# Patient Record
Sex: Female | Born: 1950
Health system: Southern US, Community
[De-identification: ages and names within clinical notes are randomized; demographics above are authoritative.]

## PROBLEM LIST (undated history)

## (undated) DIAGNOSIS — J189 Pneumonia, unspecified organism: Secondary | ICD-10-CM

## (undated) DIAGNOSIS — J42 Unspecified chronic bronchitis: Secondary | ICD-10-CM

## (undated) DIAGNOSIS — C801 Malignant (primary) neoplasm, unspecified: Secondary | ICD-10-CM

## (undated) DIAGNOSIS — I10 Essential (primary) hypertension: Secondary | ICD-10-CM

## (undated) DIAGNOSIS — M1990 Unspecified osteoarthritis, unspecified site: Secondary | ICD-10-CM

## (undated) DIAGNOSIS — C4491 Basal cell carcinoma of skin, unspecified: Secondary | ICD-10-CM

## (undated) DIAGNOSIS — Z9889 Other specified postprocedural states: Secondary | ICD-10-CM

## (undated) DIAGNOSIS — K589 Irritable bowel syndrome without diarrhea: Secondary | ICD-10-CM

## (undated) DIAGNOSIS — R112 Nausea with vomiting, unspecified: Secondary | ICD-10-CM

## (undated) DIAGNOSIS — D219 Benign neoplasm of connective and other soft tissue, unspecified: Secondary | ICD-10-CM

## (undated) DIAGNOSIS — H269 Unspecified cataract: Secondary | ICD-10-CM

## (undated) DIAGNOSIS — M069 Rheumatoid arthritis, unspecified: Secondary | ICD-10-CM

## (undated) HISTORY — DX: Irritable bowel syndrome, unspecified: K58.9

## (undated) HISTORY — DX: Benign neoplasm of connective and other soft tissue, unspecified: D21.9

## (undated) HISTORY — DX: Essential (primary) hypertension: I10

## (undated) HISTORY — DX: Unspecified cataract: H26.9

## (undated) HISTORY — PX: BREAST SURGERY: SHX581

## (undated) HISTORY — PX: KNEE ARTHROSCOPY: SHX127

## (undated) HISTORY — DX: Unspecified osteoarthritis, unspecified site: M19.90

## (undated) HISTORY — PX: FOOT SURGERY: SHX648

## (undated) HISTORY — PX: BASAL CELL CARCINOMA EXCISION: SHX1214

## (undated) HISTORY — PX: COLONOSCOPY: SHX174

## (undated) HISTORY — DX: Malignant (primary) neoplasm, unspecified: C80.1

## (undated) HISTORY — DX: Rheumatoid arthritis, unspecified: M06.9

## (undated) HISTORY — PX: EYE SURGERY: SHX253

## (undated) HISTORY — PX: PELVIC LAPAROSCOPY: SHX162

---

## 1979-05-27 HISTORY — PX: KNEE ARTHROSCOPY: SHX127

## 1990-09-25 HISTORY — PX: BREAST CYST EXCISION: SHX579

## 1995-09-26 HISTORY — PX: TOTAL ABDOMINAL HYSTERECTOMY: SHX209

## 1998-09-08 ENCOUNTER — Other Ambulatory Visit: Admission: RE | Admit: 1998-09-08 | Discharge: 1998-09-08 | Payer: Self-pay | Admitting: Radiology

## 1998-10-04 ENCOUNTER — Other Ambulatory Visit: Admission: RE | Admit: 1998-10-04 | Discharge: 1998-10-04 | Payer: Self-pay | Admitting: Otolaryngology

## 1998-11-22 ENCOUNTER — Other Ambulatory Visit: Admission: RE | Admit: 1998-11-22 | Discharge: 1998-11-22 | Payer: Self-pay | Admitting: Obstetrics and Gynecology

## 1999-12-28 ENCOUNTER — Other Ambulatory Visit: Admission: RE | Admit: 1999-12-28 | Discharge: 1999-12-28 | Payer: Self-pay | Admitting: Obstetrics and Gynecology

## 2000-10-23 ENCOUNTER — Encounter: Payer: Self-pay | Admitting: Internal Medicine

## 2001-06-27 ENCOUNTER — Other Ambulatory Visit: Admission: RE | Admit: 2001-06-27 | Discharge: 2001-06-27 | Payer: Self-pay | Admitting: Obstetrics and Gynecology

## 2002-09-23 ENCOUNTER — Other Ambulatory Visit: Admission: RE | Admit: 2002-09-23 | Discharge: 2002-09-23 | Payer: Self-pay | Admitting: Obstetrics and Gynecology

## 2004-07-25 ENCOUNTER — Other Ambulatory Visit: Admission: RE | Admit: 2004-07-25 | Discharge: 2004-07-25 | Payer: Self-pay | Admitting: Obstetrics and Gynecology

## 2005-07-31 ENCOUNTER — Other Ambulatory Visit: Admission: RE | Admit: 2005-07-31 | Discharge: 2005-07-31 | Payer: Self-pay | Admitting: Obstetrics and Gynecology

## 2005-08-02 ENCOUNTER — Ambulatory Visit: Payer: Self-pay | Admitting: Internal Medicine

## 2006-09-24 ENCOUNTER — Other Ambulatory Visit: Admission: RE | Admit: 2006-09-24 | Discharge: 2006-09-24 | Payer: Self-pay | Admitting: Obstetrics and Gynecology

## 2007-09-26 HISTORY — PX: JOINT REPLACEMENT: SHX530

## 2007-10-21 ENCOUNTER — Other Ambulatory Visit: Admission: RE | Admit: 2007-10-21 | Discharge: 2007-10-21 | Payer: Self-pay | Admitting: Obstetrics and Gynecology

## 2007-12-03 ENCOUNTER — Ambulatory Visit (HOSPITAL_BASED_OUTPATIENT_CLINIC_OR_DEPARTMENT_OTHER): Admission: RE | Admit: 2007-12-03 | Discharge: 2007-12-04 | Payer: Self-pay | Admitting: Orthopedic Surgery

## 2007-12-03 ENCOUNTER — Encounter (INDEPENDENT_AMBULATORY_CARE_PROVIDER_SITE_OTHER): Payer: Self-pay | Admitting: Orthopedic Surgery

## 2008-11-23 ENCOUNTER — Other Ambulatory Visit: Admission: RE | Admit: 2008-11-23 | Discharge: 2008-11-23 | Payer: Self-pay | Admitting: Obstetrics and Gynecology

## 2008-11-23 ENCOUNTER — Ambulatory Visit: Payer: Self-pay | Admitting: Obstetrics and Gynecology

## 2008-11-23 ENCOUNTER — Encounter: Payer: Self-pay | Admitting: Obstetrics and Gynecology

## 2009-07-26 ENCOUNTER — Encounter (INDEPENDENT_AMBULATORY_CARE_PROVIDER_SITE_OTHER): Payer: Self-pay | Admitting: *Deleted

## 2009-07-26 ENCOUNTER — Telehealth: Payer: Self-pay | Admitting: Internal Medicine

## 2009-07-30 ENCOUNTER — Telehealth: Payer: Self-pay | Admitting: Internal Medicine

## 2009-07-30 ENCOUNTER — Ambulatory Visit: Payer: Self-pay | Admitting: Internal Medicine

## 2009-07-30 ENCOUNTER — Telehealth: Payer: Self-pay | Admitting: Physician Assistant

## 2009-07-30 DIAGNOSIS — K589 Irritable bowel syndrome without diarrhea: Secondary | ICD-10-CM | POA: Insufficient documentation

## 2009-07-30 DIAGNOSIS — R110 Nausea: Secondary | ICD-10-CM | POA: Insufficient documentation

## 2009-07-30 DIAGNOSIS — K5900 Constipation, unspecified: Secondary | ICD-10-CM | POA: Insufficient documentation

## 2009-07-30 DIAGNOSIS — K219 Gastro-esophageal reflux disease without esophagitis: Secondary | ICD-10-CM | POA: Insufficient documentation

## 2009-07-30 DIAGNOSIS — R112 Nausea with vomiting, unspecified: Secondary | ICD-10-CM | POA: Insufficient documentation

## 2009-07-30 DIAGNOSIS — R1084 Generalized abdominal pain: Secondary | ICD-10-CM | POA: Insufficient documentation

## 2009-08-02 ENCOUNTER — Encounter: Payer: Self-pay | Admitting: Internal Medicine

## 2009-08-02 DIAGNOSIS — R1013 Epigastric pain: Secondary | ICD-10-CM | POA: Insufficient documentation

## 2009-08-02 LAB — CONVERTED CEMR LAB
AST: 21 units/L (ref 0–37)
Albumin: 3.9 g/dL (ref 3.5–5.2)
Alkaline Phosphatase: 58 units/L (ref 39–117)
Basophils Relative: 0.6 % (ref 0.0–3.0)
Eosinophils Absolute: 0.2 10*3/uL (ref 0.0–0.7)
HCT: 43.3 % (ref 36.0–46.0)
Lymphs Abs: 1.3 10*3/uL (ref 0.7–4.0)
MCHC: 33.8 g/dL (ref 30.0–36.0)
MCV: 106.3 fL — ABNORMAL HIGH (ref 78.0–100.0)
Monocytes Absolute: 0.7 10*3/uL (ref 0.1–1.0)
Neutrophils Relative %: 53.9 % (ref 43.0–77.0)
Platelets: 225 10*3/uL (ref 150.0–400.0)
Potassium: 4.6 meq/L (ref 3.5–5.1)
RBC: 4.07 M/uL (ref 3.87–5.11)
Sodium: 144 meq/L (ref 135–145)
Total Protein: 6.1 g/dL (ref 6.0–8.3)

## 2009-08-05 ENCOUNTER — Ambulatory Visit: Payer: Self-pay | Admitting: Internal Medicine

## 2009-08-24 ENCOUNTER — Ambulatory Visit: Payer: Self-pay | Admitting: Internal Medicine

## 2009-10-23 ENCOUNTER — Emergency Department (HOSPITAL_BASED_OUTPATIENT_CLINIC_OR_DEPARTMENT_OTHER): Admission: EM | Admit: 2009-10-23 | Discharge: 2009-10-23 | Payer: Self-pay | Admitting: Emergency Medicine

## 2010-06-27 ENCOUNTER — Other Ambulatory Visit: Admission: RE | Admit: 2010-06-27 | Discharge: 2010-06-27 | Payer: Self-pay | Admitting: Obstetrics and Gynecology

## 2010-06-27 ENCOUNTER — Ambulatory Visit: Payer: Self-pay | Admitting: Obstetrics and Gynecology

## 2011-02-07 NOTE — Op Note (Signed)
Patricia Long, Patricia Long           ACCOUNT NO.:  [id number]   MEDICAL RECORD NO.:  [id number]          PATIENT TYPE:  AMB   LOCATION:  DSC                          FACILITY:  [facility]   PHYSICIAN:  Katy Fitch. Sypher, M.D. DATE OF BIRTH:  [birth year]   DATE OF PROCEDURE:  12/03/2007  DATE OF DISCHARGE:                               OPERATIVE REPORT   PREOPERATIVE DIAGNOSES:  1. Complex rheumatoid arthritis induced wrist instability, left wrist,      with profound radioscapholunate degenerative arthritis and chronic      ulnocarpal translation.  2. Fracture of left index proximal interphalangeal joint implant      arthroplasty 14 years status post primary procedure with marked      ulnar deviation of proximal interphalangeal joint.   POSTOPERATIVE DIAGNOSES:  1. Complex rheumatoid arthritis induced wrist instability, left wrist,      with profound radioscapholunate degenerative arthritis and chronic      ulnocarpal translation.  2. Fracture of left index proximal interphalangeal joint implant      arthroplasty 14 years status post primary procedure with marked      ulnar deviation of proximal interphalangeal joint.   OPERATION:  1. Removal of 62 year old implant arthroplasty components left index      finger PIP joint with synovectomy and biopsy to rule out silicone      synovitis.  2. Revision implant arthroplasty of left index finger PIP joint with      placement of a size 1 DePuy PIP implant arthroplasty component.  3. Radioscapholunate arthrodesis of unstable left wrist utilizing an      ASIF 1.5-mm blade plate to secure the lunate to the radius and a 30-      mm Mini Acutrak cannulated screw to secure the scaphoid to the      radius with autogenous bone graft harvested from the distal radius      including Lister's tubercle and dorsal cortical cancellus bone.   OPERATIONS:  Patricia Long, M.D.   ASSISTANT:  Patricia Long, P.A.-C.   ANESTHESIA:  Left infraclavicular block  supplemented by IV sedation;  supervising anesthesiologist Dr. Noreene Larsson.   INDICATIONS:  Patricia Long is a 60 year old woman with a 20-year  history of rheumatoid arthritis.   She is status post reconstruction of her left index finger PIP joint  with an implant arthroplasty performed 14 years prior.  She is on long-  term rheumatoid management by Dr. Coral Spikes including methotrexate and  Humira.   Recently, she presented for follow-up evaluation with an obvious  fracture of her index finger PIP implant arthroplasty and a palmar  subluxation of her left wrist.   She was advised to strongly consider proceeding with a revision fusion  of her left index finger PIP joint.   She was so pleased with the motion she had enjoyed for the last 14 years  in her index finger that she requested that we try a second implant  arthroplasty.   I pointed out to her that a second arthroplasty would be very  challenging given her bone changes and the difficulty achieving  collateral ligament stability.  However,  she stated that she would  prefer to attempt this prior to proceeding with a fusion.   Given our longstanding relationship and therapeutic alliance, I advised  her that we would attempt a second arthroplasty at this time.  In  addition to her predicament with the left index finger PIP joint, she  was noted to have profound radioscaphoid and radiolunate arthrosis with  ulnocarpal translation and distal radioulnar joint degenerative changes.   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   After informed consent, she is brought to the operating room at this  time.   PROCEDURE:  Patricia Long was brought to the operating room and  placed in supine position on the operating table.   Following an anesthesia  consult by Dr. Noreene Larsson, an infraclavicular block  was placed in left without complication.   Patricia Long was brought to Room 6 and placed in supine position on  the operating room table, and under Dr. Morley Kos direct supervision,  sedation provided.  The left arm was prepped with Betadine soap solution  and sterilely draped.  A pneumatic tourniquet was applied to the  proximal left brachium.   Following exsanguination of the left arm with Esmarch bandage, arterial  tourniquet inflated to 240 mmHg due to mild systolic hypertension.  The  procedure commenced with resection of prior surgical scar from the index  implant arthroplasty.  The extensor mechanism was exposed and the dorsal  veins protected.  The extensor was split the midline and elevated off  the dorsal capsule of PIP joint, exposing the collateral ligaments.  The  capsule was released, and by flexion of the joint, the implant  components were removed.  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   Meticulous curettage of the proximal and middle phalanges was  accomplished with a micro curette followed by use of a power bur to  reshape the canals of the proximal and middle phalanges to accept a size  1 DePuy PIP implant arthroplasty component.   The components were ultimately placed with no-touch technique followed  by securing the finger in a neutral position with a 0.035-inch Kirschner  wire placed through the flexor sheath.  AP lateral C-arm images were  used to control placement of the Kirschner wire and position the PIP  joint.   After this was secured, the capsule and extensor was repaired with a  series of mattress sutures of 3-0 Ethibond.   The skin was then repaired with intradermal 3-0 Prolene and Steri-  Strips.   The wound was then dressed with sterile gauze and Coban with a sterile  tongue depressor to protect the Kirschner wire and PIP construct.    Attention was directed to the wrist.  A 5-cm dorsal incision was  fashioned just ulnar to Lister's tubercle.  The extensor retinaculum  exposed and split along the radial margin of the fourth dorsal  compartment.  The third dorsal compartment unroofed, extensor pollicis  longus retracted, and Lister's tubercle and the bone beneath it excised  for a bone graft.  The capsule of the radiocarpal articulation was  opened, followed by exposure of the radial scapholunate articulation.  After complete synovectomy, the adjacent surfaces of the scaphoid lunate  and radius were cleared of all hyaline cartilage with a curette followed  by feathering with a small osteotome and use of a power bur on the  concave surfaces of the carpal bones down to bleeding cancellus bone.   The ulnocarpal translation was corrected, and a 1.5-mm ASIF blade plate  was used to reduce the lunate onto the lunate facet of the distal radius  followed by placement of multiple screws in the radius, correcting both  the ulnar translation of the lunate and creating compression of the  lunate against the distal radius.  The scaphoid was then secured to the  scaphoid facet of the radius with a 30-mm Mini Acutrak cannulated screw.  The distal radioulnar joint provided stability despite its arthritic  predicament.  We will preserve the distal ulna in the interim.   After completion of the arthrodesis of the radial scapholunate  articulation, the tourniquet was released at 2 hours.  Bone graft was  packed between the scaphoid and the radius prior to compression of the  Acutrak screw.   The capsule was then repaired with mattress suture of 3-0 Ethibond  followed by repair of the extensor retinaculum with mattress suture and  figure-of-eight suture of 3-0 Ethibond.  The wounds were repaired with  subdermal sutures of 4-0 Vicryl and intradermal 3-0 Prolene with Steri-  Strips.   Ms. Whittinghill was placed in a voluminous gauze  dressing with a sugar-  tong splint maintaining the forearm in supination.  The wrist was  maintained in neutral.   For aftercare, she will be admitted to recovery care center for  observation of vital signs and appropriate antibiotics in the form of  Ancef 1 g IV q.8h. for 24 hours.   She will be discharged the morning of December 04, 2007 to home.      Katy Fitch Sypher, M.D.  Electronically Signed     RVS/MEDQ  D:  12/03/2007  T:  12/04/2007  Job:  [id number]   cc:   Katy Fitch. Sypher, M.D.

## 2011-07-12 ENCOUNTER — Other Ambulatory Visit: Payer: Self-pay | Admitting: Obstetrics and Gynecology

## 2011-09-04 ENCOUNTER — Encounter: Payer: Self-pay | Admitting: Obstetrics and Gynecology

## 2011-10-10 ENCOUNTER — Other Ambulatory Visit: Payer: Self-pay | Admitting: Obstetrics and Gynecology

## 2011-10-10 NOTE — Telephone Encounter (Signed)
I called patient and we scheduled her CE for 11/13/11.

## 2011-11-14 ENCOUNTER — Other Ambulatory Visit (HOSPITAL_COMMUNITY)
Admission: RE | Admit: 2011-11-14 | Discharge: 2011-11-14 | Disposition: A | Discharge status: Home / Self Care | Payer: 59 | Source: Ambulatory Visit | Attending: Obstetrics and Gynecology | Admitting: Obstetrics and Gynecology

## 2011-11-14 ENCOUNTER — Encounter: Payer: Self-pay | Admitting: Obstetrics and Gynecology

## 2011-11-14 ENCOUNTER — Ambulatory Visit (INDEPENDENT_AMBULATORY_CARE_PROVIDER_SITE_OTHER): Payer: 59 | Admitting: Obstetrics and Gynecology

## 2011-11-14 VITALS — BP 124/80 | Ht 67.0 in | Wt 138.0 lb

## 2011-11-14 DIAGNOSIS — Z01419 Encounter for gynecological examination (general) (routine) without abnormal findings: Secondary | ICD-10-CM | POA: Insufficient documentation

## 2011-11-14 LAB — URINALYSIS W MICROSCOPIC + REFLEX CULTURE
Bilirubin Urine: NEGATIVE
Glucose, UA: NEGATIVE mg/dL
Hgb urine dipstick: NEGATIVE
Protein, ur: NEGATIVE mg/dL
pH: 7 (ref 5.0–8.0)

## 2011-11-14 MED ORDER — ESTRADIOL 1 MG PO TABS: 1.0000 mg | ORAL_TABLET | Freq: Every day | ORAL | Status: DC

## 2011-11-14 NOTE — Progress Notes (Signed)
Patient came to see me today for her annual GYN exam. She remains on oral estrogen for significant menopausal symptoms. She also has vaginal dryness but did not like vaginal estrogen products and is doing all right without them. She is up-to-date on mammograms. She does her bone densities through her PCPs office. She does her lab work through her PCPs office. She is having no vaginal bleeding. She is having no pelvic pain.  HEENT: Within normal limits.Kennon Portela present.Neck: No masses. Supraclavicular lymph nodes: Not enlarged. Breasts: Examined in both sitting and lying position. Symmetrical without skin changes or masses. Abdomen: Soft no masses guarding or rebound. No hernias. Pelvic: External within normal limits. BUS within normal limits. Vaginal examination shows good estrogen effect, no cystocele enterocele or rectocele. Cervix and uterus absent. Adnexa within normal limits. Rectovaginal confirmatory. Extremities within normal limits.  Assessment: Menopausal symptoms  Plan: Continue oral estradiol. Discussed patch for safety. Patient declined. Continue yearly mammograms.

## 2012-02-01 DIAGNOSIS — M8580 Other specified disorders of bone density and structure, unspecified site: Secondary | ICD-10-CM | POA: Insufficient documentation

## 2012-09-10 ENCOUNTER — Telehealth: Payer: Self-pay | Admitting: *Deleted

## 2012-09-10 MED ORDER — ESTRADIOL 1 MG PO TABS: 1.0000 mg | ORAL_TABLET | Freq: Every day | ORAL | Status: DC

## 2012-09-10 NOTE — Telephone Encounter (Signed)
Pt has new pharmacy and will need 90 day supply sent to [facility]. Estradiol 1 mg sent.

## 2012-09-12 ENCOUNTER — Encounter: Payer: Self-pay | Admitting: Obstetrics and Gynecology

## 2012-12-03 ENCOUNTER — Other Ambulatory Visit: Payer: Self-pay | Admitting: Obstetrics and Gynecology

## 2013-08-14 DIAGNOSIS — Z79899 Other long term (current) drug therapy: Secondary | ICD-10-CM | POA: Insufficient documentation

## 2013-08-29 ENCOUNTER — Ambulatory Visit (INDEPENDENT_AMBULATORY_CARE_PROVIDER_SITE_OTHER): Payer: 59 | Admitting: Women's Health

## 2013-08-29 ENCOUNTER — Encounter: Payer: Self-pay | Admitting: Women's Health

## 2013-08-29 VITALS — BP 132/82 | Ht 67.0 in | Wt 132.0 lb

## 2013-08-29 DIAGNOSIS — M069 Rheumatoid arthritis, unspecified: Secondary | ICD-10-CM | POA: Insufficient documentation

## 2013-08-29 DIAGNOSIS — Z79890 Hormone replacement therapy (postmenopausal): Secondary | ICD-10-CM

## 2013-08-29 DIAGNOSIS — Z01419 Encounter for gynecological examination (general) (routine) without abnormal findings: Secondary | ICD-10-CM

## 2013-08-29 MED ORDER — ESTRADIOL 1 MG PO TABS: 1.0000 mg | ORAL_TABLET | Freq: Every day | ORAL | Status: DC

## 2013-08-29 NOTE — Progress Notes (Signed)
Patricia Long 12/14/1950 [id number]    History:    The patient presents for annual exam.  TAH with BSO for fibroids 1997 on Estrace 1 mg daily. Estrace helps with rest, dryness, and mood. 1992 negative breast biopsy. DEXA done through primary care. Negative colonoscopy. Has rheumatoid arthritis has had pneumonia vaccine unable to take zostavac.  Past medical history, past surgical history, family history and social history were all reviewed and documented in the EPIC chart. Sells medical equipment to hospitals. Parents heart disease.  ROS:  A  ROS was performed and pertinent positives and negatives are included in the history.  Exam:  Filed Vitals:   08/29/13 1033  BP: 132/82    General appearance:  Normal Head/Neck:  Normal, without cervical or supraclavicular adenopathy. Thyroid:  Symmetrical, normal in size, without palpable masses or nodularity. Respiratory  Effort:  Normal  Auscultation:  Clear without wheezing or rhonchi Cardiovascular  Auscultation:  Regular rate, without rubs, murmurs or gallops  Edema/varicosities:  Not grossly evident Abdominal  Soft,nontender, without masses, guarding or rebound.  Liver/spleen:  No organomegaly noted  Hernia:  None appreciated  Skin  Inspection:  Grossly normal  Palpation:  Grossly normal Neurologic/psychiatric  Orientation:  Normal with appropriate conversation.  Mood/affect:  Normal  Genitourinary    Breasts: Examined lying and sitting.     Right: Without masses, retractions, discharge or axillary adenopathy.     Left: Without masses, retractions, discharge or axillary adenopathy.   Inguinal/mons:  Normal without inguinal adenopathy  External genitalia:  Normal  BUS/Urethra/Skene's glands:  Normal  Bladder:  Normal  Vagina:  Normal  Cervix:  Absent  Uterus:  Absent  Adnexa/parametria:     Rt: Without masses or tenderness.   Lt: Without masses or tenderness.  Anus and perineum: Normal  Digital rectal exam: Normal  sphincter tone without palpated masses or tenderness  Assessment/Plan:  62 y.o.MWF G0  for annual exam with no complaints.  TAH with BSO/fibroids/HRT Rheumatoid arthritis-primary care manages labs and meds  Plan: Estrace 1 mg daily prescription, proper use, risk for blood clots, strokes, breast cancer reviewed. Reviewed decreasing dosage will try. SBE's, continue annual mammogram, calcium rich diet, vitamin D 2000 daily encouraged. Home safety and fall prevention discussed.UAHarrington Challenger [facility], 12:58 PM 08/29/2013

## 2013-08-29 NOTE — Patient Instructions (Signed)
Health Recommendations for Postmenopausal Women Respected and ongoing research has looked at the most common causes of death, disability, and poor quality of life in postmenopausal women. The causes include heart disease, diseases of blood vessels, diabetes, depression, cancer, and bone loss (osteoporosis). Many things can be done to help lower the chances of developing these and other common problems: CARDIOVASCULAR DISEASE Heart Disease: A heart attack is a medical emergency. Know the signs and symptoms of a heart attack. Below are things women can do to reduce their risk for heart disease.   Do not smoke. If you smoke, quit.  Aim for a healthy weight. Being overweight causes many preventable deaths. Eat a healthy and balanced diet and drink an adequate amount of liquids.  Get moving. Make a commitment to be more physically active. Aim for 30 minutes of activity on most, if not all days of the week.  Eat for heart health. Choose a diet that is low in saturated fat and cholesterol and eliminate trans fat. Include whole grains, vegetables, and fruits. Read and understand the labels on food containers before buying.  Know your numbers. Ask your caregiver to check your blood pressure, cholesterol (total, HDL, LDL, triglycerides) and blood glucose. Work with your caregiver on improving your entire clinical picture.  High blood pressure. Limit or stop your table salt intake (try salt substitute and food seasonings). Avoid salty foods and drinks. Read labels on food containers before buying. Eating well and exercising can help control high blood pressure. STROKE  Stroke is a medical emergency. Stroke may be the result of a blood clot in a blood vessel in the brain or by a brain hemorrhage (bleeding). Know the signs and symptoms of a stroke. To lower the risk of developing a stroke:  Avoid fatty foods.  Quit smoking.  Control your diabetes, blood pressure, and irregular heart rate. THROMBOPHLEBITIS  (BLOOD CLOT) OF THE LEG  Becoming overweight and leading a stationary lifestyle may also contribute to developing blood clots. Controlling your diet and exercising will help lower the risk of developing blood clots. CANCER SCREENING  Breast Cancer: Take steps to reduce your risk of breast cancer.  You should practice "breast self-awareness." This means understanding the normal appearance and feel of your breasts and should include breast self-examination. Any changes detected, no matter how small, should be reported to your caregiver.  After age 40, you should have a clinical breast exam (CBE) every year.  Starting at age 40, you should consider having a mammogram (breast X-ray) every year.  If you have a family history of breast cancer, talk to your caregiver about genetic screening.  If you are at high risk for breast cancer, talk to your caregiver about having an MRI and a mammogram every year.  Intestinal or Stomach Cancer: Tests to consider are a rectal exam, fecal occult blood, sigmoidoscopy, and colonoscopy. Women who are high risk may need to be screened at an earlier age and more often.  Cervical Cancer:  Beginning at age 30, you should have a Pap test every 3 years as long as the past 3 Pap tests have been normal.  If you have had past treatment for cervical cancer or a condition that could lead to cancer, you need Pap tests and screening for cancer for at least 20 years after your treatment.  If you had a hysterectomy for a problem that was not cancer or a condition that could lead to cancer, then you no longer need Pap tests.    If you are between ages 65 and 70, and you have had normal Pap tests going back 10 years, you no longer need Pap tests.  If Pap tests have been discontinued, risk factors (such as a new sexual partner) need to be reassessed to determine if screening should be resumed.  Some medical problems can increase the chance of getting cervical cancer. In these  cases, your caregiver may recommend more frequent screening and Pap tests.  Uterine Cancer: If you have vaginal bleeding after reaching menopause, you should notify your caregiver.  Ovarian cancer: Other than yearly pelvic exams, there are no reliable tests available to screen for ovarian cancer at this time except for yearly pelvic exams.  Lung Cancer: Yearly chest X-rays can detect lung cancer and should be done on high risk women, such as cigarette smokers and women with chronic lung disease (emphysema).  Skin Cancer: A complete body skin exam should be done at your yearly examination. Avoid overexposure to the sun and ultraviolet light lamps. Use a strong sun block cream when in the sun. All of these things are important in lowering the risk of skin cancer. MENOPAUSE Menopause Symptoms: Hormone therapy products are effective for treating symptoms associated with menopause:  Moderate to severe hot flashes.  Night sweats.  Mood swings.  Headaches.  Tiredness.  Loss of sex drive.  Insomnia.  Other symptoms. Hormone replacement carries certain risks, especially in older women. Women who use or are thinking about using estrogen or estrogen with progestin treatments should discuss that with their caregiver. Your caregiver will help you understand the benefits and risks. The ideal dose of hormone replacement therapy is not known. The Food and Drug Administration (FDA) has concluded that hormone therapy should be used only at the lowest doses and for the shortest amount of time to reach treatment goals.  OSTEOPOROSIS Protecting Against Bone Loss and Preventing Fracture: If you use hormone therapy for prevention of bone loss (osteoporosis), the risks for bone loss must outweigh the risk of the therapy. Ask your caregiver about other medications known to be safe and effective for preventing bone loss and fractures. To guard against bone loss or fractures, the following is recommended:  If  you are less than age 50, take 1000 mg of calcium and at least 600 mg of Vitamin D per day.  If you are greater than age 50 but less than age 70, take 1200 mg of calcium and at least 600 mg of Vitamin D per day.  If you are greater than age 70, take 1200 mg of calcium and at least 800 mg of Vitamin D per day. Smoking and excessive alcohol intake increases the risk of osteoporosis. Eat foods rich in calcium and vitamin D and do weight bearing exercises several times a week as your caregiver suggests. DIABETES Diabetes Melitus: If you have Type I or Type 2 diabetes, you should keep your blood sugar under control with diet, exercise and recommended medication. Avoid too many sweets, starchy and fatty foods. Being overweight can make control more difficult. COGNITION AND MEMORY Cognition and Memory: Menopausal hormone therapy is not recommended for the prevention of cognitive disorders such as Alzheimer's disease or memory loss.  DEPRESSION  Depression may occur at any age, but is common in elderly women. The reasons may be because of physical, medical, social (loneliness), or financial problems and needs. If you are experiencing depression because of medical problems and control of symptoms, talk to your caregiver about this. Physical activity and   exercise may help with mood and sleep. Community and volunteer involvement may help your sense of value and worth. If you have depression and you feel that the problem is getting worse or becoming severe, talk to your caregiver about treatment options that are best for you. ACCIDENTS  Accidents are common and can be serious in the elderly woman. Prepare your house to prevent accidents. Eliminate throw rugs, place hand bars in the bath, shower and toilet areas. Avoid wearing high heeled shoes or walking on wet, snowy, and icy areas. Limit or stop driving if you have vision or hearing problems, or you feel you are unsteady with you movements and  reflexes. HEPATITIS C Hepatitis C is a type of viral infection affecting the liver. It is spread mainly through contact with blood from an infected person. It can be treated, but if left untreated, it can lead to severe liver damage over years. Many people who are infected do not know that the virus is in their blood. If you are a "baby-boomer", it is recommended that you have one screening test for Hepatitis C. IMMUNIZATIONS  Several immunizations are important to consider having during your senior years, including:   Tetanus, diptheria, and pertussis booster shot.  Influenza every year before the flu season begins.  Pneumonia vaccine.  Shingles vaccine.  Others as indicated based on your specific needs. Talk to your caregiver about these. Document Released: 11/03/2005 Document Revised: 08/28/2012 Document Reviewed: 06/29/2008 [employer] Patient Information 2014 [employer].  

## 2013-09-03 ENCOUNTER — Encounter: Payer: Self-pay | Admitting: Women's Health

## 2014-07-23 ENCOUNTER — Telehealth: Payer: Self-pay | Admitting: Internal Medicine

## 2014-07-23 NOTE — Telephone Encounter (Signed)
Spoke with patient and she states she has had abdominal cramping for several weeks. Yesterday, she started having abdominal pain below belly button. She has a history of constipation but it is not worse that usual. Denies diarrhea, vomiting, nausea. Offered OV with extender. She cannot come today. Scheduled tomorrow with Patricia Savoy, NP at 3:00 PM.

## 2014-07-24 ENCOUNTER — Encounter: Payer: Self-pay | Admitting: Nurse Practitioner

## 2014-07-24 ENCOUNTER — Other Ambulatory Visit (INDEPENDENT_AMBULATORY_CARE_PROVIDER_SITE_OTHER): Payer: Medicare Other

## 2014-07-24 ENCOUNTER — Ambulatory Visit (INDEPENDENT_AMBULATORY_CARE_PROVIDER_SITE_OTHER): Payer: Medicare Other | Admitting: Nurse Practitioner

## 2014-07-24 VITALS — BP 138/68 | HR 64 | Ht 66.5 in | Wt 134.4 lb

## 2014-07-24 DIAGNOSIS — G8929 Other chronic pain: Secondary | ICD-10-CM

## 2014-07-24 DIAGNOSIS — R1084 Generalized abdominal pain: Secondary | ICD-10-CM

## 2014-07-24 DIAGNOSIS — R1031 Right lower quadrant pain: Secondary | ICD-10-CM

## 2014-07-24 DIAGNOSIS — K5900 Constipation, unspecified: Secondary | ICD-10-CM

## 2014-07-24 DIAGNOSIS — R1032 Left lower quadrant pain: Secondary | ICD-10-CM

## 2014-07-24 LAB — URINALYSIS
BILIRUBIN URINE: NEGATIVE
Hgb urine dipstick: NEGATIVE
Ketones, ur: NEGATIVE
Leukocytes, UA: NEGATIVE
NITRITE: NEGATIVE
PH: 5.5 (ref 5.0–8.0)
Specific Gravity, Urine: 1.015 (ref 1.000–1.030)
TOTAL PROTEIN, URINE-UPE24: NEGATIVE
Urine Glucose: NEGATIVE
Urobilinogen, UA: 0.2 (ref 0.0–1.0)

## 2014-07-24 LAB — CBC WITH DIFFERENTIAL/PLATELET
BASOS PCT: 1.1 % (ref 0.0–3.0)
Basophils Absolute: 0.1 10*3/uL (ref 0.0–0.1)
EOS ABS: 0.2 10*3/uL (ref 0.0–0.7)
Eosinophils Relative: 2.9 % (ref 0.0–5.0)
HCT: 45.5 % (ref 36.0–46.0)
Hemoglobin: 15.1 g/dL — ABNORMAL HIGH (ref 12.0–15.0)
LYMPHS PCT: 25.3 % (ref 12.0–46.0)
Lymphs Abs: 1.7 10*3/uL (ref 0.7–4.0)
MCHC: 33.1 g/dL (ref 30.0–36.0)
MCV: 103.1 fl — ABNORMAL HIGH (ref 78.0–100.0)
MONOS PCT: 14.3 % — AB (ref 3.0–12.0)
Monocytes Absolute: 0.9 10*3/uL (ref 0.1–1.0)
NEUTROS PCT: 56.4 % (ref 43.0–77.0)
Neutro Abs: 3.7 10*3/uL (ref 1.4–7.7)
Platelets: 241 10*3/uL (ref 150.0–400.0)
RBC: 4.42 Mil/uL (ref 3.87–5.11)
RDW: 13.4 % (ref 11.5–15.5)
WBC: 6.6 10*3/uL (ref 4.0–10.5)

## 2014-07-24 LAB — BASIC METABOLIC PANEL
BUN: 20 mg/dL (ref 6–23)
CHLORIDE: 103 meq/L (ref 96–112)
CO2: 30 mEq/L (ref 19–32)
Calcium: 9.1 mg/dL (ref 8.4–10.5)
Creatinine, Ser: 0.7 mg/dL (ref 0.4–1.2)
GFR: 87.72 mL/min (ref >60.00)
Glucose, Bld: 84 mg/dL (ref 70–99)
POTASSIUM: 4.4 meq/L (ref 3.5–5.1)
Sodium: 139 mEq/L (ref 135–145)

## 2014-07-24 MED ORDER — DICYCLOMINE HCL 10 MG PO CAPS: ORAL_CAPSULE | ORAL | Status: DC

## 2014-07-24 NOTE — Patient Instructions (Addendum)
Please go to the basement level to have your labs drawn.  Get a bottle of magnesium citrate, Drink 1/2 of the bottle and then 24 hours later you can drink the remainder of the bottle.   We sent a prescription for Dicyclomine ( Bentyl) , take 1 tab twice daily as needed for cramping, spasms.  We will call you with test results and at that time we will get a condition update from you.

## 2014-07-26 ENCOUNTER — Encounter: Payer: Self-pay | Admitting: Nurse Practitioner

## 2014-07-26 NOTE — Progress Notes (Signed)
HPI :  Patient is a 63 year old female known to Dr. Henrene Pastor. In 2010 she had an EGD and colonoscopy for evaluation of nausea, constipation and abdominal pain. EGD revealed only esophagitis, colonoscopy only internal hemorrhoids. . No polyps   Patient comes in for a three week history of almost constant lower abdominal cramps. Periodically cramps escalate into pain for unclear reasons. No chills. No dysuria. She has mild intermittent nausea. She is s/p remote TAH for fibroids. Patient has chronic constipation but it doesn't usually have associated abdominal pain. If Patient tried to have BM everyday but will take MOM if she skips a day, otherwise stool will be very hard. Patient doesn't feel she is currently constipated.   Past Medical History  Diagnosis Date  . Endometriosis   . Fibroid   . Rheumatoid arthritis(714.0)   . Irritable bowel syndrome     Family History  Problem Relation Age of Onset  . Heart disease Mother   . Heart disease Father   . Colon cancer Maternal Aunt   . Colon cancer Paternal Uncle   . Colon polyps Neg Hx   . Diabetes Neg Hx   . Kidney disease Neg Hx   . Esophageal cancer Neg Hx    History  Substance Use Topics  . Smoking status: Never Smoker   . Smokeless tobacco: Never Used  . Alcohol Use: 0.5 oz/week    1 drink(s) per week     Comment: OCCASIONAL   Current Outpatient Prescriptions  Medication Sig Dispense Refill  . adalimumab (HUMIRA) 40 MG/0.8ML injection Inject 40 mg into the skin every 7 (seven) days.    . calcium carbonate (OS-CAL) 600 MG TABS Take 600 mg by mouth 2 (two) times daily with a meal.    . cholecalciferol (VITAMIN D) 1000 UNITS tablet Take 1,000 Units by mouth daily.    Marland Kitchen estradiol (ESTRACE) 1 MG tablet Take 1 tablet (1 mg total) by mouth daily. 90 tablet 4  . hydroxychloroquine (PLAQUENIL) 200 MG tablet Take 200 mg by mouth daily.    . methotrexate (RHEUMATREX) 5 MG tablet Caution: Chemotherapy. Protect from light. Pt gets 0.40  mL SubQ injections weekly for Rheumatoid Arthritis    . pantoprazole (PROTONIX) 40 MG tablet Take 40 mg by mouth daily.    Marland Kitchen dicyclomine (BENTYL) 10 MG capsule Take 1 tab twice daily as needed for cramping, spasms. 60 capsule 0   No current facility-administered medications for this visit.   Allergies  Allergen Reactions  . Codeine   . Prochlorperazine Edisylate     compazine  . Tape Rash   Review of Systems: All systems reviewed and negative except where noted in HPI.   Physical Exam: BP 138/68 mmHg  Pulse 64  Ht 5' 6.5" (1.689 m)  Wt 134 lb 6 oz (60.952 kg)  BMI 21.37 kg/m2 Constitutional: Pleasant,well-developed, white female in no acute distress. HEENT: Normocephalic and atraumatic. Conjunctivae are normal. No scleral icterus. Neck supple.  Cardiovascular: Normal rate, regular rhythm.  Pulmonary/chest: Effort normal and breath sounds normal. No wheezing, rales or rhonchi. Abdominal: Soft, nondistended, nontender. Bowel sounds active throughout. There are no masses palpable. No hepatomegaly. Extremities: no edema Lymphadenopathy: No cervical adenopathy noted. Neurological: Alert and oriented to person place and time. Skin: Skin is warm and dry. No rashes noted. Psychiatric: Normal mood and affect. Behavior is normal.   ASSESSMENT AND PLAN:  39. 63 year old female with 3 week history of lower abdominal cramps unrelated to meals  or defecation. No known history of diverticular disease but still not excluded with certainty. She is s/p remote TAH. Abdominal exam is not overly concerning. She has a history of chronic constipation and this should be excluded as source of pain. Will purge bowels with Mg+ citrate, trial of bentyl 10mg  bid. Will check u/a and basic labs.. Will call patient with lab results in a few days. If symptoms persist she may need CTscan.   2. RA, on Humira, Plaquenil, Methotrexate

## 2014-07-27 ENCOUNTER — Encounter: Payer: Self-pay | Admitting: Nurse Practitioner

## 2014-07-27 NOTE — Progress Notes (Signed)
Agree with initial assessment and plans. Nevin Bloodgood to follow-up on results as noted

## 2014-07-28 ENCOUNTER — Encounter: Payer: Self-pay | Admitting: *Deleted

## 2014-07-28 ENCOUNTER — Other Ambulatory Visit: Payer: Self-pay | Admitting: *Deleted

## 2014-07-28 DIAGNOSIS — R1032 Left lower quadrant pain: Secondary | ICD-10-CM

## 2014-07-30 ENCOUNTER — Ambulatory Visit (INDEPENDENT_AMBULATORY_CARE_PROVIDER_SITE_OTHER)
Admission: RE | Admit: 2014-07-30 | Discharge: 2014-07-30 | Disposition: A | Discharge status: Home / Self Care | Payer: Medicare Other | Source: Ambulatory Visit | Attending: Nurse Practitioner | Admitting: Nurse Practitioner

## 2014-07-30 DIAGNOSIS — R1032 Left lower quadrant pain: Secondary | ICD-10-CM

## 2014-07-30 MED ORDER — IOHEXOL 300 MG/ML  SOLN
100.0000 mL | Freq: Once | INTRAMUSCULAR | Status: AC | PRN
  Administered 2014-07-30: 100 mL via INTRAVENOUS

## 2014-09-09 ENCOUNTER — Telehealth: Payer: Self-pay | Admitting: *Deleted

## 2014-09-09 ENCOUNTER — Encounter: Payer: Self-pay | Admitting: Women's Health

## 2014-09-09 ENCOUNTER — Ambulatory Visit (INDEPENDENT_AMBULATORY_CARE_PROVIDER_SITE_OTHER): Payer: Medicare Other | Admitting: Women's Health

## 2014-09-09 VITALS — BP 132/80 | Ht 68.0 in | Wt 134.0 lb

## 2014-09-09 DIAGNOSIS — Z79890 Hormone replacement therapy (postmenopausal): Secondary | ICD-10-CM

## 2014-09-09 MED ORDER — ESTRADIOL 1 MG PO TABS: 1.0000 mg | ORAL_TABLET | Freq: Every day | ORAL | Status: DC

## 2014-09-09 NOTE — Telephone Encounter (Signed)
Prior authozation for estradiol 1 mg faxed to [employer] [place], will wait for response.

## 2014-09-09 NOTE — Progress Notes (Signed)
Patricia Long Patricia Long Jun 07, 1951 [id number]    History:    Presents for breast and pelvic exam. 1997 TAH with BSO for fibroids on Estrace 1 mg daily. Normal Pap and mammogram history. 1992 negative breast biopsy. Has had Pneumovax, unable to take Zostavax due RA. DEXA done at primary care/rheumatologist. Negative colonoscopy 2010.  Past medical history, past surgical history, family history and social history were all reviewed and documented in the EPIC chart. Recently retired. Weekend home at [facility].  ROS:  A  12 point ROS was performed and pertinent positives and negatives are included.  Exam:  Filed Vitals:   09/09/14 0935  BP: 132/80    General appearance:  Normal Thyroid:  Symmetrical, normal in size, without palpable masses or nodularity. Respiratory  Auscultation:  Clear without wheezing or rhonchi Cardiovascular  Auscultation:  Regular rate, without rubs, murmurs or gallops  Edema/varicosities:  Not grossly evident Abdominal  Soft,nontender, without masses, guarding or rebound.  Liver/spleen:  No organomegaly noted  Hernia:  None appreciated  Skin  Inspection:  Grossly normal   Breasts: Examined lying and sitting.     Right: Without masses, retractions, discharge or axillary adenopathy.     Left: Without masses, retractions, discharge or axillary adenopathy. Gentitourinary   Inguinal/mons:  Normal without inguinal adenopathy  External genitalia:  Normal  BUS/Urethra/Skene's glands:  Normal  Vagina:  Normal  Cervix and uterus absent  Adnexa/parametria:     Rt: Without masses or tenderness.   Lt: Without masses or tenderness.  Anus and perineum: Normal  Digital rectal exam: Normal sphincter tone without palpated masses or tenderness  Assessment/Plan:  63 y.o. MWF G0 for breast and pelvic exam with no complaints RA - rheumatologist manages labs, DEXA and meds  TAH with BSO/ fibroids on Estrace  Plan: HRT/women's health initiative reviewed less is best  for shortest amount of time, reviewed taking less Estrace, will continue Estrace 1mg  will cut in half, states has had numerous hot flashes and joint pain when has stopped in the past. Risks of blood clots, strokes, breast cancer reviewed. SBE's, continue annual mammogram, 3-D tomography history of dense breasts. Exercise as able, calcium rich diet, had vitamin D level checked at next office visit with primary care. UAHuel Cote [facility], 12:28 PM 09/09/2014

## 2014-09-09 NOTE — Patient Instructions (Signed)
Health Recommendations for Postmenopausal Women Respected and ongoing research has looked at the most common causes of death, disability, and poor quality of life in postmenopausal women. The causes include heart disease, diseases of blood vessels, diabetes, depression, cancer, and bone loss (osteoporosis). Many things can be done to help lower the chances of developing these and other common problems. CARDIOVASCULAR DISEASE Heart Disease: A heart attack is a medical emergency. Know the signs and symptoms of a heart attack. Below are things women can do to reduce their risk for heart disease.   Do not smoke. If you smoke, quit.  Aim for a healthy weight. Being overweight causes many preventable deaths. Eat a healthy and balanced diet and drink an adequate amount of liquids.  Get moving. Make a commitment to be more physically active. Aim for 30 minutes of activity on most, if not all days of the week.  Eat for heart health. Choose a diet that is low in saturated fat and cholesterol and eliminate trans fat. Include whole grains, vegetables, and fruits. Read and understand the labels on food containers before buying.  Know your numbers. Ask your caregiver to check your blood pressure, cholesterol (total, HDL, LDL, triglycerides) and blood glucose. Work with your caregiver on improving your entire clinical picture.  High blood pressure. Limit or stop your table salt intake (try salt substitute and food seasonings). Avoid salty foods and drinks. Read labels on food containers before buying. Eating well and exercising can help control high blood pressure. STROKE  Stroke is a medical emergency. Stroke may be the result of a blood clot in a blood vessel in the brain or by a brain hemorrhage (bleeding). Know the signs and symptoms of a stroke. To lower the risk of developing a stroke:  Avoid fatty foods.  Quit smoking.  Control your diabetes, blood pressure, and irregular heart rate. THROMBOPHLEBITIS  (BLOOD CLOT) OF THE LEG  Becoming overweight and leading a stationary lifestyle may also contribute to developing blood clots. Controlling your diet and exercising will help lower the risk of developing blood clots. CANCER SCREENING  Breast Cancer: Take steps to reduce your risk of breast cancer.  You should practice "breast self-awareness." This means understanding the normal appearance and feel of your breasts and should include breast self-examination. Any changes detected, no matter how small, should be reported to your caregiver.  After age 40, you should have a clinical breast exam (CBE) every year.  Starting at age 40, you should consider having a mammogram (breast X-ray) every year.  If you have a family history of breast cancer, talk to your caregiver about genetic screening.  If you are at high risk for breast cancer, talk to your caregiver about having an MRI and a mammogram every year.  Intestinal or Stomach Cancer: Tests to consider are a rectal exam, fecal occult blood, sigmoidoscopy, and colonoscopy. Women who are high risk may need to be screened at an earlier age and more often.  Cervical Cancer:  Beginning at age 30, you should have a Pap test every 3 years as long as the past 3 Pap tests have been normal.  If you have had past treatment for cervical cancer or a condition that could lead to cancer, you need Pap tests and screening for cancer for at least 20 years after your treatment.  If you had a hysterectomy for a problem that was not cancer or a condition that could lead to cancer, then you no longer need Pap tests.    If you are between ages 65 and 70, and you have had normal Pap tests going back 10 years, you no longer need Pap tests.  If Pap tests have been discontinued, risk factors (such as a new sexual partner) need to be reassessed to determine if screening should be resumed.  Some medical problems can increase the chance of getting cervical cancer. In these  cases, your caregiver may recommend more frequent screening and Pap tests.  Uterine Cancer: If you have vaginal bleeding after reaching menopause, you should notify your caregiver.  Ovarian Cancer: Other than yearly pelvic exams, there are no reliable tests available to screen for ovarian cancer at this time except for yearly pelvic exams.  Lung Cancer: Yearly chest X-rays can detect lung cancer and should be done on high risk women, such as cigarette smokers and women with chronic lung disease (emphysema).  Skin Cancer: A complete body skin exam should be done at your yearly examination. Avoid overexposure to the sun and ultraviolet light lamps. Use a strong sun block cream when in the sun. All of these things are important for lowering the risk of skin cancer. MENOPAUSE Menopause Symptoms: Hormone therapy products are effective for treating symptoms associated with menopause:  Moderate to severe hot flashes.  Night sweats.  Mood swings.  Headaches.  Tiredness.  Loss of sex drive.  Insomnia.  Other symptoms. Hormone replacement carries certain risks, especially in older women. Women who use or are thinking about using estrogen or estrogen with progestin treatments should discuss that with their caregiver. Your caregiver will help you understand the benefits and risks. The ideal dose of hormone replacement therapy is not known. The Food and Drug Administration (FDA) has concluded that hormone therapy should be used only at the lowest doses and for the shortest amount of time to reach treatment goals.  OSTEOPOROSIS Protecting Against Bone Loss and Preventing Fracture If you use hormone therapy for prevention of bone loss (osteoporosis), the risks for bone loss must outweigh the risk of the therapy. Ask your caregiver about other medications known to be safe and effective for preventing bone loss and fractures. To guard against bone loss or fractures, the following is recommended:  If  you are younger than age 50, take 1000 mg of calcium and at least 600 mg of Vitamin D per day.  If you are older than age 50 but younger than age 70, take 1200 mg of calcium and at least 600 mg of Vitamin D per day.  If you are older than age 70, take 1200 mg of calcium and at least 800 mg of Vitamin D per day. Smoking and excessive alcohol intake increases the risk of osteoporosis. Eat foods rich in calcium and vitamin D and do weight bearing exercises several times a week as your caregiver suggests. DIABETES Diabetes Mellitus: If you have type I or type 2 diabetes, you should keep your blood sugar under control with diet, exercise, and recommended medication. Avoid starchy and fatty foods, and too many sweets. Being overweight can make diabetes control more difficult. COGNITION AND MEMORY Cognition and Memory: Menopausal hormone therapy is not recommended for the prevention of cognitive disorders such as Alzheimer's disease or memory loss.  DEPRESSION  Depression may occur at any age, but it is common in elderly women. This may be because of physical, medical, social (loneliness), or financial problems and needs. If you are experiencing depression because of medical problems and control of symptoms, talk to your caregiver about this. Physical   activity and exercise may help with mood and sleep. Community and volunteer involvement may improve your sense of value and worth. If you have depression and you feel that the problem is getting worse or becoming severe, talk to your caregiver about which treatment options are best for you. ACCIDENTS  Accidents are common and can be serious in elderly woman. Prepare your house to prevent accidents. Eliminate throw rugs, place hand bars in bath, shower, and toilet areas. Avoid wearing high heeled shoes or walking on wet, snowy, and icy areas. Limit or stop driving if you have vision or hearing problems, or if you feel you are unsteady with your movements and  reflexes. HEPATITIS C Hepatitis C is a type of viral infection affecting the liver. It is spread mainly through contact with blood from an infected person. It can be treated, but if left untreated, it can lead to severe liver damage over the years. Many people who are infected do not know that the virus is in their blood. If you are a "baby-boomer", it is recommended that you have one screening test for Hepatitis C. IMMUNIZATIONS  Several immunizations are important to consider having during your senior years, including:   Tetanus, diphtheria, and pertussis booster shot.  Influenza every year before the flu season begins.  Pneumonia vaccine.  Shingles vaccine.  Others, as indicated based on your specific needs. Talk to your caregiver about these. Document Released: 11/03/2005 Document Revised: 01/26/2014 Document Reviewed: 06/29/2008 Exit[employer] Patient Information 2015 [employer]. This information is not intended to replace advice given to you by your health care provider. Make sure you discuss any questions you have with your health care provider.  

## 2014-09-11 NOTE — Telephone Encounter (Signed)
Estradiol 1 mg has been approved by [employer] [place].

## 2014-10-26 DIAGNOSIS — M2550 Pain in unspecified joint: Secondary | ICD-10-CM | POA: Diagnosis not present

## 2014-10-26 DIAGNOSIS — M0579 Rheumatoid arthritis with rheumatoid factor of multiple sites without organ or systems involvement: Secondary | ICD-10-CM | POA: Diagnosis not present

## 2014-10-26 DIAGNOSIS — Z79899 Other long term (current) drug therapy: Secondary | ICD-10-CM | POA: Diagnosis not present

## 2014-10-27 DIAGNOSIS — M0579 Rheumatoid arthritis with rheumatoid factor of multiple sites without organ or systems involvement: Secondary | ICD-10-CM | POA: Diagnosis not present

## 2014-11-23 DIAGNOSIS — M8580 Other specified disorders of bone density and structure, unspecified site: Secondary | ICD-10-CM | POA: Diagnosis not present

## 2014-11-23 DIAGNOSIS — Z13820 Encounter for screening for osteoporosis: Secondary | ICD-10-CM | POA: Diagnosis not present

## 2014-11-23 DIAGNOSIS — K219 Gastro-esophageal reflux disease without esophagitis: Secondary | ICD-10-CM | POA: Diagnosis not present

## 2014-11-23 DIAGNOSIS — M0540 Rheumatoid myopathy with rheumatoid arthritis of unspecified site: Secondary | ICD-10-CM | POA: Diagnosis not present

## 2014-11-23 DIAGNOSIS — Z136 Encounter for screening for cardiovascular disorders: Secondary | ICD-10-CM | POA: Diagnosis not present

## 2014-11-23 DIAGNOSIS — Z0000 Encounter for general adult medical examination without abnormal findings: Secondary | ICD-10-CM | POA: Diagnosis not present

## 2014-11-23 DIAGNOSIS — Z1211 Encounter for screening for malignant neoplasm of colon: Secondary | ICD-10-CM | POA: Diagnosis not present

## 2014-11-24 ENCOUNTER — Other Ambulatory Visit: Payer: Self-pay

## 2014-11-24 DIAGNOSIS — Z79890 Hormone replacement therapy (postmenopausal): Secondary | ICD-10-CM

## 2014-11-24 MED ORDER — ESTRADIOL 1 MG PO TABS: 1.0000 mg | ORAL_TABLET | Freq: Every day | ORAL | Status: DC

## 2014-12-01 DIAGNOSIS — M0579 Rheumatoid arthritis with rheumatoid factor of multiple sites without organ or systems involvement: Secondary | ICD-10-CM | POA: Diagnosis not present

## 2014-12-15 DIAGNOSIS — M0579 Rheumatoid arthritis with rheumatoid factor of multiple sites without organ or systems involvement: Secondary | ICD-10-CM | POA: Diagnosis not present

## 2014-12-16 DIAGNOSIS — M859 Disorder of bone density and structure, unspecified: Secondary | ICD-10-CM | POA: Diagnosis not present

## 2015-01-11 DIAGNOSIS — M0579 Rheumatoid arthritis with rheumatoid factor of multiple sites without organ or systems involvement: Secondary | ICD-10-CM | POA: Diagnosis not present

## 2015-01-13 ENCOUNTER — Observation Stay (HOSPITAL_COMMUNITY)
Admission: EM | Admit: 2015-01-13 | Discharge: 2015-01-14 | Disposition: A | Discharge status: Home / Self Care | Payer: Medicare Other | Attending: Internal Medicine | Admitting: Internal Medicine

## 2015-01-13 ENCOUNTER — Emergency Department (HOSPITAL_COMMUNITY): Payer: Medicare Other

## 2015-01-13 ENCOUNTER — Encounter (HOSPITAL_COMMUNITY): Payer: Self-pay | Admitting: Emergency Medicine

## 2015-01-13 DIAGNOSIS — R110 Nausea: Secondary | ICD-10-CM | POA: Diagnosis present

## 2015-01-13 DIAGNOSIS — R079 Chest pain, unspecified: Secondary | ICD-10-CM | POA: Diagnosis not present

## 2015-01-13 DIAGNOSIS — Z8249 Family history of ischemic heart disease and other diseases of the circulatory system: Secondary | ICD-10-CM | POA: Diagnosis not present

## 2015-01-13 DIAGNOSIS — R51 Headache: Secondary | ICD-10-CM | POA: Insufficient documentation

## 2015-01-13 DIAGNOSIS — R002 Palpitations: Secondary | ICD-10-CM | POA: Diagnosis not present

## 2015-01-13 DIAGNOSIS — M069 Rheumatoid arthritis, unspecified: Secondary | ICD-10-CM | POA: Diagnosis present

## 2015-01-13 DIAGNOSIS — I10 Essential (primary) hypertension: Secondary | ICD-10-CM | POA: Diagnosis not present

## 2015-01-13 DIAGNOSIS — G319 Degenerative disease of nervous system, unspecified: Secondary | ICD-10-CM | POA: Diagnosis not present

## 2015-01-13 DIAGNOSIS — R0989 Other specified symptoms and signs involving the circulatory and respiratory systems: Secondary | ICD-10-CM | POA: Diagnosis present

## 2015-01-13 DIAGNOSIS — I700 Atherosclerosis of aorta: Secondary | ICD-10-CM | POA: Diagnosis not present

## 2015-01-13 DIAGNOSIS — J42 Unspecified chronic bronchitis: Secondary | ICD-10-CM | POA: Insufficient documentation

## 2015-01-13 DIAGNOSIS — I517 Cardiomegaly: Secondary | ICD-10-CM | POA: Diagnosis not present

## 2015-01-13 DIAGNOSIS — K589 Irritable bowel syndrome without diarrhea: Secondary | ICD-10-CM | POA: Insufficient documentation

## 2015-01-13 DIAGNOSIS — Z90710 Acquired absence of both cervix and uterus: Secondary | ICD-10-CM | POA: Diagnosis not present

## 2015-01-13 DIAGNOSIS — Z885 Allergy status to narcotic agent status: Secondary | ICD-10-CM | POA: Diagnosis not present

## 2015-01-13 DIAGNOSIS — R519 Headache, unspecified: Secondary | ICD-10-CM

## 2015-01-13 DIAGNOSIS — R0789 Other chest pain: Principal | ICD-10-CM | POA: Insufficient documentation

## 2015-01-13 DIAGNOSIS — I6529 Occlusion and stenosis of unspecified carotid artery: Secondary | ICD-10-CM | POA: Diagnosis not present

## 2015-01-13 HISTORY — DX: Other specified postprocedural states: Z98.890

## 2015-01-13 HISTORY — DX: Pneumonia, unspecified organism: J18.9

## 2015-01-13 HISTORY — DX: Unspecified chronic bronchitis: J42

## 2015-01-13 HISTORY — DX: Nausea with vomiting, unspecified: R11.2

## 2015-01-13 HISTORY — DX: Basal cell carcinoma of skin, unspecified: C44.91

## 2015-01-13 LAB — BASIC METABOLIC PANEL
Anion gap: 7 (ref 5–15)
BUN: 14 mg/dL (ref 6–23)
CO2: 27 mmol/L (ref 19–32)
Calcium: 8.9 mg/dL (ref 8.4–10.5)
Chloride: 104 mmol/L (ref 96–112)
Creatinine, Ser: 0.76 mg/dL (ref 0.50–1.10)
GFR, EST NON AFRICAN AMERICAN: 87 mL/min — AB (ref >90)
GLUCOSE: 86 mg/dL (ref 70–99)
POTASSIUM: 3.7 mmol/L (ref 3.5–5.1)
SODIUM: 138 mmol/L (ref 135–145)

## 2015-01-13 LAB — TSH: TSH: 1.211 u[IU]/mL (ref 0.350–4.500)

## 2015-01-13 LAB — CBC WITH DIFFERENTIAL/PLATELET
BASOS ABS: 0.1 10*3/uL (ref 0.0–0.1)
Basophils Relative: 1 % (ref 0–1)
Eosinophils Absolute: 0.1 10*3/uL (ref 0.0–0.7)
Eosinophils Relative: 3 % (ref 0–5)
HEMATOCRIT: 43.5 % (ref 36.0–46.0)
HEMOGLOBIN: 15.1 g/dL — AB (ref 12.0–15.0)
LYMPHS PCT: 31 % (ref 12–46)
Lymphs Abs: 1.3 10*3/uL (ref 0.7–4.0)
MCH: 34.9 pg — ABNORMAL HIGH (ref 26.0–34.0)
MCHC: 34.7 g/dL (ref 30.0–36.0)
MCV: 100.5 fL — ABNORMAL HIGH (ref 78.0–100.0)
MONO ABS: 0.7 10*3/uL (ref 0.1–1.0)
MONOS PCT: 17 % — AB (ref 3–12)
NEUTROS ABS: 2 10*3/uL (ref 1.7–7.7)
Neutrophils Relative %: 48 % (ref 43–77)
Platelets: 233 10*3/uL (ref 150–400)
RBC: 4.33 MIL/uL (ref 3.87–5.11)
RDW: 13.4 % (ref 11.5–15.5)
WBC: 4.2 10*3/uL (ref 4.0–10.5)

## 2015-01-13 LAB — I-STAT TROPONIN, ED
TROPONIN I, POC: 0 ng/mL (ref 0.00–0.08)
TROPONIN I, POC: 0 ng/mL (ref 0.00–0.08)

## 2015-01-13 LAB — RAPID URINE DRUG SCREEN, HOSP PERFORMED
AMPHETAMINES: NOT DETECTED
Barbiturates: NOT DETECTED
Benzodiazepines: NOT DETECTED
COCAINE: NOT DETECTED
OPIATES: NOT DETECTED
Tetrahydrocannabinol: NOT DETECTED

## 2015-01-13 LAB — TROPONIN I: Troponin I: <0.03 ng/mL (ref <0.031)

## 2015-01-13 MED ORDER — FOLIC ACID 1 MG PO TABS
1.0000 mg | ORAL_TABLET | Freq: Every day | ORAL | Status: DC
  Administered 2015-01-14: 1 mg via ORAL
  Filled 2015-01-13 (×2): qty 1

## 2015-01-13 MED ORDER — SODIUM CHLORIDE 0.9 % IV SOLN
INTRAVENOUS | Status: DC
  Administered 2015-01-13: 18:00:00 via INTRAVENOUS

## 2015-01-13 MED ORDER — ONDANSETRON HCL 4 MG/2ML IJ SOLN
4.0000 mg | Freq: Once | INTRAMUSCULAR | Status: AC
  Administered 2015-01-13: 4 mg via INTRAVENOUS
  Filled 2015-01-13: qty 2

## 2015-01-13 MED ORDER — GI COCKTAIL ~~LOC~~
30.0000 mL | Freq: Four times a day (QID) | ORAL | Status: DC | PRN
  Filled 2015-01-13: qty 30

## 2015-01-13 MED ORDER — ONDANSETRON HCL 4 MG/2ML IJ SOLN: 4.0000 mg | Freq: Four times a day (QID) | INTRAMUSCULAR | Status: DC | PRN

## 2015-01-13 MED ORDER — ESTRADIOL 1 MG PO TABS
1.0000 mg | ORAL_TABLET | Freq: Every day | ORAL | Status: DC
  Administered 2015-01-13 – 2015-01-14 (×2): 1 mg via ORAL
  Filled 2015-01-13 (×2): qty 1

## 2015-01-13 MED ORDER — FAMOTIDINE 20 MG PO TABS
20.0000 mg | ORAL_TABLET | Freq: Every day | ORAL | Status: DC
  Administered 2015-01-13 – 2015-01-14 (×2): 20 mg via ORAL
  Filled 2015-01-13 (×2): qty 1

## 2015-01-13 MED ORDER — VITAMIN D3 25 MCG (1000 UNIT) PO TABS
1000.0000 [IU] | ORAL_TABLET | Freq: Every day | ORAL | Status: DC
  Administered 2015-01-14: 1000 [IU] via ORAL
  Filled 2015-01-13 (×2): qty 1

## 2015-01-13 MED ORDER — ZOLPIDEM TARTRATE 5 MG PO TABS: 5.0000 mg | ORAL_TABLET | Freq: Every evening | ORAL | Status: DC | PRN

## 2015-01-13 MED ORDER — FENTANYL CITRATE (PF) 100 MCG/2ML IJ SOLN
50.0000 ug | Freq: Once | INTRAMUSCULAR | Status: AC
  Administered 2015-01-13: 50 ug via INTRAVENOUS
  Filled 2015-01-13: qty 2

## 2015-01-13 MED ORDER — ENOXAPARIN SODIUM 40 MG/0.4ML ~~LOC~~ SOLN
40.0000 mg | SUBCUTANEOUS | Status: DC
  Administered 2015-01-13: 40 mg via SUBCUTANEOUS
  Filled 2015-01-13 (×2): qty 0.4

## 2015-01-13 MED ORDER — HYDROXYCHLOROQUINE SULFATE 200 MG PO TABS
200.0000 mg | ORAL_TABLET | Freq: Every day | ORAL | Status: DC
  Administered 2015-01-13 – 2015-01-14 (×2): 200 mg via ORAL
  Filled 2015-01-13 (×2): qty 1

## 2015-01-13 MED ORDER — ACETAMINOPHEN 325 MG PO TABS
650.0000 mg | ORAL_TABLET | ORAL | Status: DC | PRN
  Administered 2015-01-13: 650 mg via ORAL
  Filled 2015-01-13: qty 2

## 2015-01-13 MED ORDER — HYDRALAZINE HCL 20 MG/ML IJ SOLN: 10.0000 mg | Freq: Three times a day (TID) | INTRAMUSCULAR | Status: DC | PRN

## 2015-01-13 MED ORDER — SODIUM CHLORIDE 0.9 % IV BOLUS (SEPSIS)
500.0000 mL | Freq: Once | INTRAVENOUS | Status: AC
  Administered 2015-01-13: 500 mL via INTRAVENOUS

## 2015-01-13 MED ORDER — LORAZEPAM 2 MG/ML IJ SOLN
0.2500 mg | Freq: Once | INTRAMUSCULAR | Status: AC
  Administered 2015-01-13: 0.25 mg via INTRAVENOUS
  Filled 2015-01-13: qty 1

## 2015-01-13 MED ORDER — ALPRAZOLAM 0.25 MG PO TABS: 0.2500 mg | ORAL_TABLET | Freq: Two times a day (BID) | ORAL | Status: DC | PRN

## 2015-01-13 NOTE — H&P (Signed)
[facility] History and Physical  Patricia Long CZY:[id number] DOB: May 29, 1951 DOA: 01/13/2015  Referring physician: Delos Haring PA-C PCP: Dorian Heckle, MD   Chief Complaint: Chest pain   HPI: Patricia Long is a 64 y.o. female with PMH of Rheumatoid Arthritis, endometriosis, IBS, and palpitations ( she has worn a holter monitor in past).  She presents to the ER with chest pain and palpitations.  She began having chest pain and headache on Saturday(01/09/15). Her palpitations were much worse upon waking this morning which prompted her to come to the ED.  The pain is a constant,  non radiating,  pressure in the center of her chest.  She states that nothing makes the pain worse or better, and it is not exertional.  The pain is also not associated with any emotional distress or pain.  She denies a history of GERD.  Her troponin was negative, and her ekg did not show st elevation.  She does have a family history of heart disease.  Her Her dad died of a MI at 68 and her mom at 87. She reports starting Remicade infusions two months ago to replace Humira.  She is also on methotrexate and plaquenil.  She denies other changes in her medications.   She also mentions she was bitten behind her left ear on Saturday by an insect while working in the yard.  Fortunately she reports the bite mark  has decreased in size and redness.    In the ER, her blood pressures have fluctuated from the 190s to the 120s with no intervention. Labs are essentially normal.  Troponin is zero, EKG is unrevealing.  CXR is clear.  We will admit her for CP rule out and monitoring of her labile BP.  Cardiology has been consulted.    Review of Systems:  Constitutional:  No weight loss, night sweats, Fevers, chills, fatigue.  HEENT:  + headaches, No Difficulty swallowing,Tooth/dental problems,Sore throat,  No sneezing, itching, ear ache, nasal congestion, post nasal drip,  Cardio-vascular:  + chest pain, No  Orthopnea, PND, swelling in lower extremities, anasarca, dizziness, palpitations  GI:  +nausea, vomiting No heartburn, indigestion, abdominal pain, diarrhea, change in bowel habits, loss of appetite  Resp:  No shortness of breath with exertion or at rest. No excess mucus, no productive cough, No non-productive cough, No coughing up of blood.No change in color of mucus.No wheezing.No chest wall deformity  Skin:  Raised erythematous papule(60mm) behind left ear(likely spider bite).     GU:  no dysuria, change in color of urine, no urgency or frequency. No flank pain.  Musculoskeletal:  No joint pain or swelling. No decreased range of motion. No back pain.  Psych:  No change in mood or affect. No depression or anxiety. No memory loss.   Past Medical History  Diagnosis Date  . Endometriosis   . Fibroid   . Rheumatoid arthritis(714.0)   . Irritable bowel syndrome    Past Surgical History  Procedure Laterality Date  . Abdominal hysterectomy  1997    TAH/BSO  . Pelvic laparoscopy  '82 AND '87    X2...  . Breast surgery  1992    NODULE EXCISION OF RIGHT AND LEFT BREAST IN   . Foot surgery      X2  . Hand surgery  2009    LEFT WRIST AND FINGER   . Oophorectomy      BSO   Social History:  reports that she has never smoked. She has never  used smokeless tobacco. She reports that she drinks about 0.5 oz of alcohol per week. She reports that she does not use illicit drugs.  She lives with her husband.  Ambulatory and independent with ADLs.  Allergies  Allergen Reactions  . Prochlorperazine Edisylate Other (See Comments)    compazine  . Codeine Nausea Only and Rash  . Tape Rash    Family History  Problem Relation Age of Onset  . Heart disease Mother   . Heart disease Father   . Colon cancer Maternal Aunt   . Colon cancer Paternal Uncle   . Colon polyps Neg Hx   . Diabetes Neg Hx   . Kidney disease Neg Hx   . Esophageal cancer Neg Hx      Prior to Admission medications     Medication Sig Start Date End Date Taking? Authorizing Provider  calcium carbonate (OS-CAL) 600 MG TABS Take 600 mg by mouth 2 (two) times daily with a meal.   Yes Historical Provider, MD  cetirizine (ZYRTEC) 10 MG tablet Take 10 mg by mouth daily as needed (remicaide infusion).   Yes Historical Provider, MD  cholecalciferol (VITAMIN D) 1000 UNITS tablet Take 1,000 Units by mouth daily.   Yes Historical Provider, MD  estradiol (ESTRACE) 1 MG tablet Take 1 tablet (1 mg total) by mouth daily. 11/24/14  Yes Huel Cote, NP  folic acid (FOLVITE) 1 MG tablet Take 1 mg by mouth daily.   Yes Historical Provider, MD  hydroxychloroquine (PLAQUENIL) 200 MG tablet Take 200 mg by mouth daily.   Yes Historical Provider, MD  Methotrexate, Anti-Rheumatic, (METHOTREXATE, PF, [place]) Inject 20 mg into the skin once a week.   Yes Historical Provider, MD  ranitidine (ZANTAC) 150 MG tablet Take 150 mg by mouth daily as needed (remicaide infusion).   Yes Historical Provider, MD  dicyclomine (BENTYL) 10 MG capsule Take 1 tab twice daily as needed for cramping, spasms. Patient not taking: Reported on 01/13/2015 07/24/14   Willia Craze, NP   Physical Exam: Filed Vitals:   01/13/15 1400 01/13/15 1500 01/13/15 1615 01/13/15 1652  BP: 182/84 159/79 172/74 160/91  Pulse: 64 65 60 60  Temp:    97.5 F (36.4 C)  TempSrc:    Oral  Resp: 13 17 14 16   Height:    5\' 8"  (1.727 m)  Weight:    59.376 kg (130 lb 14.4 oz)  SpO2: 94% 99% 97% 100%    Wt Readings from Last 3 Encounters:  01/13/15 59.376 kg (130 lb 14.4 oz)  09/09/14 60.782 kg (134 lb)  07/24/14 60.952 kg (134 lb 6 oz)    General:  Well appearing female alert and oriented in good spirits.   Eyes: PERRL, normal lids, irises & conjunctiva ENT: grossly normal hearing, lips & tongue, dry mucous membranes  Neck: no LAD, masses or thyromegaly Cardiovascular: RRR, no m/r/g. No LE edema. Telemetry: SR, no arrhythmias  Respiratory: CTA bilaterally, no w/r/r.  Normal respiratory effort. Abdomen: soft, ntnd Skin: erythematous papule behind left ear(36mm), 3cm x 2cm raised erythematous rash between shoulder blades Musculoskeletal: grossly normal tone BUE/BLE Psychiatric: grossly normal mood and affect, speech fluent and appropriate Neurologic: grossly non-focal.          Labs on Admission:  Basic Metabolic Panel:  Recent Labs Lab 01/13/15 0932  NA 138  K 3.7  CL 104  CO2 27  GLUCOSE 86  BUN 14  CREATININE 0.76  CALCIUM 8.9   CBC:  Recent Labs  Lab 01/13/15 0932  WBC 4.2  NEUTROABS 2.0  HGB 15.1*  HCT 43.5  MCV 100.5*  PLT 233    Radiological Exams on Admission: Dg Chest 2 View  01/13/2015   CLINICAL DATA:  64 year old female with hypertension and chest pressure for 3 days. Initial encounter.  EXAM: CHEST  2 VIEW  COMPARISON:  12/06/2009  FINDINGS: Stable lung volumes. Mild cardiomegaly has not significantly changed. Other mediastinal contours are within normal limits. Visualized tracheal air column is within normal limits. No pneumothorax, pulmonary edema, pleural effusion or confluent pulmonary opacity. No acute osseous abnormality identified. Calcified atherosclerosis of the aorta.  IMPRESSION: No acute cardiopulmonary abnormality.   Electronically Signed   By: Genevie Ann M.D.   On: 01/13/2015 10:51   Ct Head Wo Contrast  01/13/2015   CLINICAL DATA:  Elevated blood pressure, palpitations, chest tightness, bad headache since Saturday, initial encounter  EXAM: CT HEAD WITHOUT CONTRAST  TECHNIQUE: Contiguous axial images were obtained from the base of the skull through the vertex without intravenous contrast.  COMPARISON:  None  FINDINGS: Minimal atrophy.  Normal ventricular morphology.  No midline shift or mass effect.  Otherwise normal appearance of brain parenchyma.  No intracranial hemorrhage, mass lesion or evidence acute infarction.  No extra-axial fluid collections.  Atherosclerotic calcifications at the carotid siphons.  Bones and  sinuses unremarkable.  IMPRESSION: No acute intracranial abnormalities.   Electronically Signed   By: Lavonia Dana M.D.   On: 01/13/2015 12:22    EKG: Independently reviewed. Sinus rhythm 62.  Assessment/Plan Principal Problem:   Chest pain- HEART score of 4.     Uncertain etiology-possibly ACS vs Remicade infustion.      Troponin neg. EKG normal. CXR negative.     Cardiology consulted. Admitted for observation on telemetry.    Cycle troponin, check TSH.   Active Problems:   Nausea without vomiting-   Patient has vomited today in the ED.  Likely due to administration of Fentanyl.    Supportive care of IV fluids and Zofran.  Will check LFTs given her RA medications.    Rheumatoid arthritis Follow up outpatient with rheumatologist.  Check LFTs.   Her current symptoms are listed as possible adverse side effects of Remicade.  May need to discuss cessation of Remicade.     Labile hypertension Hydralazine for systolic BP over 407.  Blood pressure has been fluctuating in ER with no intervention. Check plasma metanephrines and urine catecholamines   Cardiology consulted - I requested that they round on Ms. Bernales in the morning.  Code Status: Full DVT Prophylaxis:  Lovenox Family Communication: Patient's husband was in room, and plan was discussed with both.   Disposition Plan: Observation inpatient.  Likely home 01/14/15    Melton Alar Emory PA-S Imogene Burn, [place] [facility] Pager [phone]

## 2015-01-13 NOTE — ED Notes (Signed)
Report attempted 

## 2015-01-13 NOTE — ED Notes (Signed)
Pt states that her BP has been high  Since Monday she started having palpatations and chest tightness. These have been intermittent since Monday.  Pt states she had a bad HA on Saturday as well.

## 2015-01-13 NOTE — ED Notes (Signed)
Gave pt turkey sandwich and ginger ale. 

## 2015-01-13 NOTE — Progress Notes (Signed)
Received report from [facility] in ED @ [phone].

## 2015-01-13 NOTE — Progress Notes (Signed)
Pharmacy Consult to assess side effects of Methotrexate/Remicade  Pt is on Methotrexate and Remicade for Rheumatoid Arthritis. Pt takes methotrexate 20 mg [place] once/week, the dose of Remicade is unclear.  It is common for patients to experience more side effects when they are on both of these agents at the same time versus monotherapy, as they drugs work synergistically.  Hypertension is most often associated with infusion-reactions related to Remicade, it is less common for pts to develop hypertension throughout the remainder of Remicade therapy. Abdominal pain is a common side effect of Remicade, which could present as chest pain.  Re: LFTs on order for tomorrow - elevation of LFTs could be related to either Remicade or Methotrexate.  If cardiac causes are ruled out, the above symptoms could be due to Remicade.    Hughes Better, PharmD, BCPS Clinical Pharmacist Pager: [phone] 01/13/2015 6:49 PM

## 2015-01-13 NOTE — ED Provider Notes (Addendum)
CSN: [id number]     Arrival date & time 01/13/15  8101 History   First MD Initiated Contact with Patient 01/13/15 [phone]     Chief Complaint  Patient presents with  . Hypertension     (Consider location/radiation/quality/duration/timing/severity/associated sxs/prior Treatment) HPI    PCP: Dorian Heckle, MD Blood pressure 136/75, pulse 56, temperature 97.5 F (36.4 C), temperature source Oral, resp. rate 11, height 5\' 8"  (1.727 m), weight 135 lb (61.236 kg), SpO2 100 %.  Patricia Long is a 64 y.o.female with a significant PMH of Rheumatoid Arthritis, Endometriosis, IBS, fibriods  presents to the ER with complaints of chest pain, headache and hypertension. For the past week her blood pressure has been elevating up to the 258'N systolic and decreasing down to 120's throughout the day. She has had headache and chest pain with this as well. She also has had symptoms of palpitations. The chest pain started yesterday and has been constant pressure. She has never had chest pains before. She denies that anything makes the pain better or worse. She denies symptoms of exertional angina. Rest does not improve her pain. She has not had any SOB or breathing difficulty. Her headache is all over but mainly frontal headache with some pain in the back as well. She denies ever having headaches or chest pain before. She does not have a cardiologist.  She has had a total of 2 infusions within the 4 weeks.  Negative Review of Symptoms: fevers, neck pain, syncope, focal weakness, confusion, ataxia, aphagia, dysphagia, back pain.   Past Medical History  Diagnosis Date  . Endometriosis   . Fibroid   . Rheumatoid arthritis(714.0)   . Irritable bowel syndrome    Past Surgical History  Procedure Laterality Date  . Abdominal hysterectomy  1997    TAH/BSO  . Pelvic laparoscopy  '82 AND '87    X2...  . Breast surgery  1992    NODULE EXCISION OF RIGHT AND LEFT BREAST IN   . Foot surgery      X2  .  Hand surgery  2009    LEFT WRIST AND FINGER   . Oophorectomy      BSO   Family History  Problem Relation Age of Onset  . Heart disease Mother   . Heart disease Father   . Colon cancer Maternal Aunt   . Colon cancer Paternal Uncle   . Colon polyps Neg Hx   . Diabetes Neg Hx   . Kidney disease Neg Hx   . Esophageal cancer Neg Hx    History  Substance Use Topics  . Smoking status: Never Smoker   . Smokeless tobacco: Never Used  . Alcohol Use: 0.5 oz/week    1 drink(s) per week     Comment: OCCASIONAL   OB History    Gravida Para Term Preterm AB TAB SAB Ectopic Multiple Living   1 0   1  1   0     Review of Systems  10 Systems reviewed and are negative for acute change except as noted in the HPI.   Allergies  Prochlorperazine edisylate; Codeine; and Tape  Home Medications   Prior to Admission medications   Medication Sig Start Date End Date Taking? Authorizing Provider  calcium carbonate (OS-CAL) 600 MG TABS Take 600 mg by mouth 2 (two) times daily with a meal.   Yes Historical Provider, MD  cetirizine (ZYRTEC) 10 MG tablet Take 10 mg by mouth daily as needed (remicaide infusion).  Yes Historical Provider, MD  cholecalciferol (VITAMIN D) 1000 UNITS tablet Take 1,000 Units by mouth daily.   Yes Historical Provider, MD  estradiol (ESTRACE) 1 MG tablet Take 1 tablet (1 mg total) by mouth daily. 11/24/14  Yes Huel Cote, NP  folic acid (FOLVITE) 1 MG tablet Take 1 mg by mouth daily.   Yes Historical Provider, MD  hydroxychloroquine (PLAQUENIL) 200 MG tablet Take 200 mg by mouth daily.   Yes Historical Provider, MD  Methotrexate, Anti-Rheumatic, (METHOTREXATE, PF, [place]) Inject 20 mg into the skin once a week.   Yes Historical Provider, MD  ranitidine (ZANTAC) 150 MG tablet Take 150 mg by mouth daily as needed (remicaide infusion).   Yes Historical Provider, MD   BP 172/74 mmHg  Pulse 60  Temp(Src) 97.5 F (36.4 C) (Oral)  Resp 14  Ht 5\' 8"  (1.727 m)  Wt 135 lb (61.236  kg)  BMI 20.53 kg/m2  SpO2 97% Physical Exam  Constitutional: She appears well-developed and well-nourished. No distress.  HENT:  Head: Normocephalic and atraumatic.  Eyes: Pupils are equal, round, and reactive to light.  Neck: Normal range of motion. Neck supple.  Cardiovascular: Normal rate and regular rhythm.   Pulmonary/Chest: Effort normal.  Abdominal: Soft.  Neurological: She is alert.  Cranial nerves II-VIII and X-XII evaluated and show no deficits. Pt alert and oriented x 3 Upper and lower extremity strength is symmetrical and physiologic Normal muscular tone No facial droop Coordination intact, no limb ataxia  Skin: Skin is warm and dry.  Nursing note and vitals reviewed.   ED Course  Procedures (including critical care time) Labs Review Labs Reviewed  CBC WITH DIFFERENTIAL/PLATELET - Abnormal; Notable for the following:    Hemoglobin 15.1 (*)    MCV 100.5 (*)    MCH 34.9 (*)    Monocytes Relative 17 (*)    All other components within normal limits  BASIC METABOLIC PANEL - Abnormal; Notable for the following:    GFR calc non Af Amer 87 (*)    All other components within normal limits  METANEPHRINES, PLASMA  CATECHOLAMINES, FRACTIONATED, URINE, 24 HOUR  URINE RAPID DRUG SCREEN (HOSP PERFORMED)  I-STAT TROPOININ, ED  Randolm Idol, ED    Imaging Review Dg Chest 2 View  01/13/2015   CLINICAL DATA:  64 year old female with hypertension and chest pressure for 3 days. Initial encounter.  EXAM: CHEST  2 VIEW  COMPARISON:  12/06/2009  FINDINGS: Stable lung volumes. Mild cardiomegaly has not significantly changed. Other mediastinal contours are within normal limits. Visualized tracheal air column is within normal limits. No pneumothorax, pulmonary edema, pleural effusion or confluent pulmonary opacity. No acute osseous abnormality identified. Calcified atherosclerosis of the aorta.  IMPRESSION: No acute cardiopulmonary abnormality.   Electronically Signed   By: Genevie Ann  M.D.   On: 01/13/2015 10:51   Ct Head Wo Contrast  01/13/2015   CLINICAL DATA:  Elevated blood pressure, palpitations, chest tightness, bad headache since Saturday, initial encounter  EXAM: CT HEAD WITHOUT CONTRAST  TECHNIQUE: Contiguous axial images were obtained from the base of the skull through the vertex without intravenous contrast.  COMPARISON:  None  FINDINGS: Minimal atrophy.  Normal ventricular morphology.  No midline shift or mass effect.  Otherwise normal appearance of brain parenchyma.  No intracranial hemorrhage, mass lesion or evidence acute infarction.  No extra-axial fluid collections.  Atherosclerotic calcifications at the carotid siphons.  Bones and sinuses unremarkable.  IMPRESSION: No acute intracranial abnormalities.   Electronically Signed  By: Lavonia Dana M.D.   On: 01/13/2015 12:22     EKG Interpretation   Date/Time:  Wednesday January 13 2015 09:20:08 EDT Ventricular Rate:  62 PR Interval:  135 QRS Duration: 123 QT Interval:  446 QTC Calculation: 453 R Axis:   -5 Text Interpretation:  Sinus rhythm Nonspecific intraventricular conduction  delay Baseline wander in lead(s) V3 V4 V5 Confirmed by Orel Cooler  MD,  Quinley Nesler [phone]) on 01/13/2015 4:32:38 PM      MDM   Final diagnoses:  Hypertension  Headache  Chest pain, unspecified chest pain type    Medications  LORazepam (ATIVAN) injection 0.25 mg (0.25 mg Intravenous Given 01/13/15 1056)  fentaNYL (SUBLIMAZE) injection 50 mcg (50 mcg Intravenous Given 01/13/15 1329)  ondansetron (ZOFRAN) injection 4 mg (4 mg Intravenous Given 01/13/15 1327)  sodium chloride 0.9 % bolus 500 mL (0 mLs Intravenous Stopped 01/13/15 1559)  ondansetron (ZOFRAN) injection 4 mg (4 mg Intravenous Given 01/13/15 1439)   The patient continues to have chest pains, nausea, headache and her BP is liable here in the ED with large fluctuations.   Her CT scan of the head, chest xray, Troponin x 2, CBC and BMP show no acute findings.  The patient  has recently started Remicade and many of her symptoms are on the common side effect lists. This may be due to the medications. I feel that she would benefit from observation overnight to monitor her BP and to attempt to control blood pressure.  [place], PA-C has agreed to admit patient. Obs, Cardiac obs floor, [facility] admits   Delos Haring, PA-C 01/13/15 1538  Fredia Sorrow, MD 01/13/15 0102  Fredia Sorrow, MD 01/13/15 [phone]

## 2015-01-13 NOTE — ED Notes (Signed)
Pt will move to pod C to wait for room. Family informed

## 2015-01-13 NOTE — ED Provider Notes (Addendum)
ED ECG REPORT   Date: 01/13/2015  Rate: 62  Rhythm: normal sinus rhythm  QRS Axis: normal  Intervals: normal  ST/T Wave abnormalities: normal  Conduction Disutrbances:nonspecific intraventricular conduction delay  Narrative Interpretation:   Old EKG Reviewed: none available  I have personally reviewed the EKG tracing and agree with the computerized printout as noted.   Fredia Sorrow, MD 01/13/15 [phone]    Medical screening examination/treatment/procedure(s) were conducted as a shared visit with non-physician practitioner(s) and myself.  I personally evaluated the patient during the encounter.   EKG Interpretation   Date/Time:  Wednesday January 13 2015 09:20:08 EDT Ventricular Rate:  62 PR Interval:  135 QRS Duration: 123 QT Interval:  446 QTC Calculation: 453 R Axis:   -5 Text Interpretation:  Sinus rhythm Nonspecific intraventricular conduction  delay Baseline wander in lead(s) V3 V4 V5 Confirmed by Hena Ewalt  MD,  Kirklin Mcduffee [phone]) on 01/13/2015 4:32:38 PM     Results for orders placed or performed during the hospital encounter of 01/13/15  CBC with Differential/Platelet  Result Value Ref Range   WBC 4.2 4.0 - 10.5 K/uL   RBC 4.33 3.87 - 5.11 MIL/uL   Hemoglobin 15.1 (H) 12.0 - 15.0 g/dL   HCT 43.5 36.0 - 46.0 %   MCV 100.5 (H) 78.0 - 100.0 fL   MCH 34.9 (H) 26.0 - 34.0 pg   MCHC 34.7 30.0 - 36.0 g/dL   RDW 13.4 11.5 - 15.5 %   Platelets 233 150 - 400 K/uL   Neutrophils Relative % 48 43 - 77 %   Neutro Abs 2.0 1.7 - 7.7 K/uL   Lymphocytes Relative 31 12 - 46 %   Lymphs Abs 1.3 0.7 - 4.0 K/uL   Monocytes Relative 17 (H) 3 - 12 %   Monocytes Absolute 0.7 0.1 - 1.0 K/uL   Eosinophils Relative 3 0 - 5 %   Eosinophils Absolute 0.1 0.0 - 0.7 K/uL   Basophils Relative 1 0 - 1 %   Basophils Absolute 0.1 0.0 - 0.1 K/uL  Basic metabolic panel  Result Value Ref Range   Sodium 138 135 - 145 mmol/L   Potassium 3.7 3.5 - 5.1 mmol/L   Chloride 104 96 - 112 mmol/L   CO2 27  19 - 32 mmol/L   Glucose, Bld 86 70 - 99 mg/dL   BUN 14 6 - 23 mg/dL   Creatinine, Ser 0.76 0.50 - 1.10 mg/dL   Calcium 8.9 8.4 - 10.5 mg/dL   GFR calc non Af Amer 87 (L) >90 mL/min   GFR calc Af Amer >90 >90 mL/min   Anion gap 7 5 - 15  I-stat troponin, ED  Result Value Ref Range   Troponin i, poc 0.00 0.00 - 0.08 ng/mL   Comment 3          I-stat troponin, ED  Result Value Ref Range   Troponin i, poc 0.00 0.00 - 0.08 ng/mL   Comment 3           Dg Chest 2 View  01/13/2015   CLINICAL DATA:  64 year old female with hypertension and chest pressure for 3 days. Initial encounter.  EXAM: CHEST  2 VIEW  COMPARISON:  12/06/2009  FINDINGS: Stable lung volumes. Mild cardiomegaly has not significantly changed. Other mediastinal contours are within normal limits. Visualized tracheal air column is within normal limits. No pneumothorax, pulmonary edema, pleural effusion or confluent pulmonary opacity. No acute osseous abnormality identified. Calcified atherosclerosis of the aorta.  IMPRESSION: No  acute cardiopulmonary abnormality.   Electronically Signed   By: Genevie Ann M.D.   On: 01/13/2015 10:51   Ct Head Wo Contrast  01/13/2015   CLINICAL DATA:  Elevated blood pressure, palpitations, chest tightness, bad headache since Saturday, initial encounter  EXAM: CT HEAD WITHOUT CONTRAST  TECHNIQUE: Contiguous axial images were obtained from the base of the skull through the vertex without intravenous contrast.  COMPARISON:  None  FINDINGS: Minimal atrophy.  Normal ventricular morphology.  No midline shift or mass effect.  Otherwise normal appearance of brain parenchyma.  No intracranial hemorrhage, mass lesion or evidence acute infarction.  No extra-axial fluid collections.  Atherosclerotic calcifications at the carotid siphons.  Bones and sinuses unremarkable.  IMPRESSION: No acute intracranial abnormalities.   Electronically Signed   By: Lavonia Dana M.D.   On: 01/13/2015 12:22    Patient seen by me. Patient  originally presented for concerns for high blood pressure. That also it had some chest pain. Chest pain is ongoing. Patient noted that since Monday blood pressure was elevated and had the chest pain which is continuing patient will require admission and cardiac rule out. EKG without acute changes. Chest x-ray without acute changes. Elevated blood pressure corrected itself without any specific treatment here. Patient's lungs are clear bilaterally.  Fredia Sorrow, MD 01/13/15 [phone]

## 2015-01-14 DIAGNOSIS — I10 Essential (primary) hypertension: Secondary | ICD-10-CM | POA: Diagnosis not present

## 2015-01-14 DIAGNOSIS — R0789 Other chest pain: Secondary | ICD-10-CM | POA: Diagnosis not present

## 2015-01-14 DIAGNOSIS — M069 Rheumatoid arthritis, unspecified: Secondary | ICD-10-CM

## 2015-01-14 DIAGNOSIS — R110 Nausea: Secondary | ICD-10-CM | POA: Diagnosis not present

## 2015-01-14 DIAGNOSIS — R079 Chest pain, unspecified: Secondary | ICD-10-CM | POA: Diagnosis not present

## 2015-01-14 LAB — HEPATIC FUNCTION PANEL
ALBUMIN: 3.1 g/dL — AB (ref 3.5–5.2)
ALK PHOS: 42 U/L (ref 39–117)
ALT: 25 U/L (ref 0–35)
AST: 24 U/L (ref 0–37)
BILIRUBIN TOTAL: 0.4 mg/dL (ref 0.3–1.2)
Total Protein: 5.1 g/dL — ABNORMAL LOW (ref 6.0–8.3)

## 2015-01-14 LAB — GLUCOSE, CAPILLARY: GLUCOSE-CAPILLARY: 89 mg/dL (ref 70–99)

## 2015-01-14 LAB — TROPONIN I: Troponin I: <0.03 ng/mL (ref <0.031)

## 2015-01-14 NOTE — Care Management Note (Signed)
    Page 1 of 1   01/14/2015     2:08:46 PM CARE MANAGEMENT NOTE 01/14/2015  Patient:  Patricia Long, Patricia Long   Account Number:  [id number]  Date Initiated:  01/14/2015  Documentation initiated by:  Carles Collet  Subjective/Objective Assessment:   admitted with chest pain, independant from home lives with spouse     Action/Plan:   will follow for discharge needs   Anticipated DC Date:  01/14/2015   Anticipated DC Plan:  [place]  CM consult      Choice offered to / List presented to:             Status of service:  Completed, signed off Medicare Important Message given?   (If response is "NO", the following Medicare IM given date fields will be blank) Date Medicare IM given:   Medicare IM given by:   Date Additional Medicare IM given:   Additional Medicare IM given by:    Discharge Disposition:  HOME/SELF CARE  Per UR Regulation:  Reviewed for med. necessity/level of care/duration of stay  If discussed at [place] of Stay Meetings, dates discussed:    Comments:  01-14-15 no dc needs identified. Carles Collet RN BSN CM

## 2015-01-14 NOTE — Progress Notes (Signed)
UR completed 

## 2015-01-14 NOTE — Consult Note (Signed)
Reason for Consult: Chest pain  Requesting Physician: Rai  Cardiologist: None  HPI: This is a 64 y.o. female with a past medical history significant for rheumatoid arthritis on chronic methotrexate and Remicade admitted with 2-day complaints of palpitations and chest discomfort that are not associated with physical activity. Her electrocardiogram does not show any acute abnormalities and 3 sets of cardiac enzymes are normal. She has had variable elevated blood pressure since admission, but does not bear a diagnosis of hypertension. The last couple of blood pressure recordings have been virtually normal. She does not have diabetes mellitus or known hyperlipidemia and she does not smoke. Heart disease is prevalent in her family but not at young ages. She is very active, lean and fit. She exercises regularly and never has chest discomfort or other complaints during exercise.  Review of the cardiac slices on a previous CT of the abdomen shows mild atheromatous plaque in the thoracic aorta but no visible calcium in the coronary distribution. The aortic atherosclerosis calcification is also visible on her chest x-ray. The chest x-ray describes "mild cardiomegaly" (not sure I agree with that assessment), but no other signs of congestive heart failure.   PMHx:  Past Medical History  Diagnosis Date  . Endometriosis   . Fibroid   . Irritable bowel syndrome   . Basal cell carcinoma ~ 2013    "legs"  . PONV (postoperative nausea and vomiting)   . Pneumonia 1970's X 1; ~ 2013  . Chronic bronchitis     "I've had it several times but not q yr" (01/13/2015)  . Rheumatoid arthritis(714.0)    Past Surgical History  Procedure Laterality Date  . Total abdominal hysterectomy  1997    TAH/BSO  . Pelvic laparoscopy  '82 AND '87  . Foot surgery Bilateral ~ 2006-2008    "reconstruction"  . Joint replacement Left 2009    WRIST AND FINGER   . Knee arthroscopy Right 1980's  . Breast cyst excision  Bilateral 1992    NODULE EXCISION OF RIGHT AND LEFT BREAST  . Basal cell carcinoma excision  ~ 2013    "legs"    FAMHx: Family History  Problem Relation Age of Onset  . Heart disease Mother   . Heart disease Father   . Colon cancer Maternal Aunt   . Colon cancer Paternal Uncle   . Colon polyps Neg Hx   . Diabetes Neg Hx   . Kidney disease Neg Hx   . Esophageal cancer Neg Hx     SOCHx:  reports that she has never smoked. She has never used smokeless tobacco. She reports that she drinks about 1.2 oz of alcohol per week. She reports that she does not use illicit drugs.  ALLERGIES: Allergies  Allergen Reactions  . Prochlorperazine Edisylate Other (See Comments)    compazine  . Codeine Nausea Only and Rash  . Tape Rash    ROS: Constitutional:  No weight loss, night sweats, Fevers, chills, fatigue.  HEENT:  + headaches, No Difficulty swallowing,Tooth/dental problems,Sore throat,  No sneezing, itching, ear ache, nasal congestion, post nasal drip,  Cardio-vascular:  + chest pain, No Orthopnea, PND, swelling in lower extremities, anasarca, dizziness, palpitations  GI:  +nausea, vomiting No heartburn, indigestion, abdominal pain, diarrhea, change in bowel habits, loss of appetite  Resp:  No shortness of breath with exertion or at rest. No excess mucus, no productive cough, No non-productive cough, No coughing up of blood.No change in color of mucus.No wheezing.No  chest wall deformity  Skin:  Raised erythematous papule(72mm) behind left ear  GU:  no dysuria, change in color of urine, no urgency or frequency. No flank pain.  Musculoskeletal:  No joint pain or swelling. No decreased range of motion. No back pain.  Psych:  No change in mood or affect. No depression or anxiety. No memory loss.    HOME MEDICATIONS: Prescriptions prior to admission  Medication Sig Dispense Refill Last Dose  . calcium carbonate (OS-CAL) 600 MG TABS Take 600 mg by mouth 2 (two)  times daily with a meal.   01/12/2015 at Unknown time  . cetirizine (ZYRTEC) 10 MG tablet Take 10 mg by mouth daily as needed (remicaide infusion).   Past Week at Unknown time  . cholecalciferol (VITAMIN D) 1000 UNITS tablet Take 1,000 Units by mouth daily.   01/12/2015 at Unknown time  . estradiol (ESTRACE) 1 MG tablet Take 1 tablet (1 mg total) by mouth daily. 90 tablet 3 01/12/2015 at Unknown time  . folic acid (FOLVITE) 1 MG tablet Take 1 mg by mouth daily.   01/12/2015 at Unknown time  . hydroxychloroquine (PLAQUENIL) 200 MG tablet Take 200 mg by mouth daily.   01/12/2015 at Unknown time  . Methotrexate, Anti-Rheumatic, (METHOTREXATE, PF, [place]) Inject 20 mg into the skin once a week.   01/03/2015 at [place]  . ranitidine (ZANTAC) 150 MG tablet Take 150 mg by mouth daily as needed (remicaide infusion).   Past Week at Unknown time    HOSPITAL MEDICATIONS: Scheduled: . cholecalciferol  1,000 Units Oral Daily  . enoxaparin (LOVENOX) injection  40 mg Subcutaneous Q24H  . estradiol  1 mg Oral Daily  . famotidine  20 mg Oral Daily  . folic acid  1 mg Oral Daily  . hydroxychloroquine  200 mg Oral Daily    VITALS: Blood pressure 144/66, pulse 64, temperature 98.2 F (36.8 C), temperature source Oral, resp. rate 18, height 5\' 8"  (1.727 m), weight 130 lb 14.4 oz (59.376 kg), SpO2 96 %.  PHYSICAL EXAM:  General: Alert, oriented x3, no distress Head: no evidence of trauma, PERRL, EOMI, no exophtalmos or lid lag, no myxedema, no xanthelasma; normal ears, nose and oropharynx Neck: Normal jugular venous pulsations and no hepatojugular reflux; brisk carotid pulses without delay and no carotid bruits Chest: clear to auscultation, no signs of consolidation by percussion or palpation, normal fremitus, symmetrical and full respiratory excursions Cardiovascular: normal position and quality of the apical impulse, regular rhythm, normal first heart sound and normal second heart sound, no rubs or gallops, no  murmur Abdomen: no tenderness or distention, no masses by palpation, no abnormal pulsatility or arterial bruits, normal bowel sounds, no hepatosplenomegaly Extremities: no clubbing, cyanosis;  no edema; 2+ radial, ulnar and brachial pulses bilaterally; 2+ right femoral, posterior tibial and dorsalis pedis pulses; 2+ left femoral, posterior tibial and dorsalis pedis pulses; no subclavian or femoral bruits. She has moderate joint deformity and ulnar deviation consistent with rheumatoid arthritis in both hands. Neurological: grossly nonfocal   LABS  CBC  Recent Labs  01/13/15 0932  WBC 4.2  NEUTROABS 2.0  HGB 15.1*  HCT 43.5  MCV 100.5*  PLT 756   Basic Metabolic Panel  Recent Labs  01/13/15 0932  NA 138  K 3.7  CL 104  CO2 27  GLUCOSE 86  BUN 14  CREATININE 0.76  CALCIUM 8.9   Liver Function Tests  Recent Labs  01/14/15 0438  AST 24  ALT 25  ALKPHOS 42  BILITOT 0.4  PROT 5.1*  ALBUMIN 3.1*   No results for input(s): LIPASE, AMYLASE in the last 72 hours. Cardiac Enzymes  Recent Labs  01/13/15 1854 01/13/15 2238 01/14/15 0438  TROPONINI <0.03 <0.03 <0.03   BNP Invalid input(s): POCBNP D-Dimer No results for input(s): DDIMER in the last 72 hours. Hemoglobin A1C No results for input(s): HGBA1C in the last 72 hours. Fasting Lipid Panel No results for input(s): CHOL, HDL, LDLCALC, TRIG, CHOLHDL, LDLDIRECT in the last 72 hours. Thyroid Function Tests  Recent Labs  01/13/15 1854  TSH 1.211      IMAGING: Dg Chest 2 View  01/13/2015   CLINICAL DATA:  64 year old female with hypertension and chest pressure for 3 days. Initial encounter.  EXAM: CHEST  2 VIEW  COMPARISON:  12/06/2009  FINDINGS: Stable lung volumes. Mild cardiomegaly has not significantly changed. Other mediastinal contours are within normal limits. Visualized tracheal air column is within normal limits. No pneumothorax, pulmonary edema, pleural effusion or confluent pulmonary opacity. No  acute osseous abnormality identified. Calcified atherosclerosis of the aorta.  IMPRESSION: No acute cardiopulmonary abnormality.   Electronically Signed   By: Genevie Ann M.D.   On: 01/13/2015 10:51   Ct Head Wo Contrast  01/13/2015   CLINICAL DATA:  Elevated blood pressure, palpitations, chest tightness, bad headache since Saturday, initial encounter  EXAM: CT HEAD WITHOUT CONTRAST  TECHNIQUE: Contiguous axial images were obtained from the base of the skull through the vertex without intravenous contrast.  COMPARISON:  None  FINDINGS: Minimal atrophy.  Normal ventricular morphology.  No midline shift or mass effect.  Otherwise normal appearance of brain parenchyma.  No intracranial hemorrhage, mass lesion or evidence acute infarction.  No extra-axial fluid collections.  Atherosclerotic calcifications at the carotid siphons.  Bones and sinuses unremarkable.  IMPRESSION: No acute intracranial abnormalities.   Electronically Signed   By: Lavonia Dana M.D.   On: 01/13/2015 12:22    ECG: Sinus rhythm, incomplete right bundle branch block, no repolarization abnormalities  TELEMETRY/RECOMMENDATION:  mild sinus bradycardia/normal sinus rhythm  IMPRESSION: 1. Atypical chest discomfort without any high risk features. EKG, cardiac enzymes and resolution of symptoms are all reassuring. I would proceed with plans for echocardiography as well as a treadmill stress test, but I think these can be performed safely as an outpatient with cardiology follow-up. She had a lipid profile with her primary care physician not long ago and was told that her cholesterol is "a little high". There is evidence of atherosclerosis in her aorta I would probably recommend a target LDL less than 100, even if her stress test is normal.  2. Transient systemic hypertension - the etiology of this is uncertain. It could be related to Remicade infusion although she has had 2 previous infusions without that complaint. She did great with Humira for  10 years but unfortunately is unable to afford the co-pay for this medication. I think it would be beneficial to the patient if we did find a way for her insurance to cover Humira.    Time Spent Directly with Patient: 60 minutes  Sanda Klein, MD, [facility] [phone] office [phone] pager   01/14/2015, 11:13 AM

## 2015-01-14 NOTE — Discharge Summary (Signed)
Physician Discharge Summary   Patient ID: Patricia Long MRN: [id number] DOB/AGE: 1951-02-13 63 y.o.  Admit date: 01/13/2015 Discharge date: 01/14/2015  Primary Care Physician:  Dorian Heckle, MD  Discharge Diagnoses:    . Hypertension . atypical Chest pain . Rheumatoid arthritis . Nausea without vomiting . Labile hypertension  Consults: Cardiology, Dr. Sallyanne Kuster   Recommendations for Outpatient Follow-up:   Patient was seen by Dr. Dani Gobble Croitoru inpatient, did not recommend any antihypertensives for now. [facility] heart care office will arrange for outpatient 2-D echo, stress test and follow-up appointment with Dr Sallyanne Kuster.   TESTS THAT NEED FOLLOW-UP Outpatient stress test, 2-D echo  Patient was recommended to keep a log of BP readings daily and bring it to the follow-up appointment   DIET: Low-sodium diet    Allergies:   Allergies  Allergen Reactions  . Prochlorperazine Edisylate Other (See Comments)    compazine  . Codeine Nausea Only and Rash  . Tape Rash     Discharge Medications:   Medication List    TAKE these medications        calcium carbonate 600 MG Tabs tablet  Commonly known as:  OS-CAL  Take 600 mg by mouth 2 (two) times daily with a meal.     cetirizine 10 MG tablet  Commonly known as:  ZYRTEC  Take 10 mg by mouth daily as needed (remicaide infusion).     cholecalciferol 1000 UNITS tablet  Commonly known as:  VITAMIN D  Take 1,000 Units by mouth daily.     estradiol 1 MG tablet  Commonly known as:  ESTRACE  Take 1 tablet (1 mg total) by mouth daily.     folic acid 1 MG tablet  Commonly known as:  FOLVITE  Take 1 mg by mouth daily.     METHOTREXATE (PF) [place]  Inject 20 mg into the skin once a week.     PLAQUENIL 200 MG tablet  Generic drug:  hydroxychloroquine  Take 200 mg by mouth daily.     ranitidine 150 MG tablet  Commonly known as:  ZANTAC  Take 150 mg by mouth daily as needed (remicaide infusion).         Brief H  and P: For complete details please refer to admission H and P, but in brief patient is a 64 year old female with rheumatoid arthritis, IBS, palpitations, she has previously worn no Holter monitor in the past. Patient presented to the ED with chest pain and palpitations. Patient reported that she began having chest pain and headache on Saturday 4/16. Her palpitations were much worse upon waking up on the morning of admission which prompted her to come to the ED. Patient described the chest pain as constant, non-radiating, pressure-like, nonexertional. Pain was not associated with any food, nothing made the pain worse or better. Troponins were negative, EKG did not show acute ST-T wave changes associated with ischemia. Patient also reported starting Remicade infusion 2 months ago to replace humira.  In the ER, her blood pressures have fluctuated from the 190s to the 120s with no intervention. Labs are essentially normal. Troponin is zero, EKG is unrevealing. CXR is clear. We will admit her for CP rule out and monitoring of her labile BP.   Hospital Course:  Atypical chest pain with palpitations, labile hypertension. - EKG negative for any ischemia, cardiac enzymes remained negative, patient had no further chest pain episodes. Cardiology was consulted and recommended outpatient 2-D echo, treadmill stress test and cardiology follow-up. Patient had a  lipid profile with her primary care physician and was told that her cholesterol was a little high. Chest x-ray showed mild cardiomegaly. CT head was negative for acute intracranial abnormalities. Per cardiology, there is evidence of atherosclerotic process in aorta, recommend target LDL less than 100 even if the stress test is normal.  Transient elevated BP readings: Unclear etiology, could be related to Remicade infusion. Patient had done very well with Humira for 10 years but unfortunately was unable to afford the co-pay. Cardiology recommended not starting  any antihypertensives at this time, patient was recommended to keep a log of her BP readings and bring into follow-up appointment at which time will be decided if patient needed to be on antihypertensive.     Nausea without vomiting- resolved, patient tolerating diet without any difficulty    Rheumatoid arthritis -Continue methotrexate, currently on Remicade, patient had done very well with her marrow for 10 years and unfortunately unable to afford the co-pay. It is possible that her symptoms of chest pressure, palpitations and labile BP could be related to Remicade infusion. Patient was recommended to follow up with Dr. Gavin Pound, heart rheumatologist. Her Remicade infusion is due in  2 months.    Day of Discharge BP 144/66 mmHg  Pulse 64  Temp(Src) 98.2 F (36.8 C) (Oral)  Resp 18  Ht 5\' 8"  (1.727 m)  Wt 59.376 kg (130 lb 14.4 oz)  BMI 19.91 kg/m2  SpO2 96%  Physical Exam: General: Alert and awake oriented x3 not in any acute distress. HEENT: anicteric sclera, pupils reactive to light and accommodation CVS: S1-S2 clear no murmur rubs or gallops Chest: clear to auscultation bilaterally, no wheezing rales or rhonchi Abdomen: soft nontender, nondistended, normal bowel sounds Extremities: no cyanosis, clubbing or edema noted bilaterally Neuro: Cranial nerves II-XII intact, no focal neurological deficits   The results of significant diagnostics from this hospitalization (including imaging, microbiology, ancillary and laboratory) are listed below for reference.    LAB RESULTS: Basic Metabolic Panel:  Recent Labs Lab 01/13/15 0932  NA 138  K 3.7  CL 104  CO2 27  GLUCOSE 86  BUN 14  CREATININE 0.76  CALCIUM 8.9   Liver Function Tests:  Recent Labs Lab 01/14/15 0438  AST 24  ALT 25  ALKPHOS 42  BILITOT 0.4  PROT 5.1*  ALBUMIN 3.1*   No results for input(s): LIPASE, AMYLASE in the last 168 hours. No results for input(s): AMMONIA in the last 168  hours. CBC:  Recent Labs Lab 01/13/15 0932  WBC 4.2  NEUTROABS 2.0  HGB 15.1*  HCT 43.5  MCV 100.5*  PLT 233   Cardiac Enzymes:  Recent Labs Lab 01/13/15 2238 01/14/15 0438  TROPONINI <0.03 <0.03   BNP: Invalid input(s): POCBNP CBG:  Recent Labs Lab 01/14/15 0747  GLUCAP 89    Significant Diagnostic Studies:  Dg Chest 2 View  01/13/2015   CLINICAL DATA:  64 year old female with hypertension and chest pressure for 3 days. Initial encounter.  EXAM: CHEST  2 VIEW  COMPARISON:  12/06/2009  FINDINGS: Stable lung volumes. Mild cardiomegaly has not significantly changed. Other mediastinal contours are within normal limits. Visualized tracheal air column is within normal limits. No pneumothorax, pulmonary edema, pleural effusion or confluent pulmonary opacity. No acute osseous abnormality identified. Calcified atherosclerosis of the aorta.  IMPRESSION: No acute cardiopulmonary abnormality.   Electronically Signed   By: Genevie Ann M.D.   On: 01/13/2015 10:51   Ct Head Wo Contrast  01/13/2015  CLINICAL DATA:  Elevated blood pressure, palpitations, chest tightness, bad headache since Saturday, initial encounter  EXAM: CT HEAD WITHOUT CONTRAST  TECHNIQUE: Contiguous axial images were obtained from the base of the skull through the vertex without intravenous contrast.  COMPARISON:  None  FINDINGS: Minimal atrophy.  Normal ventricular morphology.  No midline shift or mass effect.  Otherwise normal appearance of brain parenchyma.  No intracranial hemorrhage, mass lesion or evidence acute infarction.  No extra-axial fluid collections.  Atherosclerotic calcifications at the carotid siphons.  Bones and sinuses unremarkable.  IMPRESSION: No acute intracranial abnormalities.   Electronically Signed   By: Lavonia Dana M.D.   On: 01/13/2015 12:22     Disposition and Follow-up:     Discharge Instructions    Diet - low sodium heart healthy    Complete by:  As directed      Discharge instructions     Complete by:  As directed   Dr Sallyanne Kuster will arrange outpatient echocardiogram and stress test. Please keep a log of BP readings daily and bring the log to the follow-up appointment.     Increase activity slowly    Complete by:  As directed             DISPOSITION: HOME    DISCHARGE FOLLOW-UP Follow-up Information    Follow up with Dorian Heckle, MD. Schedule an appointment as soon as possible for a visit in 2 weeks.   Specialty:  Internal Medicine   Why:  for hospital follow-up   Contact information:   [address] [place] [address] [phone]       Follow up with Sanda Klein, MD In 2 weeks.   Specialty:  Cardiology   Why:  Office will call you to set up appointment for stress test, 2-D echocardiogram and hospital follow-up appointment   Contact information:   [address] [place] [address] [phone]        Time spent on Discharge: 35 mins   Signed:   Margan Elias M.D. [facility] 01/14/2015, 12:44 PM Pager: [phone]

## 2015-01-15 ENCOUNTER — Other Ambulatory Visit: Payer: Self-pay | Admitting: Cardiovascular Disease

## 2015-01-15 DIAGNOSIS — R072 Precordial pain: Secondary | ICD-10-CM

## 2015-01-16 LAB — METANEPHRINES, PLASMA
METANEPHRINE FREE: 46 pg/mL (ref 0–62)
Normetanephrine, Free: 137 pg/mL (ref 0–145)

## 2015-01-18 ENCOUNTER — Encounter (HOSPITAL_COMMUNITY): Payer: Self-pay | Admitting: *Deleted

## 2015-01-19 ENCOUNTER — Ambulatory Visit (HOSPITAL_COMMUNITY)
Admission: RE | Admit: 2015-01-19 | Discharge: 2015-01-19 | Disposition: A | Discharge status: Home / Self Care | Payer: Medicare Other | Source: Ambulatory Visit | Attending: Cardiology | Admitting: Cardiology

## 2015-01-19 DIAGNOSIS — R072 Precordial pain: Secondary | ICD-10-CM | POA: Diagnosis not present

## 2015-01-19 NOTE — Procedures (Signed)
Exercise Treadmill Test   Test  Exercise Tolerance Test Ordering MD: Sanda Klein, MD    Unique Test No: 1  Treadmill:  1  Indication for ETT: chest pain - rule out ischemia  Contraindication to ETT: No   Stress Modality: exercise - treadmill  Cardiac Imaging Performed: non   Protocol: standard Bruce - maximal  Max BP:  203/78  Max MPHR (bpm):  155 85% MPR (bpm):  132  MPHR obtained (bpm):  144 % MPHR obtained:  92  Reached 85% MPHR (min:sec):  7:20 Total Exercise Time (min-sec):  9  Workload in METS:  10.1 Borg Scale: 14  Reason ETT Terminated:  fatigue    ST Segment Analysis At Rest: normal ST segments - no evidence of significant ST depression With Exercise: no evidence of significant ST depression  Other Information Arrhythmia:  No Angina during ETT:  absent (0) Quality of ETT:  diagnostic  ETT Interpretation:  normal - no evidence of ischemia by ST analysis  Comments: Good exercise tolerance No exercise induced ischemic changes Hypertensive response to exercise  Pixie Casino, MD, [facility] Attending Cardiologist [place]

## 2015-01-22 ENCOUNTER — Ambulatory Visit (HOSPITAL_COMMUNITY)
Admission: RE | Admit: 2015-01-22 | Discharge: 2015-01-22 | Disposition: A | Discharge status: Home / Self Care | Payer: Medicare Other | Source: Ambulatory Visit | Attending: Internal Medicine | Admitting: Internal Medicine

## 2015-01-22 DIAGNOSIS — Z8249 Family history of ischemic heart disease and other diseases of the circulatory system: Secondary | ICD-10-CM | POA: Insufficient documentation

## 2015-01-22 DIAGNOSIS — R072 Precordial pain: Secondary | ICD-10-CM

## 2015-01-22 DIAGNOSIS — R079 Chest pain, unspecified: Secondary | ICD-10-CM

## 2015-01-22 NOTE — Progress Notes (Signed)
2D Echocardiogram Complete.  01/22/2015   Kanoelani Dobies, RDCS  

## 2015-01-27 DIAGNOSIS — I10 Essential (primary) hypertension: Secondary | ICD-10-CM | POA: Diagnosis not present

## 2015-01-27 DIAGNOSIS — Z79899 Other long term (current) drug therapy: Secondary | ICD-10-CM | POA: Diagnosis not present

## 2015-01-27 DIAGNOSIS — M2550 Pain in unspecified joint: Secondary | ICD-10-CM | POA: Diagnosis not present

## 2015-01-27 DIAGNOSIS — M0579 Rheumatoid arthritis with rheumatoid factor of multiple sites without organ or systems involvement: Secondary | ICD-10-CM | POA: Diagnosis not present

## 2015-01-28 LAB — CATECHOLAMINES, FRACTIONATED, URINE, 24 HOUR

## 2015-02-04 DIAGNOSIS — Z79899 Other long term (current) drug therapy: Secondary | ICD-10-CM | POA: Diagnosis not present

## 2015-02-04 DIAGNOSIS — M069 Rheumatoid arthritis, unspecified: Secondary | ICD-10-CM | POA: Diagnosis not present

## 2015-03-08 ENCOUNTER — Encounter: Payer: Self-pay | Admitting: Cardiovascular Disease

## 2015-03-08 ENCOUNTER — Ambulatory Visit (INDEPENDENT_AMBULATORY_CARE_PROVIDER_SITE_OTHER): Payer: Medicare Other | Admitting: Cardiovascular Disease

## 2015-03-08 VITALS — BP 142/78 | HR 58 | Ht 68.0 in | Wt 130.6 lb

## 2015-03-08 DIAGNOSIS — I10 Essential (primary) hypertension: Secondary | ICD-10-CM

## 2015-03-08 DIAGNOSIS — I700 Atherosclerosis of aorta: Secondary | ICD-10-CM | POA: Diagnosis not present

## 2015-03-08 DIAGNOSIS — R079 Chest pain, unspecified: Secondary | ICD-10-CM | POA: Diagnosis not present

## 2015-03-08 NOTE — Patient Instructions (Signed)
Dr. Croitoru recommends that you schedule a follow-up appointment in: AS NEEDED   

## 2015-03-08 NOTE — Progress Notes (Signed)
Patient ID: Patricia Long, female   DOB: 04/25/1951, 64 y.o.   MRN: [id number]      Cardiology Office Note   Date:  03/08/2015   ID:  Samanthan, Dugo [birth year], MRN [id number]  PCP:  Dorian Heckle, MD  Cardiologist:   Sanda Klein, MD   Chief Complaint  Patient presents with  . Follow-up    follow up on echo and stress test,no chest discomfort , no leg pain or swelling. no concerns      History of Present Illness: Patricia Long is a 64 y.o. female who presents for follow-up after emergency room evaluation for chest pain. Her workup has included a normal stress test and a normal echocardiogram. Cardiac enzymes and ECG were nondiagnostic. The events happened not long after an infusion of Remicade and were associated with elevated blood pressure. She has kept a detailed record of her home blood pressure and it is more often than not high with the typical systolic blood pressure in the mid 140s to mid 150s but with normal diastolic blood pressure. She is physically very active, lean and eats an very healthy diet, although she does not pay particular attention to the level of sodium in her food. She does use a saltshaker. She is very reluctant to admit that her blood pressure is abnormally high. She feels great today and has no complaints.  Past Medical History  Diagnosis Date  . Endometriosis   . Fibroid   . Irritable bowel syndrome   . Basal cell carcinoma ~ 2013    "legs"  . PONV (postoperative nausea and vomiting)   . Pneumonia 1970's X 1; ~ 2013  . Chronic bronchitis     "I've had it several times but not q yr" (01/13/2015)  . Rheumatoid arthritis(714.0)     Past Surgical History  Procedure Laterality Date  . Total abdominal hysterectomy  1997    TAH/BSO  . Pelvic laparoscopy  '82 AND '87  . Foot surgery Bilateral ~ 2006-2008    "reconstruction"  . Joint replacement Left 2009    WRIST AND FINGER   . Knee arthroscopy Right 1980's  . Breast cyst  excision Bilateral 1992    NODULE EXCISION OF RIGHT AND LEFT BREAST  . Basal cell carcinoma excision  ~ 2013    "legs"     Current Outpatient Prescriptions  Medication Sig Dispense Refill  . calcium carbonate (OS-CAL) 600 MG TABS Take 600 mg by mouth 2 (two) times daily with a meal.    . cetirizine (ZYRTEC) 10 MG tablet Take 10 mg by mouth daily as needed (remicaide infusion).    . cholecalciferol (VITAMIN D) 1000 UNITS tablet Take 1,000 Units by mouth daily.    Marland Kitchen estradiol (ESTRACE) 1 MG tablet Take 1 tablet (1 mg total) by mouth daily. 90 tablet 3  . folic acid (FOLVITE) 1 MG tablet Take 1 mg by mouth daily.    . hydroxychloroquine (PLAQUENIL) 200 MG tablet Take 200 mg by mouth daily.    . methotrexate 50 MG/2ML injection Inject 1 mg into the muscle once a week.    . Methotrexate, Anti-Rheumatic, (METHOTREXATE, PF, [place]) Inject 20 mg into the skin once a week.    . pantoprazole (PROTONIX) 40 MG tablet Take 1 tablet by mouth daily.    . ranitidine (ZANTAC) 150 MG tablet Take 150 mg by mouth daily as needed (remicaide infusion).    . valACYclovir (VALTREX) 1000 MG tablet Take 1 tablet by mouth as needed.    Marland Kitchen  calcium carbonate (CALCIUM 600) 600 MG TABS tablet Take 1 tablet by mouth daily.    Marland Kitchen inFLIXimab (REMICADE) 100 MG injection Inject 100 mg into the vein every 8 (eight) weeks.     No current facility-administered medications for this visit.    Allergies:   Prochlorperazine edisylate; Codeine; and Tape    Social History:  The patient  reports that she has never smoked. She has never used smokeless tobacco. She reports that she drinks about 1.2 oz of alcohol per week. She reports that she does not use illicit drugs.   Family History:  The patient's family history includes Colon cancer in her maternal aunt and paternal uncle; Heart disease in her father and mother. There is no history of Colon polyps, Diabetes, Kidney disease, or Esophageal cancer.    ROS:  Please see the history of  present illness.    Otherwise, review of systems positive for none.   All other systems are reviewed and negative.    PHYSICAL EXAM: VS:  BP 142/78 mmHg  Pulse 58  Ht 5\' 8"  (1.727 m)  Wt 59.24 kg (130 lb 9.6 oz)  BMI 19.86 kg/m2 , BMI Body mass index is 19.86 kg/(m^2).  General: Alert, oriented x3, no distress Head: no evidence of trauma, PERRL, EOMI, no exophtalmos or lid lag, no myxedema, no xanthelasma; normal ears, nose and oropharynx Neck: normal jugular venous pulsations and no hepatojugular reflux; brisk carotid pulses without delay and no carotid bruits Chest: clear to auscultation, no signs of consolidation by percussion or palpation, normal fremitus, symmetrical and full respiratory excursions Cardiovascular: normal position and quality of the apical impulse, regular rhythm, normal first and second heart sounds, no murmurs, rubs or gallops Abdomen: no tenderness or distention, no masses by palpation, no abnormal pulsatility or arterial bruits, normal bowel sounds, no hepatosplenomegaly Extremities: no clubbing, cyanosis or edema; 2+ radial, ulnar and brachial pulses bilaterally; 2+ right femoral, posterior tibial and dorsalis pedis pulses; 2+ left femoral, posterior tibial and dorsalis pedis pulses; no subclavian or femoral bruits Neurological: grossly nonfocal Psych: euthymic mood, full affect   Recent Labs: 01/13/2015: BUN 14; Creatinine, Ser 0.76; Hemoglobin 15.1*; Platelets 233; Potassium 3.7; Sodium 138; TSH 1.211 01/14/2015: ALT 25    Lipid Panel No results found for: CHOL, TRIG, HDL, CHOLHDL, VLDL, LDLCALC, LDLDIRECT    Wt Readings from Last 3 Encounters:  03/08/15 59.24 kg (130 lb 9.6 oz)  01/13/15 59.376 kg (130 lb 14.4 oz)  09/09/14 60.782 kg (134 lb)     ASSESSMENT AND PLAN:  At this point in time Patricia Long does not appear to have any active cardiac issues. A fairly extensive noninvasive workup has not identified any evidence of cardiac or vascular  disease other than the presence of atherosclerosis in her thoracic aorta by chest x-ray.  The focus needs to be on prevention of cardiac and vascular events, the primary areas of focus being her blood pressure and cholesterol levels. She has not had a lipid profile. Due to the presence of calcium in her aorta would recommend a target LDL C level of less than 100 mg/deciliter.  I believe that Patricia Long does have systemic hypertension, albeit mild. She is already eating a fairly healthy diet, is physically active and lean. I believe she may be able to make some inroads in her systolic blood pressure by eliminating added salt to her diet and paying attention to the salt in the food that she buys. She might need antihypertensives medication if this  doesn't work and a couple of months. We reviewed the DASH diet in detail.  As long as these 2 issues are addressed, she does not require routine cardiology follow-up. Have asked her to call me if she wants me to follow-up on these parameters. Clearly, she wants to avoid starting any new medications.   Current medicines are reviewed at length with the patient today.  The patient does not have concerns regarding medicines.  The following changes have been made:  no change  Labs/ tests ordered today include:  No orders of the defined types were placed in this encounter.   Patient Instructions  Dr. Sallyanne Kuster recommends that you schedule a follow-up appointment in: AS NEEDED.      Mikael Spray, MD  03/08/2015 5:32 PM    Sanda Klein, MD, [facility] [phone] office [phone] pager

## 2015-03-09 DIAGNOSIS — M0579 Rheumatoid arthritis with rheumatoid factor of multiple sites without organ or systems involvement: Secondary | ICD-10-CM | POA: Diagnosis not present

## 2015-03-22 ENCOUNTER — Other Ambulatory Visit: Payer: Self-pay

## 2015-03-23 DIAGNOSIS — R51 Headache: Secondary | ICD-10-CM | POA: Diagnosis not present

## 2015-03-23 DIAGNOSIS — W57XXXA Bitten or stung by nonvenomous insect and other nonvenomous arthropods, initial encounter: Secondary | ICD-10-CM | POA: Diagnosis not present

## 2015-04-27 DIAGNOSIS — M0589 Other rheumatoid arthritis with rheumatoid factor of multiple sites: Secondary | ICD-10-CM | POA: Diagnosis not present

## 2015-04-27 DIAGNOSIS — Z79899 Other long term (current) drug therapy: Secondary | ICD-10-CM | POA: Diagnosis not present

## 2015-04-27 DIAGNOSIS — M2550 Pain in unspecified joint: Secondary | ICD-10-CM | POA: Diagnosis not present

## 2015-05-04 DIAGNOSIS — M0589 Other rheumatoid arthritis with rheumatoid factor of multiple sites: Secondary | ICD-10-CM | POA: Diagnosis not present

## 2015-05-25 DIAGNOSIS — H539 Unspecified visual disturbance: Secondary | ICD-10-CM | POA: Diagnosis not present

## 2015-05-26 DIAGNOSIS — G43B0 Ophthalmoplegic migraine, not intractable: Secondary | ICD-10-CM | POA: Diagnosis not present

## 2015-06-17 DIAGNOSIS — M069 Rheumatoid arthritis, unspecified: Secondary | ICD-10-CM | POA: Diagnosis not present

## 2015-06-17 DIAGNOSIS — M3500 Sicca syndrome, unspecified: Secondary | ICD-10-CM | POA: Diagnosis not present

## 2015-06-17 DIAGNOSIS — Z79899 Other long term (current) drug therapy: Secondary | ICD-10-CM | POA: Diagnosis not present

## 2015-06-30 DIAGNOSIS — H2513 Age-related nuclear cataract, bilateral: Secondary | ICD-10-CM | POA: Diagnosis not present

## 2015-06-30 DIAGNOSIS — H04123 Dry eye syndrome of bilateral lacrimal glands: Secondary | ICD-10-CM | POA: Diagnosis not present

## 2015-06-30 DIAGNOSIS — H43391 Other vitreous opacities, right eye: Secondary | ICD-10-CM | POA: Diagnosis not present

## 2015-06-30 DIAGNOSIS — D3132 Benign neoplasm of left choroid: Secondary | ICD-10-CM | POA: Diagnosis not present

## 2015-07-01 DIAGNOSIS — M0589 Other rheumatoid arthritis with rheumatoid factor of multiple sites: Secondary | ICD-10-CM | POA: Diagnosis not present

## 2015-07-27 DIAGNOSIS — M0589 Other rheumatoid arthritis with rheumatoid factor of multiple sites: Secondary | ICD-10-CM | POA: Diagnosis not present

## 2015-07-27 DIAGNOSIS — J00 Acute nasopharyngitis [common cold]: Secondary | ICD-10-CM | POA: Diagnosis not present

## 2015-07-27 DIAGNOSIS — Z79899 Other long term (current) drug therapy: Secondary | ICD-10-CM | POA: Diagnosis not present

## 2015-07-27 DIAGNOSIS — M2550 Pain in unspecified joint: Secondary | ICD-10-CM | POA: Diagnosis not present

## 2015-08-26 DIAGNOSIS — M0589 Other rheumatoid arthritis with rheumatoid factor of multiple sites: Secondary | ICD-10-CM | POA: Diagnosis not present

## 2015-09-06 ENCOUNTER — Emergency Department (HOSPITAL_COMMUNITY): Payer: No Typology Code available for payment source

## 2015-09-06 ENCOUNTER — Emergency Department (HOSPITAL_COMMUNITY)
Admission: EM | Admit: 2015-09-06 | Discharge: 2015-09-06 | Disposition: A | Discharge status: Home / Self Care | Payer: No Typology Code available for payment source | Attending: Emergency Medicine | Admitting: Emergency Medicine

## 2015-09-06 ENCOUNTER — Encounter (HOSPITAL_COMMUNITY): Payer: Self-pay | Admitting: Emergency Medicine

## 2015-09-06 DIAGNOSIS — S199XXA Unspecified injury of neck, initial encounter: Secondary | ICD-10-CM | POA: Diagnosis not present

## 2015-09-06 DIAGNOSIS — Z8709 Personal history of other diseases of the respiratory system: Secondary | ICD-10-CM | POA: Insufficient documentation

## 2015-09-06 DIAGNOSIS — Z8742 Personal history of other diseases of the female genital tract: Secondary | ICD-10-CM | POA: Diagnosis not present

## 2015-09-06 DIAGNOSIS — Z8739 Personal history of other diseases of the musculoskeletal system and connective tissue: Secondary | ICD-10-CM | POA: Insufficient documentation

## 2015-09-06 DIAGNOSIS — Y998 Other external cause status: Secondary | ICD-10-CM | POA: Diagnosis not present

## 2015-09-06 DIAGNOSIS — Z8719 Personal history of other diseases of the digestive system: Secondary | ICD-10-CM | POA: Insufficient documentation

## 2015-09-06 DIAGNOSIS — G44319 Acute post-traumatic headache, not intractable: Secondary | ICD-10-CM | POA: Diagnosis not present

## 2015-09-06 DIAGNOSIS — Y92410 Unspecified street and highway as the place of occurrence of the external cause: Secondary | ICD-10-CM | POA: Insufficient documentation

## 2015-09-06 DIAGNOSIS — Z85828 Personal history of other malignant neoplasm of skin: Secondary | ICD-10-CM | POA: Insufficient documentation

## 2015-09-06 DIAGNOSIS — Z79899 Other long term (current) drug therapy: Secondary | ICD-10-CM | POA: Insufficient documentation

## 2015-09-06 DIAGNOSIS — S060X0A Concussion without loss of consciousness, initial encounter: Secondary | ICD-10-CM

## 2015-09-06 DIAGNOSIS — S0990XA Unspecified injury of head, initial encounter: Secondary | ICD-10-CM | POA: Diagnosis present

## 2015-09-06 DIAGNOSIS — Z86018 Personal history of other benign neoplasm: Secondary | ICD-10-CM | POA: Insufficient documentation

## 2015-09-06 DIAGNOSIS — Y9389 Activity, other specified: Secondary | ICD-10-CM | POA: Diagnosis not present

## 2015-09-06 DIAGNOSIS — R51 Headache: Secondary | ICD-10-CM | POA: Diagnosis not present

## 2015-09-06 DIAGNOSIS — Z8701 Personal history of pneumonia (recurrent): Secondary | ICD-10-CM | POA: Diagnosis not present

## 2015-09-06 DIAGNOSIS — M542 Cervicalgia: Secondary | ICD-10-CM | POA: Diagnosis not present

## 2015-09-06 MED ORDER — ONDANSETRON HCL 4 MG PO TABS: 4.0000 mg | ORAL_TABLET | Freq: Three times a day (TID) | ORAL | Status: AC | PRN

## 2015-09-06 MED ORDER — DIPHENHYDRAMINE HCL 50 MG/ML IJ SOLN
12.5000 mg | Freq: Once | INTRAMUSCULAR | Status: AC
  Administered 2015-09-06: 12.5 mg via INTRAVENOUS
  Filled 2015-09-06: qty 1

## 2015-09-06 MED ORDER — METOCLOPRAMIDE HCL 5 MG/ML IJ SOLN
10.0000 mg | Freq: Once | INTRAMUSCULAR | Status: AC
  Administered 2015-09-06: 10 mg via INTRAVENOUS
  Filled 2015-09-06: qty 2

## 2015-09-06 MED ORDER — HYDROCODONE-ACETAMINOPHEN 5-325 MG PO TABS: 1.0000 | ORAL_TABLET | ORAL | Status: DC | PRN

## 2015-09-06 MED ORDER — KETOROLAC TROMETHAMINE 30 MG/ML IJ SOLN
30.0000 mg | Freq: Once | INTRAMUSCULAR | Status: AC
  Administered 2015-09-06: 30 mg via INTRAVENOUS
  Filled 2015-09-06: qty 1
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
## 2015-09-06 NOTE — ED Notes (Signed)
Vital signs stable. 

## 2015-09-06 NOTE — Discharge Instructions (Signed)
Concussion, Adult  A concussion, or closed-head injury, is a brain injury caused by a direct blow to the head or by a quick and sudden movement (jolt) of the head or neck. Concussions are usually not life-threatening. Even so, the effects of a concussion can be serious. If you have had a concussion before, you are more likely to experience concussion-like symptoms after a direct blow to the head.   CAUSES  · Direct blow to the head, such as from running into another player during a soccer game, being hit in a fight, or hitting your head on a hard surface.  · A jolt of the head or neck that causes the brain to move back and forth inside the skull, such as in a car crash.  SIGNS AND SYMPTOMS  The signs of a concussion can be hard to notice. Early on, they may be missed by you, family members, and health care providers. You may look fine but act or feel differently.  Symptoms are usually temporary, but they may last for days, weeks, or even longer. Some symptoms may appear right away while others may not show up for hours or days. Every head injury is different. Symptoms include:  · Mild to moderate headaches that will not go away.  · A feeling of pressure inside your head.  · Having more trouble than usual:    Learning or remembering things you have heard.    Answering questions.    Paying attention or concentrating.    Organizing daily tasks.    Making decisions and solving problems.  · Slowness in thinking, acting or reacting, speaking, or reading.  · Getting lost or being easily confused.  · Feeling tired all the time or lacking energy (fatigued).  · Feeling drowsy.  · Sleep disturbances.    Sleeping more than usual.    Sleeping less than usual.    Trouble falling asleep.    Trouble sleeping (insomnia).  · Loss of balance or feeling lightheaded or dizzy.  · Nausea or vomiting.  · Numbness or tingling.  · Increased sensitivity to:    Sounds.    Lights.    Distractions.  · Vision problems or eyes that tire  easily.  · Diminished sense of taste or smell.  · Ringing in the ears.  · Mood changes such as feeling sad or anxious.  · Becoming easily irritated or angry for little or no reason.  · Lack of motivation.  · Seeing or hearing things other people do not see or hear (hallucinations).  DIAGNOSIS  Your health care provider can usually diagnose a concussion based on a description of your injury and symptoms. He or she will ask whether you passed out (lost consciousness) and whether you are having trouble remembering events that happened right before and during your injury.  Your evaluation might include:  · A brain scan to look for signs of injury to the brain. Even if the test shows no injury, you may still have a concussion.  · Blood tests to be sure other problems are not present.  TREATMENT  · Concussions are usually treated in an emergency department, in urgent care, or at a clinic. You may need to stay in the hospital overnight for further treatment.  · Tell your health care provider if you are taking any medicines, including prescription medicines, over-the-counter medicines, and natural remedies. Some medicines, such as blood thinners (anticoagulants) and aspirin, may increase the chance of complications. Also tell your health care   provider whether you have had alcohol or are taking illegal drugs. This information may affect treatment.  · Your health care provider will send you home with important instructions to follow.  · How fast you will recover from a concussion depends on many factors. These factors include how severe your concussion is, what part of your brain was injured, your age, and how healthy you were before the concussion.  · Most people with mild injuries recover fully. Recovery can take time. In general, recovery is slower in older persons. Also, persons who have had a concussion in the past or have other medical problems may find that it takes longer to recover from their current injury.  HOME  CARE INSTRUCTIONS  General Instructions  · Carefully follow the directions your health care provider gave you.  · Only take over-the-counter or prescription medicines for pain, discomfort, or fever as directed by your health care provider.  · Take only those medicines that your health care provider has approved.  · Do not drink alcohol until your health care provider says you are well enough to do so. Alcohol and certain other drugs may slow your recovery and can put you at risk of further injury.  · If it is harder than usual to remember things, write them down.  · If you are easily distracted, try to do one thing at a time. For example, do not try to watch TV while fixing dinner.  · Talk with family members or close friends when making important decisions.  · Keep all follow-up appointments. Repeated evaluation of your symptoms is recommended for your recovery.  · Watch your symptoms and tell others to do the same. Complications sometimes occur after a concussion. Older adults with a brain injury may have a higher risk of serious complications, such as a blood clot on the brain.  · Tell your teachers, school nurse, school counselor, coach, athletic trainer, or work manager about your injury, symptoms, and restrictions. Tell them about what you can or cannot do. They should watch for:    Increased problems with attention or concentration.    Increased difficulty remembering or learning new information.    Increased time needed to complete tasks or assignments.    Increased irritability or decreased ability to cope with stress.    Increased symptoms.  · Rest. Rest helps the brain to heal. Make sure you:    Get plenty of sleep at night. Avoid staying up late at night.    Keep the same bedtime hours on weekends and weekdays.    Rest during the day. Take daytime naps or rest breaks when you feel tired.  · Limit activities that require a lot of thought or concentration. These include:    Doing homework or job-related  work.    Watching TV.    Working on the computer.  · Avoid any situation where there is potential for another head injury (football, hockey, soccer, basketball, martial arts, downhill snow sports and horseback riding). Your condition will get worse every time you experience a concussion. You should avoid these activities until you are evaluated by the appropriate follow-up health care providers.  Returning To Your Regular Activities  You will need to return to your normal activities slowly, not all at once. You must give your body and brain enough time for recovery.  · Do not return to sports or other athletic activities until your health care provider tells you it is safe to do so.  · Ask   your health care provider when you can drive, ride a bicycle, or operate heavy machinery. Your ability to react may be slower after a brain injury. Never do these activities if you are dizzy.  · Ask your health care provider about when you can return to work or school.  Preventing Another Concussion  It is very important to avoid another brain injury, especially before you have recovered. In rare cases, another injury can lead to permanent brain damage, brain swelling, or death. The risk of this is greatest during the first 7-10 days after a head injury. Avoid injuries by:  · Wearing a seat belt when riding in a car.  · Drinking alcohol only in moderation.  · Wearing a helmet when biking, skiing, skateboarding, skating, or doing similar activities.  · Avoiding activities that could lead to a second concussion, such as contact or recreational sports, until your health care provider says it is okay.  · Taking safety measures in your home.    Remove clutter and tripping hazards from floors and stairways.    Use grab bars in bathrooms and handrails by stairs.    Place non-slip mats on floors and in bathtubs.    Improve lighting in dim areas.  SEEK MEDICAL CARE IF:  · You have increased problems paying attention or  concentrating.  · You have increased difficulty remembering or learning new information.  · You need more time to complete tasks or assignments than before.  · You have increased irritability or decreased ability to cope with stress.  · You have more symptoms than before.  Seek medical care if you have any of the following symptoms for more than 2 weeks after your injury:  · Lasting (chronic) headaches.  · Dizziness or balance problems.  · Nausea.  · Vision problems.  · Increased sensitivity to noise or light.  · Depression or mood swings.  · Anxiety or irritability.  · Memory problems.  · Difficulty concentrating or paying attention.  · Sleep problems.  · Feeling tired all the time.  SEEK IMMEDIATE MEDICAL CARE IF:  · You have severe or worsening headaches. These may be a sign of a blood clot in the brain.  · You have weakness (even if only in one hand, leg, or part of the face).  · You have numbness.  · You have decreased coordination.  · You vomit repeatedly.  · You have increased sleepiness.  · One pupil is larger than the other.  · You have convulsions.  · You have slurred speech.  · You have increased confusion. This may be a sign of a blood clot in the brain.  · You have increased restlessness, agitation, or irritability.  · You are unable to recognize people or places.  · You have neck pain.  · It is difficult to wake you up.  · You have unusual behavior changes.  · You lose consciousness.  MAKE SURE YOU:  · Understand these instructions.  · Will watch your condition.  · Will get help right away if you are not doing well or get worse.     This information is not intended to replace advice given to you by your health care provider. Make sure you discuss any questions you have with your health care provider.     Document Released: 12/02/2003 Document Revised: 10/02/2014 Document Reviewed: 04/03/2013  Elsevier Interactive Patient Education ©2016 [employer]

## 2015-09-14 ENCOUNTER — Encounter: Payer: Self-pay | Admitting: Women's Health

## 2015-09-14 ENCOUNTER — Ambulatory Visit (INDEPENDENT_AMBULATORY_CARE_PROVIDER_SITE_OTHER): Payer: Medicare Other | Admitting: Women's Health

## 2015-09-14 VITALS — BP 118/80 | Wt 125.0 lb

## 2015-09-14 DIAGNOSIS — N952 Postmenopausal atrophic vaginitis: Secondary | ICD-10-CM | POA: Diagnosis not present

## 2015-09-14 DIAGNOSIS — Z79890 Hormone replacement therapy (postmenopausal): Secondary | ICD-10-CM

## 2015-09-14 DIAGNOSIS — Z01419 Encounter for gynecological examination (general) (routine) without abnormal findings: Secondary | ICD-10-CM

## 2015-09-14 MED ORDER — ESTRADIOL 1 MG PO TABS: 1.0000 mg | ORAL_TABLET | Freq: Every day | ORAL | Status: DC

## 2015-09-14 NOTE — Patient Instructions (Signed)
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
## 2015-09-24 DIAGNOSIS — J069 Acute upper respiratory infection, unspecified: Secondary | ICD-10-CM | POA: Diagnosis not present

## 2015-09-29 DIAGNOSIS — J209 Acute bronchitis, unspecified: Secondary | ICD-10-CM | POA: Diagnosis not present

## 2015-10-14 DIAGNOSIS — Z1231 Encounter for screening mammogram for malignant neoplasm of breast: Secondary | ICD-10-CM | POA: Diagnosis not present

## 2015-10-14 DIAGNOSIS — Z803 Family history of malignant neoplasm of breast: Secondary | ICD-10-CM | POA: Diagnosis not present

## 2015-10-15 ENCOUNTER — Encounter: Payer: Self-pay | Admitting: Women's Health

## 2015-10-17 ENCOUNTER — Encounter: Payer: Self-pay | Admitting: Women's Health

## 2015-10-19 ENCOUNTER — Encounter: Payer: Self-pay | Admitting: Neurology

## 2015-10-19 ENCOUNTER — Ambulatory Visit (INDEPENDENT_AMBULATORY_CARE_PROVIDER_SITE_OTHER): Payer: Medicare Other | Admitting: Neurology

## 2015-10-19 VITALS — BP 130/80 | HR 65 | Wt 125.6 lb

## 2015-10-19 DIAGNOSIS — F0781 Postconcussional syndrome: Secondary | ICD-10-CM | POA: Diagnosis not present

## 2015-10-19 MED ORDER — AMITRIPTYLINE HCL 10 MG PO TABS: ORAL_TABLET | ORAL | Status: DC
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
## 2015-10-20 DIAGNOSIS — M0579 Rheumatoid arthritis with rheumatoid factor of multiple sites without organ or systems involvement: Secondary | ICD-10-CM | POA: Diagnosis not present

## 2015-10-21 DIAGNOSIS — M069 Rheumatoid arthritis, unspecified: Secondary | ICD-10-CM | POA: Diagnosis not present

## 2015-10-21 DIAGNOSIS — M3500 Sicca syndrome, unspecified: Secondary | ICD-10-CM | POA: Diagnosis not present

## 2015-10-21 DIAGNOSIS — Z79899 Other long term (current) drug therapy: Secondary | ICD-10-CM | POA: Diagnosis not present

## 2015-10-21 DIAGNOSIS — M8580 Other specified disorders of bone density and structure, unspecified site: Secondary | ICD-10-CM | POA: Diagnosis not present

## 2015-10-25 ENCOUNTER — Encounter: Payer: Self-pay | Admitting: Neurology

## 2015-10-25 DIAGNOSIS — F0781 Postconcussional syndrome: Secondary | ICD-10-CM | POA: Insufficient documentation

## 2015-11-03 DIAGNOSIS — M2550 Pain in unspecified joint: Secondary | ICD-10-CM | POA: Diagnosis not present

## 2015-11-03 DIAGNOSIS — Z79899 Other long term (current) drug therapy: Secondary | ICD-10-CM | POA: Diagnosis not present

## 2015-11-03 DIAGNOSIS — M0589 Other rheumatoid arthritis with rheumatoid factor of multiple sites: Secondary | ICD-10-CM | POA: Diagnosis not present

## 2015-12-09 DIAGNOSIS — E785 Hyperlipidemia, unspecified: Secondary | ICD-10-CM | POA: Diagnosis not present

## 2015-12-09 DIAGNOSIS — Z79899 Other long term (current) drug therapy: Secondary | ICD-10-CM | POA: Diagnosis not present

## 2015-12-09 DIAGNOSIS — M0540 Rheumatoid myopathy with rheumatoid arthritis of unspecified site: Secondary | ICD-10-CM | POA: Diagnosis not present

## 2015-12-09 DIAGNOSIS — Z0001 Encounter for general adult medical examination with abnormal findings: Secondary | ICD-10-CM | POA: Diagnosis not present

## 2015-12-09 DIAGNOSIS — Z13820 Encounter for screening for osteoporosis: Secondary | ICD-10-CM | POA: Diagnosis not present

## 2015-12-09 DIAGNOSIS — K219 Gastro-esophageal reflux disease without esophagitis: Secondary | ICD-10-CM | POA: Diagnosis not present

## 2015-12-09 DIAGNOSIS — Z1211 Encounter for screening for malignant neoplasm of colon: Secondary | ICD-10-CM | POA: Diagnosis not present

## 2015-12-09 DIAGNOSIS — M8580 Other specified disorders of bone density and structure, unspecified site: Secondary | ICD-10-CM | POA: Diagnosis not present

## 2015-12-15 DIAGNOSIS — Z79899 Other long term (current) drug therapy: Secondary | ICD-10-CM | POA: Diagnosis not present

## 2015-12-15 DIAGNOSIS — M0589 Other rheumatoid arthritis with rheumatoid factor of multiple sites: Secondary | ICD-10-CM | POA: Diagnosis not present

## 2015-12-21 DIAGNOSIS — Z79899 Other long term (current) drug therapy: Secondary | ICD-10-CM | POA: Diagnosis not present

## 2015-12-21 DIAGNOSIS — M0540 Rheumatoid myopathy with rheumatoid arthritis of unspecified site: Secondary | ICD-10-CM | POA: Diagnosis not present

## 2015-12-21 DIAGNOSIS — M859 Disorder of bone density and structure, unspecified: Secondary | ICD-10-CM | POA: Diagnosis not present

## 2015-12-21 DIAGNOSIS — E785 Hyperlipidemia, unspecified: Secondary | ICD-10-CM | POA: Diagnosis not present

## 2015-12-27 ENCOUNTER — Ambulatory Visit (INDEPENDENT_AMBULATORY_CARE_PROVIDER_SITE_OTHER): Payer: Medicare Other | Admitting: Neurology

## 2015-12-27 ENCOUNTER — Encounter: Payer: Self-pay | Admitting: Neurology

## 2015-12-27 VITALS — BP 136/80 | HR 67 | Resp 16 | Wt 127.0 lb

## 2015-12-27 DIAGNOSIS — F0781 Postconcussional syndrome: Secondary | ICD-10-CM | POA: Diagnosis not present

## 2015-12-27 DIAGNOSIS — M542 Cervicalgia: Secondary | ICD-10-CM | POA: Diagnosis not present

## 2015-12-27 MED ORDER — AMITRIPTYLINE HCL 10 MG PO TABS: ORAL_TABLET | ORAL | Status: DC

## 2015-12-27 NOTE — Patient Instructions (Signed)
1. Increase amitriptyline 10mg : Take 3 capsules at night for 1 week, then increase to 4 capsules at night 2. Refer to Physical Therapy for neck pain 3. Call our office in a month to update [place] on how you are feeling 4. Follow-up in 6 months
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
## 2015-12-28 ENCOUNTER — Encounter: Payer: Self-pay | Admitting: Neurology

## 2015-12-28 ENCOUNTER — Other Ambulatory Visit: Payer: Self-pay

## 2015-12-28 DIAGNOSIS — M542 Cervicalgia: Secondary | ICD-10-CM | POA: Insufficient documentation

## 2015-12-28 DIAGNOSIS — Z79890 Hormone replacement therapy (postmenopausal): Secondary | ICD-10-CM

## 2015-12-28 MED ORDER — ESTRADIOL 1 MG PO TABS: 1.0000 mg | ORAL_TABLET | Freq: Every day | ORAL | Status: DC

## 2015-12-30 ENCOUNTER — Ambulatory Visit: Payer: No Typology Code available for payment source | Attending: Neurology | Admitting: Physical Therapy

## 2015-12-30 DIAGNOSIS — M542 Cervicalgia: Secondary | ICD-10-CM | POA: Diagnosis not present

## 2015-12-30 DIAGNOSIS — M6281 Muscle weakness (generalized): Secondary | ICD-10-CM | POA: Insufficient documentation

## 2015-12-30 DIAGNOSIS — G44329 Chronic post-traumatic headache, not intractable: Secondary | ICD-10-CM
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
## 2015-12-30 NOTE — Patient Instructions (Signed)
Watch posture!  Use a lumbar roll to line everything up-  Ears over shoulders.   Shoulder blade squeezes 8-10x 3x/day     Ruben Im PT [facility] [address], [place], [place] [address] Phone # [phone] Fax [phone]

## 2016-01-06 ENCOUNTER — Ambulatory Visit: Payer: No Typology Code available for payment source | Admitting: Physical Therapy

## 2016-01-06 DIAGNOSIS — M6281 Muscle weakness (generalized): Secondary | ICD-10-CM | POA: Diagnosis not present

## 2016-01-06 DIAGNOSIS — R7989 Other specified abnormal findings of blood chemistry: Secondary | ICD-10-CM | POA: Diagnosis not present

## 2016-01-06 DIAGNOSIS — M542 Cervicalgia: Secondary | ICD-10-CM | POA: Diagnosis not present

## 2016-01-06 DIAGNOSIS — G44329 Chronic post-traumatic headache, not intractable: Secondary | ICD-10-CM

## 2016-01-06 DIAGNOSIS — Z79899 Other long term (current) drug therapy: Secondary | ICD-10-CM | POA: Diagnosis not present
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
## 2016-01-06 NOTE — Patient Instructions (Signed)
       Copyright  VHI. All rights reserved.  Side-Bending   One hand on opposite side of head, pull head to side as far as is comfortable. Stop if there is pain. Hold ___20-30_ seconds. Repeat with other hand to other side. Repeat _3___ times. Do ___3_ sessions per day.   Copyright  VHI. All rights reserved.  Scapular Retraction (Standing)   With arms at sides, pinch shoulder blades together. Repeat _10___ times per set. Do __1_ sets per session. Do __3__ sessions per day.  http://orth.exer.us/944   Copyright  VHI. All rights reserved.   Trigger Point Dry Needling  . What is Trigger Point Dry Needling (DN)? o DN is a physical therapy technique used to treat muscle pain and dysfunction. Specifically, DN helps deactivate muscle trigger points (muscle knots).  o A thin filiform needle is used to penetrate the skin and stimulate the underlying trigger point. The goal is for a local twitch response (LTR) to occur and for the trigger point to relax. No medication of any kind is injected during the procedure.   . What Does Trigger Point Dry Needling Feel Like?  o The procedure feels different for each individual patient. Some patients report that they do not actually feel the needle enter the skin and overall the process is not painful. Very mild bleeding may occur. However, many patients feel a deep cramping in the muscle in which the needle was inserted. This is the local twitch response.   Marland Kitchen How Will I feel after the treatment? o Soreness is normal, and the onset of soreness may not occur for a few hours. Typically this soreness does not last longer than two days.  o Bruising is uncommon, however; ice can be used to decrease any possible bruising.  o In rare cases feeling tired or nauseous after the treatment is normal. In addition, your symptoms may get worse before they get better, this period will typically not last longer than 24 hours.   . What Can I do After My  Treatment? o Increase your hydration by drinking more water for the next 24 hours. o You may place ice or heat on the areas treated that have become sore, however, do not use heat on inflamed or bruised areas. Heat often brings more relief post needling. o You can continue your regular activities, but vigorous activity is not recommended initially after the treatment for 24 hours. o DN is best combined with other physical therapy such as strengthening, stretching, and other therapies.   Ruben Im PT [facility] [address] [place], [place] [address] Phone # [phone] Fax [phone]

## 2016-01-10 ENCOUNTER — Encounter: Payer: Medicare Other | Admitting: Physical Therapy

## 2016-01-10 ENCOUNTER — Ambulatory Visit: Payer: No Typology Code available for payment source

## 2016-01-10 DIAGNOSIS — M6281 Muscle weakness (generalized): Secondary | ICD-10-CM

## 2016-01-10 DIAGNOSIS — G44329 Chronic post-traumatic headache, not intractable: Secondary | ICD-10-CM

## 2016-01-10 DIAGNOSIS — M542 Cervicalgia: Secondary | ICD-10-CM | POA: Diagnosis not present

## 2016-01-10 NOTE — Therapy (Signed)
[facility] [address], [place], [place], [address] Phone: [phone]   Fax:  [phone]  Physical Therapy Treatment  Patient Details  Name: Patricia Long MRN: [id number] Date of Birth: [birth year] Referring Provider: Dr. Delice Lesch  Encounter Date: 01/10/2016      PT End of Session - 01/10/16 1655    Visit Number 3   Number of Visits 10   Date for PT Re-Evaluation 02/24/16   Authorization Type Medicare G codes at 23;  KX at 8   PT Start Time 1615   PT Stop Time 1656   PT Time Calculation (min) 41 min   Activity Tolerance Patient tolerated treatment well   Behavior During Therapy [facility] for tasks assessed/performed      Past Medical History  Diagnosis Date  . Endometriosis   . Fibroid   . Irritable bowel syndrome   . Basal cell carcinoma ~ 2013    "legs"  . PONV (postoperative nausea and vomiting)   . Pneumonia 1970's X 1; ~ 2013  . Chronic bronchitis ([place])     "I've had it several times but not q yr" (01/13/2015)  . Rheumatoid arthritis(714.0)     Past Surgical History  Procedure Laterality Date  . Total abdominal hysterectomy  1997    TAH/BSO  . Pelvic laparoscopy  '82 AND '87  . Foot surgery Bilateral ~ 2006-2008    "reconstruction"  . Joint replacement Left 2009    WRIST AND FINGER   . Knee arthroscopy Right 1980's  . Breast cyst excision Bilateral 1992    NODULE EXCISION OF RIGHT AND LEFT BREAST  . Basal cell carcinoma excision  ~ 2013    "legs"    There were no vitals filed for this visit.      Subjective Assessment - 01/10/16 1617    Subjective Pt reports that she is worse after last session.  Muscle pain after needling.     Patient Stated Goals I want the pain to go away, turn without hurting   Currently in Pain? Yes   Pain Score 5    Pain Location Neck   Pain Orientation Right;Left   Pain Descriptors / Indicators Aching;Shooting   Pain Type Acute pain   Pain Onset More than a  month ago   Pain Frequency Constant   Aggravating Factors  turning head to the Rt, moving head.     Pain Relieving Factors TENs at home, not turning the head.            [facility] PT Assessment - 01/10/16 0001    AROM   Cervical - Right Rotation 36   Cervical - Left Rotation 60                     [facility] Adult PT Treatment/Exercise - 01/10/16 0001    Exercises   Exercises Neck;Shoulder   Shoulder Exercises: Supine   Horizontal ABduction Strengthening;20 reps;Theraband   Theraband Level (Shoulder Horizontal ABduction) Level 1 (Yellow)   Other Supine Exercises supine decompression x 5 minutes, then scapular presses 2x10   Manual Therapy   Manual Therapy Soft tissue mobilization;Myofascial release   Soft tissue mobilization B suboccipitals, cervical multifidi, upper traps, levators, rhomboids   Neck Exercises: Stretches   Upper Trapezius Stretch 3 reps;20 seconds  R/L                  PT Short Term Goals - 01/10/16 1619    PT SHORT  TERM GOAL #1   Title The patient will demonstrate improved postural awareness with sitting to promote healing of soft tissue   01/27/16   Time 4   Period Weeks   Status Achieved   PT SHORT TERM GOAL #2   Title The patient will report a 30% improvement in pain with usual ADLs   Time 4   Period Weeks   Status On-going   PT SHORT TERM GOAL #3   Title Right cervical rotation improved to 35 degrees needed for greater ease with driving   Time 4   Period Weeks   Status Achieved           PT Long Term Goals - 01/06/16 1006    PT LONG TERM GOAL #1   Title The patient will be independent in safe self progression of HEP  02/24/16   Time 8   Period Weeks   Status On-going   PT LONG TERM GOAL #2   Title The patient will have improved cervical rotation to the right to 40 degrees needed for backing her car into the garage   Time 8   Period Weeks   Status On-going   PT LONG TERM GOAL #3   Title The patient will have improved  deep cervical flexor, extensor and scapulothoracic muscle strength to grossly 4+/5 needed for standing, walking, sitting longer periods of time before postural fatigue   Time 8   Period Weeks   Status On-going   PT LONG TERM GOAL #4   Title Overall pain improved by 60% with usual ADLs like housework and driving   Time 8   Period Weeks   Status On-going   PT LONG TERM GOAL #5   Title FOTO functional outcome score improved from 50% limitation to 38% indicating improved function with less pain   Time 8   Period Weeks   Status On-going               Plan - 01/10/16 1623    Clinical Impression Statement Pt reports that pain is the same since the start of care. Pt with incresaed pain after last treatment session.  Pt with improved postural awareness and is making postural corrections throughout the day.  Pt demonstrates improved cervical AROM with Rt rotation to 35 degrees and Lt rotation to 60 degrees.  Pt with pain upon Rt rotation.  Pt with continued trigger points in the Rt Upper trap and cervical paraspinals.  Pt will continue to benefit from skilled PT for flexibility, strength, manual and modalities as needed.     Rehab Potential Good   Clinical Impairments Affecting Rehab Potential rheumatoid arthritis-no cervical retractions   PT Frequency 2x / week   PT Duration 8 weeks   PT Treatment/Interventions ADLs/Self Care Home Management;Cryotherapy;Electrical Stimulation;Moist Heat;Therapeutic exercise;Ultrasound;Patient/family education;Manual techniques;Taping;Dry needling   PT Next Visit Plan Postural strength, gentle flexibility, manual    Consulted and Agree with Plan of Care Patient      Patient will benefit from skilled therapeutic intervention in order to improve the following deficits and impairments:  Pain, Decreased strength, Increased muscle spasms, Decreased range of motion  Visit Diagnosis: Cervicalgia  Chronic post-traumatic headache, not intractable  Muscle  weakness (generalized)     Problem List Patient Active Problem List   Diagnosis Date Noted  . Neck pain on right side 12/28/2015  . Postconcussion syndrome 10/25/2015  . Aortic arch atherosclerosis ([place]) 03/08/2015  . Hypertension 01/13/2015  . Chest pain 01/13/2015  . Labile hypertension  01/13/2015  . Rheumatoid arthritis ([place]) 08/29/2013  . ABDOMINAL PAIN, EPIGASTRIC 08/02/2009  . GERD 07/30/2009  . CONSTIPATION 07/30/2009  . IRRITABLE BOWEL SYNDROME 07/30/2009  . NAUSEA AND VOMITING 07/30/2009  . Nausea without vomiting 07/30/2009  . ABDOMINAL PAIN, GENERALIZED 07/30/2009    Sigurd Sos, PT 01/10/2016 4:57 PM  [facility] [address] [place], [place], [address] Phone: [phone]   Fax:  [phone]  Name: JANITH FRANCOEUR MRN: [id number] Date of Birth: 1951/01/03

## 2016-01-12 ENCOUNTER — Ambulatory Visit: Payer: No Typology Code available for payment source | Admitting: Physical Therapy

## 2016-01-12 ENCOUNTER — Encounter: Payer: Self-pay | Admitting: Physical Therapy

## 2016-01-12 DIAGNOSIS — M6281 Muscle weakness (generalized): Secondary | ICD-10-CM | POA: Diagnosis not present

## 2016-01-12 DIAGNOSIS — M542 Cervicalgia: Secondary | ICD-10-CM

## 2016-01-12 DIAGNOSIS — G44329 Chronic post-traumatic headache, not intractable: Secondary | ICD-10-CM

## 2016-01-12 NOTE — Therapy (Signed)
[facility] [address] [place], [place], [address] Phone: [phone]   Fax:  [phone]  Physical Therapy Treatment  Patient Details  Name: Patricia Long MRN: [id number] Date of Birth: [birth year] Referring Provider: Dr. Delice Lesch  Encounter Date: 01/12/2016      PT End of Session - 01/12/16 1043    Visit Number 4   Number of Visits 10   Date for PT Re-Evaluation 02/24/16   Authorization Type Medicare G codes at 30;  KX at 40   PT Start Time 1015   PT Stop Time 1115   PT Time Calculation (min) 60 min   Activity Tolerance Patient tolerated treatment well   Behavior During Therapy [employer] [facility] for tasks assessed/performed      Past Medical History  Diagnosis Date  . Endometriosis   . Fibroid   . Irritable bowel syndrome   . Basal cell carcinoma ~ 2013    "legs"  . PONV (postoperative nausea and vomiting)   . Pneumonia 1970's X 1; ~ 2013  . Chronic bronchitis ([place])     "I've had it several times but not q yr" (01/13/2015)  . Rheumatoid arthritis(714.0)     Past Surgical History  Procedure Laterality Date  . Total abdominal hysterectomy  1997    TAH/BSO  . Pelvic laparoscopy  '82 AND '87  . Foot surgery Bilateral ~ 2006-2008    "reconstruction"  . Joint replacement Left 2009    WRIST AND FINGER   . Knee arthroscopy Right 1980's  . Breast cyst excision Bilateral 1992    NODULE EXCISION OF RIGHT AND LEFT BREAST  . Basal cell carcinoma excision  ~ 2013    "legs"    There were no vitals filed for this visit.      Subjective Assessment - 01/12/16 1018    Subjective My neck feels "burned." The only thing that has felt good is the soft tissue work.    Currently in Pain? Yes   Pain Score 5    Pain Location Scapula   Pain Orientation Upper   Pain Descriptors / Indicators Aching;Sore   Aggravating Factors  turning head to the RT creates stabbing pain.   Pain Relieving Factors Home TENS, not turning.    Multiple Pain Sites No                         [facility] Adult PT Treatment/Exercise - 01/12/16 0001    Neck Exercises: Supine   Other Supine Exercise MELT cervical release series with soft foam roll   Moist Heat Therapy   Number Minutes Moist Heat 15 Minutes   Moist Heat Location Cervical   Electrical Stimulation   Electrical Stimulation Location Scapula to cervical    Electrical Stimulation Action IFC   Electrical Stimulation Parameters 80-150 HZ   Electrical Stimulation Goals Pain   Manual Therapy   Manual Therapy Soft tissue mobilization;Myofascial release   Soft tissue mobilization RT side of cervical muscles  SCM, scalenes, subocciciptals                  PT Short Term Goals - 01/12/16 1043    PT SHORT TERM GOAL #2   Title The patient will report a 30% improvement in pain with usual ADLs   Time 4   Period Weeks   Status On-going  No change           PT Long Term Goals - 01/06/16 1006  PT LONG TERM GOAL #1   Title The patient will be independent in safe self progression of HEP  02/24/16   Time 8   Period Weeks   Status On-going   PT LONG TERM GOAL #2   Title The patient will have improved cervical rotation to the right to 40 degrees needed for backing her car into the garage   Time 8   Period Weeks   Status On-going   PT LONG TERM GOAL #3   Title The patient will have improved deep cervical flexor, extensor and scapulothoracic muscle strength to grossly 4+/5 needed for standing, walking, sitting longer periods of time before postural fatigue   Time 8   Period Weeks   Status On-going   PT LONG TERM GOAL #4   Title Overall pain improved by 60% with usual ADLs like housework and driving   Time 8   Period Weeks   Status On-going   PT LONG TERM GOAL #5   Title FOTO functional outcome score improved from 50% limitation to 38% indicating improved function with less pain   Time 8   Period Weeks   Status On-going                Plan - 01/12/16 1045    Clinical Impression Statement Continues to complain of pain with stabbing pain when she turns head to the right. This seemed to calm slightly after performing self release on foam roll. Encouraged pt to return to MD if her pain remans unchanged. Cervical muscles continue to have copious amounts of trigger points.     Rehab Potential Good   Clinical Impairments Affecting Rehab Potential rheumatoid arthritis-no cervical retractions   PT Frequency 2x / week   PT Duration 8 weeks   PT Treatment/Interventions ADLs/Self Care Home Management;Cryotherapy;Electrical Stimulation;Moist Heat;Therapeutic exercise;Ultrasound;Patient/family education;Manual techniques;Taping;Dry needling   PT Next Visit Plan See how pt did after todays treatment pain wise.   Consulted and Agree with Plan of Care --      Patient will benefit from skilled therapeutic intervention in order to improve the following deficits and impairments:  Pain, Decreased strength, Increased muscle spasms, Decreased range of motion  Visit Diagnosis: Muscle weakness (generalized)  Chronic post-traumatic headache, not intractable  Cervicalgia     Problem List Patient Active Problem List   Diagnosis Date Noted  . Neck pain on right side 12/28/2015  . Postconcussion syndrome 10/25/2015  . Aortic arch atherosclerosis ([place]) 03/08/2015  . Hypertension 01/13/2015  . Chest pain 01/13/2015  . Labile hypertension 01/13/2015  . Rheumatoid arthritis ([place]) 08/29/2013  . ABDOMINAL PAIN, EPIGASTRIC 08/02/2009  . GERD 07/30/2009  . CONSTIPATION 07/30/2009  . IRRITABLE BOWEL SYNDROME 07/30/2009  . NAUSEA AND VOMITING 07/30/2009  . Nausea without vomiting 07/30/2009  . ABDOMINAL PAIN, GENERALIZED 07/30/2009    Sanam Marmo, PTA 01/12/2016, 11:04 AM   [facility] [address], [place], [place], [address] Phone: [phone]   Fax:   [phone]  Name: Patricia Long MRN: [id number] Date of Birth: 08-Nov-1950

## 2016-01-17 ENCOUNTER — Encounter: Payer: Self-pay | Admitting: Physical Therapy

## 2016-01-17 ENCOUNTER — Ambulatory Visit: Payer: No Typology Code available for payment source | Admitting: Physical Therapy

## 2016-01-17 DIAGNOSIS — M6281 Muscle weakness (generalized): Secondary | ICD-10-CM

## 2016-01-17 DIAGNOSIS — M542 Cervicalgia: Secondary | ICD-10-CM | POA: Diagnosis not present

## 2016-01-17 DIAGNOSIS — G44329 Chronic post-traumatic headache, not intractable: Secondary | ICD-10-CM | POA: Diagnosis not present

## 2016-01-17 NOTE — Therapy (Signed)
[facility] [address] [place], [place], [address] Phone: [phone]   Fax:  [phone]  Physical Therapy Treatment  Patient Details  Name: GAYANE TUGMAN MRN: [id number] Date of Birth: [birth year] Referring Provider: Dr. Delice Lesch  Encounter Date: 01/17/2016      PT End of Session - 01/17/16 1104    Visit Number 5   Number of Visits 10   Date for PT Re-Evaluation 02/24/16   Authorization Type Medicare G codes at 22;  KX at 6   PT Start Time 1101   PT Stop Time 1146   PT Time Calculation (min) 45 min   Activity Tolerance Patient tolerated treatment well   Behavior During Therapy [facility] for tasks assessed/performed      Past Medical History  Diagnosis Date  . Endometriosis   . Fibroid   . Irritable bowel syndrome   . Basal cell carcinoma ~ 2013    "legs"  . PONV (postoperative nausea and vomiting)   . Pneumonia 1970's X 1; ~ 2013  . Chronic bronchitis ([place])     "I've had it several times but not q yr" (01/13/2015)  . Rheumatoid arthritis(714.0)     Past Surgical History  Procedure Laterality Date  . Total abdominal hysterectomy  1997    TAH/BSO  . Pelvic laparoscopy  '82 AND '87  . Foot surgery Bilateral ~ 2006-2008    "reconstruction"  . Joint replacement Left 2009    WRIST AND FINGER   . Knee arthroscopy Right 1980's  . Breast cyst excision Bilateral 1992    NODULE EXCISION OF RIGHT AND LEFT BREAST  . Basal cell carcinoma excision  ~ 2013    "legs"    There were no vitals filed for this visit.      Subjective Assessment - 01/17/16 1105    Subjective I can turn to the right some better now. I really liked the foam roll exercises. Overall pain is less.    Currently in Pain? Yes   Pain Score 3    Pain Location Neck   Pain Orientation Upper   Aggravating Factors  Still turning to right   Pain Relieving Factors Foam roll exercises helpful, TENS   Multiple Pain Sites No                          [facility] Adult PT Treatment/Exercise - 01/17/16 0001    Neck Exercises: Supine   Other Supine Exercise MELT cervical release series with soft foam roll   Moist Heat Therapy   Number Minutes Moist Heat 15 Minutes   Moist Heat Location Cervical   Electrical Stimulation   Electrical Stimulation Location Scapula to cervical    Electrical Stimulation Action IFC   Electrical Stimulation Parameters 80-150HZ    Electrical Stimulation Goals Pain   Manual Therapy   Manual Therapy Soft tissue mobilization;Myofascial release   Soft tissue mobilization RT side of cervical muscles  SCM, scalenes, subocciciptals                  PT Short Term Goals - 01/17/16 1137    PT SHORT TERM GOAL #2   Title The patient will report a 30% improvement in pain with usual ADLs   Time 4   Period Weeks   Status Achieved   PT SHORT TERM GOAL #4   Title The patient will have improved cervical flexion to 55 degrees needed for reading   Time 4  Period Weeks   Status On-going  will measure end of week.           PT Long Term Goals - 01/06/16 1006    PT LONG TERM GOAL #1   Title The patient will be independent in safe self progression of HEP  02/24/16   Time 8   Period Weeks   Status On-going   PT LONG TERM GOAL #2   Title The patient will have improved cervical rotation to the right to 40 degrees needed for backing her car into the garage   Time 8   Period Weeks   Status On-going   PT LONG TERM GOAL #3   Title The patient will have improved deep cervical flexor, extensor and scapulothoracic muscle strength to grossly 4+/5 needed for standing, walking, sitting longer periods of time before postural fatigue   Time 8   Period Weeks   Status On-going   PT LONG TERM GOAL #4   Title Overall pain improved by 60% with usual ADLs like housework and driving   Time 8   Period Weeks   Status On-going   PT LONG TERM GOAL #5   Title FOTO functional outcome score  improved from 50% limitation to 38% indicating improved function with less pain   Time 8   Period Weeks   Status On-going               Plan - 01/17/16 1131    Clinical Impression Statement Pt reports that she can now turn her head to the right slightly more than last week with the pain also lessening. Her trigger points were only deep today and less copious. She reports the foam roll seems to help "get rid of them."   Rehab Potential Good   Clinical Impairments Affecting Rehab Potential rheumatoid arthritis-no cervical retractions   PT Frequency 2x / week   PT Duration 8 weeks   PT Treatment/Interventions ADLs/Self Care Home Management;Cryotherapy;Electrical Stimulation;Moist Heat;Therapeutic exercise;Ultrasound;Patient/family education;Manual techniques;Taping;Dry needling   PT Next Visit Plan Begin scapular postural strength in supine progresing to sitting with head in neutral.       Patient will benefit from skilled therapeutic intervention in order to improve the following deficits and impairments:  Pain, Decreased strength, Increased muscle spasms, Decreased range of motion  Visit Diagnosis: Muscle weakness (generalized)  Cervicalgia     Problem List Patient Active Problem List   Diagnosis Date Noted  . Neck pain on right side 12/28/2015  . Postconcussion syndrome 10/25/2015  . Aortic arch atherosclerosis ([place]) 03/08/2015  . Hypertension 01/13/2015  . Chest pain 01/13/2015  . Labile hypertension 01/13/2015  . Rheumatoid arthritis ([place]) 08/29/2013  . ABDOMINAL PAIN, EPIGASTRIC 08/02/2009  . GERD 07/30/2009  . CONSTIPATION 07/30/2009  . IRRITABLE BOWEL SYNDROME 07/30/2009  . NAUSEA AND VOMITING 07/30/2009  . Nausea without vomiting 07/30/2009  . ABDOMINAL PAIN, GENERALIZED 07/30/2009    COCHRAN,JENNIFER, PTA 01/17/2016, 11:40 AM  [facility] [address] [place], [place], [address] Phone:  [phone]   Fax:  [phone]  Name: LOURENA LICHT MRN: [id number] Date of Birth: 1951/03/14

## 2016-01-19 ENCOUNTER — Encounter: Payer: Self-pay | Admitting: Physical Therapy

## 2016-01-19 ENCOUNTER — Ambulatory Visit: Payer: No Typology Code available for payment source | Admitting: Physical Therapy

## 2016-01-19 DIAGNOSIS — G44329 Chronic post-traumatic headache, not intractable: Secondary | ICD-10-CM

## 2016-01-19 DIAGNOSIS — M6281 Muscle weakness (generalized): Secondary | ICD-10-CM | POA: Diagnosis not present

## 2016-01-19 DIAGNOSIS — M542 Cervicalgia: Secondary | ICD-10-CM | POA: Diagnosis not present

## 2016-01-19 NOTE — Patient Instructions (Signed)
Cervical Stabilization Exercises:   1: Lay on your back with knees bent comfortably. Small pillow under head is ok.    -Gently press the back of the head into the pillow, hold 3 seconds, do 5x  - Gentle head press an hold, then press shoulders back feeling stretch across chest. Hold 3 sec, do 5x  - Gentle head press hold: open & close the door 5-8x  - Gentle head press hold: scissor arms, alternating overhead 6x each way.

## 2016-01-19 NOTE — Therapy (Signed)
[facility] [address], [place], [place], [address] Phone: [phone]   Fax:  [phone]  Physical Therapy Treatment  Patient Details  Name: Patricia Long MRN: [id number] Date of Birth: 21-Apr-1951 Referring Provider: Dr. Delice Lesch  Encounter Date: 01/19/2016      PT End of Session - 01/19/16 1148    Visit Number 6   Number of Visits 10   Date for PT Re-Evaluation 02/24/16   Authorization Type Medicare G codes at 49;  KX at 68   PT Start Time 1146   PT Stop Time 1240   PT Time Calculation (min) 54 min   Activity Tolerance Patient tolerated treatment well   Behavior During Therapy [facility] for tasks assessed/performed      Past Medical History  Diagnosis Date  . Endometriosis   . Fibroid   . Irritable bowel syndrome   . Basal cell carcinoma ~ 2013    "legs"  . PONV (postoperative nausea and vomiting)   . Pneumonia 1970's X 1; ~ 2013  . Chronic bronchitis ([place])     "I've had it several times but not q yr" (01/13/2015)  . Rheumatoid arthritis(714.0)     Past Surgical History  Procedure Laterality Date  . Total abdominal hysterectomy  1997    TAH/BSO  . Pelvic laparoscopy  '82 AND '87  . Foot surgery Bilateral ~ 2006-2008    "reconstruction"  . Joint replacement Left 2009    WRIST AND FINGER   . Knee arthroscopy Right 1980's  . Breast cyst excision Bilateral 1992    NODULE EXCISION OF RIGHT AND LEFT BREAST  . Basal cell carcinoma excision  ~ 2013    "legs"    There were no vitals filed for this visit.      Subjective Assessment - 01/19/16 1147    Subjective Yesterday was not a good day pain wise, but today is much better. Still feels like rotates Rt better.   Currently in Pain? No/denies   Multiple Pain Sites No            [facility] PT Assessment - 01/19/16 0001    AROM   Cervical - Right Rotation 65                     [facility] Adult PT Treatment/Exercise - 01/19/16 0001    Neck Exercises: Supine   Other Supine Exercise Gentle head press 5x, then L 1 cervical stabs 10 x each   Other Supine Exercise MELt release with foam roll.    Shoulder Exercises: Seated   Horizontal ABduction Strengthening;Both;Theraband;20 reps   Theraband Level (Shoulder Horizontal ABduction) Level 1 (Yellow)   Shoulder Exercises: ROM/Strengthening   UBE (Upper Arm Bike) L2 4 min reverse  Lumbar support used   Moist Heat Therapy   Number Minutes Moist Heat 15 Minutes   Moist Heat Location Cervical   Electrical Stimulation   Electrical Stimulation Location Scapula to cervical    Electrical Stimulation Action IFC   Electrical Stimulation Parameters 80-150 HZ   Electrical Stimulation Goals Pain                PT Education - 01/19/16 1154    Education provided Yes   Education Details Cervical Stabs L1   Person(s) Educated Patient   Methods Explanation;Demonstration;Tactile cues;Verbal cues;Handout   Comprehension Verbalized understanding;Returned demonstration          PT Short Term Goals - 01/17/16 1137    PT  SHORT TERM GOAL #2   Title The patient will report a 30% improvement in pain with usual ADLs   Time 4   Period Weeks   Status Achieved   PT SHORT TERM GOAL #4   Title The patient will have improved cervical flexion to 55 degrees needed for reading   Time 4   Period Weeks   Status On-going  will measure end of week.           PT Long Term Goals - 01/06/16 1006    PT LONG TERM GOAL #1   Title The patient will be independent in safe self progression of HEP  02/24/16   Time 8   Period Weeks   Status On-going   PT LONG TERM GOAL #2   Title The patient will have improved cervical rotation to the right to 40 degrees needed for backing her car into the garage   Time 8   Period Weeks   Status On-going   PT LONG TERM GOAL #3   Title The patient will have improved deep cervical flexor, extensor and scapulothoracic muscle strength to grossly 4+/5 needed for  standing, walking, sitting longer periods of time before postural fatigue   Time 8   Period Weeks   Status On-going   PT LONG TERM GOAL #4   Title Overall pain improved by 60% with usual ADLs like housework and driving   Time 8   Period Weeks   Status On-going   PT LONG TERM GOAL #5   Title FOTO functional outcome score improved from 50% limitation to 38% indicating improved function with less pain   Time 8   Period Weeks   Status On-going               Plan - 01/19/16 1149    Clinical Impression Statement Pt rotating head to the right over 60 degrees without stabbing pain. Pt tolerated 30 minutes of exercise today.    Rehab Potential Good   Clinical Impairments Affecting Rehab Potential rheumatoid arthritis-no cervical retractions   PT Frequency 2x / week   PT Duration 8 weeks   PT Treatment/Interventions ADLs/Self Care Home Management;Cryotherapy;Electrical Stimulation;Moist Heat;Therapeutic exercise;Ultrasound;Patient/family education;Manual techniques;Taping;Dry needling   PT Next Visit Plan See hoe pt did with all the exercise   Consulted and Agree with Plan of Care Patient      Patient will benefit from skilled therapeutic intervention in order to improve the following deficits and impairments:  Pain, Decreased strength, Increased muscle spasms, Decreased range of motion  Visit Diagnosis: Muscle weakness (generalized)  Cervicalgia  Chronic post-traumatic headache, not intractable     Problem List Patient Active Problem List   Diagnosis Date Noted  . Neck pain on right side 12/28/2015  . Postconcussion syndrome 10/25/2015  . Aortic arch atherosclerosis ([place]) 03/08/2015  . Hypertension 01/13/2015  . Chest pain 01/13/2015  . Labile hypertension 01/13/2015  . Rheumatoid arthritis ([place]) 08/29/2013  . ABDOMINAL PAIN, EPIGASTRIC 08/02/2009  . GERD 07/30/2009  . CONSTIPATION 07/30/2009  . IRRITABLE BOWEL SYNDROME 07/30/2009  . NAUSEA AND VOMITING 07/30/2009   . Nausea without vomiting 07/30/2009  . ABDOMINAL PAIN, GENERALIZED 07/30/2009    Biridiana Twardowski, PTA 01/19/2016, 12:24 PM  [facility] [address], [place], [place], [address] Phone: [phone]   Fax:  [phone]  Name: Patricia Long MRN: [id number] Date of Birth: September 11, 1951

## 2016-01-24 ENCOUNTER — Ambulatory Visit: Payer: No Typology Code available for payment source | Attending: Neurology | Admitting: Physical Therapy

## 2016-01-24 ENCOUNTER — Encounter: Payer: Self-pay | Admitting: Physical Therapy

## 2016-01-24 DIAGNOSIS — M6281 Muscle weakness (generalized): Secondary | ICD-10-CM | POA: Diagnosis not present

## 2016-01-24 DIAGNOSIS — G44329 Chronic post-traumatic headache, not intractable: Secondary | ICD-10-CM | POA: Insufficient documentation

## 2016-01-24 DIAGNOSIS — M542 Cervicalgia: Secondary | ICD-10-CM | POA: Diagnosis not present

## 2016-01-24 NOTE — Therapy (Signed)
[facility] [address] [place], [place], [address] Phone: [phone]   Fax:  [phone]  Physical Therapy Treatment  Patient Details  Name: Patricia Long MRN: [id number] Date of Birth: 01-26-1951 Referring Provider: Dr. Delice Lesch  Encounter Date: 01/24/2016      PT End of Session - 01/24/16 1107    Visit Number 7   Number of Visits 10   Date for PT Re-Evaluation 02/24/16   Authorization Type Medicare G codes at 66;  KX at 51   PT Start Time 1105   PT Stop Time 1205   PT Time Calculation (min) 60 min   Activity Tolerance Patient tolerated treatment well   Behavior During Therapy [facility] for tasks assessed/performed      Past Medical History  Diagnosis Date  . Endometriosis   . Fibroid   . Irritable bowel syndrome   . Basal cell carcinoma ~ 2013    "legs"  . PONV (postoperative nausea and vomiting)   . Pneumonia 1970's X 1; ~ 2013  . Chronic bronchitis ([place])     "I've had it several times but not q yr" (01/13/2015)  . Rheumatoid arthritis(714.0)     Past Surgical History  Procedure Laterality Date  . Total abdominal hysterectomy  1997    TAH/BSO  . Pelvic laparoscopy  '82 AND '87  . Foot surgery Bilateral ~ 2006-2008    "reconstruction"  . Joint replacement Left 2009    WRIST AND FINGER   . Knee arthroscopy Right 1980's  . Breast cyst excision Bilateral 1992    NODULE EXCISION OF RIGHT AND LEFT BREAST  . Basal cell carcinoma excision  ~ 2013    "legs"    There were no vitals filed for this visit.      Subjective Assessment - 01/24/16 1108    Subjective Had a good weekend, pulled some weeds. No pain with movement of her head. Pain with movement: looking down and turning right. Still not as bad as it was.    Currently in Pain? --  See above   Pain Score --  0 at rest, with movement 5/10   Pain Location Neck  with intermittent radiation to scapula   Pain Orientation Right   Pain  Descriptors / Indicators Throbbing   Aggravating Factors  still turning to right and looking down   Pain Relieving Factors Exercises, TENS, not moving                          [facility] Adult PT Treatment/Exercise - 01/24/16 0001    Neck Exercises: Supine   Other Supine Exercise Gentle head press 5x, then L 1 cervical stabs 10 x each   Other Supine Exercise MELt release with foam roll.    Shoulder Exercises: Supine   Horizontal ABduction Strengthening;20 reps;Theraband   Theraband Level (Shoulder Horizontal ABduction) Level 2 (Red)   Flexion --  D2 flexion red band 10 x each side   Shoulder Exercises: ROM/Strengthening   UBE (Upper Arm Bike) L1 3x 3    Moist Heat Therapy   Number Minutes Moist Heat 15 Minutes   Moist Heat Location Cervical   Electrical Stimulation   Electrical Stimulation Location Scapula to cervical    Electrical Stimulation Action IFC   Electrical Stimulation Parameters 80-150 HZ   Electrical Stimulation Goals Pain   Manual Therapy   Manual Therapy Soft tissue mobilization;Myofascial release   Soft tissue mobilization RT side  of cervical muscles  SCM, scalenes, subocciciptals                  PT Short Term Goals - 01/17/16 1137    PT SHORT TERM GOAL #2   Title The patient will report a 30% improvement in pain with usual ADLs   Time 4   Period Weeks   Status Achieved   PT SHORT TERM GOAL #4   Title The patient will have improved cervical flexion to 55 degrees needed for reading   Time 4   Period Weeks   Status On-going  will measure end of week.           PT Long Term Goals - 01/24/16 1114    PT LONG TERM GOAL #2   Title The patient will have improved cervical rotation to the right to 40 degrees needed for backing her car into the garage   Time 8   Period Weeks   Status Achieved  She has the ROM but it can still be bothersome/painful               Plan - 01/24/16 1148    Clinical Impression Statement Pt  reports still about the same as lst week: still better but still some pain turning and looking down. Pt has been able to maintain her neck ROM. Pt was able to perform scapular exercises with red band today. Pt indentpendent with her cervical stabs for home.    Rehab Potential Good   Clinical Impairments Affecting Rehab Potential rheumatoid arthritis-no cervical retractions   PT Frequency 2x / week   PT Duration 8 weeks   PT Treatment/Interventions ADLs/Self Care Home Management;Cryotherapy;Electrical Stimulation;Moist Heat;Therapeutic exercise;Ultrasound;Patient/family education;Manual techniques;Taping;Dry needling   PT Next Visit Plan Red band for HEP advancement   Consulted and Agree with Plan of Care Patient      Patient will benefit from skilled therapeutic intervention in order to improve the following deficits and impairments:  Pain, Decreased strength, Increased muscle spasms, Decreased range of motion  Visit Diagnosis: Muscle weakness (generalized)  Cervicalgia  Chronic post-traumatic headache, not intractable     Problem List Patient Active Problem List   Diagnosis Date Noted  . Neck pain on right side 12/28/2015  . Postconcussion syndrome 10/25/2015  . Aortic arch atherosclerosis ([place]) 03/08/2015  . Hypertension 01/13/2015  . Chest pain 01/13/2015  . Labile hypertension 01/13/2015  . Rheumatoid arthritis ([place]) 08/29/2013  . ABDOMINAL PAIN, EPIGASTRIC 08/02/2009  . GERD 07/30/2009  . CONSTIPATION 07/30/2009  . IRRITABLE BOWEL SYNDROME 07/30/2009  . NAUSEA AND VOMITING 07/30/2009  . Nausea without vomiting 07/30/2009  . ABDOMINAL PAIN, GENERALIZED 07/30/2009    Vikram Tillett, PTA 01/24/2016, 11:51 AM  [facility] [address], [place], [place], [address] Phone: [phone]   Fax:  [phone]  Name: Patricia Long MRN: [id number] Date of Birth: [birth year]

## 2016-01-26 ENCOUNTER — Ambulatory Visit: Payer: No Typology Code available for payment source | Admitting: Physical Therapy

## 2016-01-26 ENCOUNTER — Encounter: Payer: Self-pay | Admitting: Physical Therapy

## 2016-01-26 DIAGNOSIS — G44329 Chronic post-traumatic headache, not intractable: Secondary | ICD-10-CM

## 2016-01-26 DIAGNOSIS — M6281 Muscle weakness (generalized): Secondary | ICD-10-CM | POA: Diagnosis not present

## 2016-01-26 DIAGNOSIS — M542 Cervicalgia: Secondary | ICD-10-CM | POA: Diagnosis not present

## 2016-01-26 NOTE — Therapy (Signed)
[facility] [address], [place], [place], [address] Phone: [phone]   Fax:  [phone]  Physical Therapy Treatment  Patient Details  Name: Patricia Long MRN: [id number] Date of Birth: 1950-11-01 Referring Provider: Dr. Delice Lesch  Encounter Date: 01/26/2016      PT End of Session - 01/26/16 1228    Visit Number 8   Number of Visits 10   Date for PT Re-Evaluation 02/24/16   Authorization Type Medicare G codes at 65;  KX at 51   PT Start Time 1228   PT Stop Time 1316   PT Time Calculation (min) 48 min   Activity Tolerance Patient tolerated treatment well   Behavior During Therapy [facility] for tasks assessed/performed      Past Medical History  Diagnosis Date  . Endometriosis   . Fibroid   . Irritable bowel syndrome   . Basal cell carcinoma ~ 2013    "legs"  . PONV (postoperative nausea and vomiting)   . Pneumonia 1970's X 1; ~ 2013  . Chronic bronchitis ([place])     "I've had it several times but not q yr" (01/13/2015)  . Rheumatoid arthritis(714.0)     Past Surgical History  Procedure Laterality Date  . Total abdominal hysterectomy  1997    TAH/BSO  . Pelvic laparoscopy  '82 AND '87  . Foot surgery Bilateral ~ 2006-2008    "reconstruction"  . Joint replacement Left 2009    WRIST AND FINGER   . Knee arthroscopy Right 1980's  . Breast cyst excision Bilateral 1992    NODULE EXCISION OF RIGHT AND LEFT BREAST  . Basal cell carcinoma excision  ~ 2013    "legs"    There were no vitals filed for this visit.      Subjective Assessment - 01/26/16 1231    Subjective Felt good after last session.    Currently in Pain? --  No pain unless I stretch it.    Aggravating Factors  Stretching it   Pain Relieving Factors Therapy   Multiple Pain Sites No                         [facility] Adult PT Treatment/Exercise - 01/26/16 0001    Neck Exercises: Supine   Other Supine Exercise MELt release  with foam roll.    Shoulder Exercises: Supine   Horizontal ABduction Strengthening;20 reps;Theraband   Theraband Level (Shoulder Horizontal ABduction) Level 2 (Red)  Including diagonols bil 2 x10 all done laying on foam roll   Shoulder Exercises: ROM/Strengthening   UBE (Upper Arm Bike) L1 3x 3    Manual Therapy   Manual Therapy Soft tissue mobilization;Myofascial release  Static stretches   Soft tissue mobilization RT side of cervical muscles  SCM, scalenes, subocciciptals                  PT Short Term Goals - 01/17/16 1137    PT SHORT TERM GOAL #2   Title The patient will report a 30% improvement in pain with usual ADLs   Time 4   Period Weeks   Status Achieved   PT SHORT TERM GOAL #4   Title The patient will have improved cervical flexion to 55 degrees needed for reading   Time 4   Period Weeks   Status On-going  will measure end of week.           PT Long Term Goals - 01/24/16  [place] #2   Title The patient will have improved cervical rotation to the right to 40 degrees needed for backing her car into the garage   Time 8   Period Weeks   Status Achieved  She has the ROM but it can still be bothersome/painful               Plan - 01/26/16 1236    Clinical Impression Statement (p) pt feels as though she continues to improve with her turning and pain lessening. Increased band work at home to red band and performed all exercises on th esoft foam roll. Spent some more time on soft tissue work and stretching the cervical muscles mainly RT.    Rehab Potential Good   Clinical Impairments Affecting Rehab Potential rheumatoid arthritis-no cervical retractions   PT Frequency 2x / week   PT Duration 8 weeks   PT Treatment/Interventions ADLs/Self Care Home Management;Cryotherapy;Electrical Stimulation;Moist Heat;Therapeutic exercise;Ultrasound;Patient/family education;Manual techniques;Taping;Dry needling   Consulted and Agree with Plan of  Care Patient      Patient will benefit from skilled therapeutic intervention in order to improve the following deficits and impairments:  Pain, Decreased strength, Increased muscle spasms, Decreased range of motion  Visit Diagnosis: Muscle weakness (generalized)  Cervicalgia  Chronic post-traumatic headache, not intractable     Problem List Patient Active Problem List   Diagnosis Date Noted  . Neck pain on right side 12/28/2015  . Postconcussion syndrome 10/25/2015  . Aortic arch atherosclerosis ([place]) 03/08/2015  . Hypertension 01/13/2015  . Chest pain 01/13/2015  . Labile hypertension 01/13/2015  . Rheumatoid arthritis ([place]) 08/29/2013  . ABDOMINAL PAIN, EPIGASTRIC 08/02/2009  . GERD 07/30/2009  . CONSTIPATION 07/30/2009  . IRRITABLE BOWEL SYNDROME 07/30/2009  . NAUSEA AND VOMITING 07/30/2009  . Nausea without vomiting 07/30/2009  . ABDOMINAL PAIN, GENERALIZED 07/30/2009    Summerlynn Glauser, PTA 01/26/2016, 1:17 PM  [facility] [address] [place], [place], [address] Phone: [phone]   Fax:  [phone]  Name: BARNEY CLACK MRN: [id number] Date of Birth: 1950-10-22

## 2016-01-31 ENCOUNTER — Encounter: Payer: Self-pay | Admitting: Physical Therapy

## 2016-01-31 ENCOUNTER — Ambulatory Visit: Payer: No Typology Code available for payment source | Admitting: Physical Therapy

## 2016-01-31 DIAGNOSIS — M6281 Muscle weakness (generalized): Secondary | ICD-10-CM | POA: Diagnosis not present

## 2016-01-31 DIAGNOSIS — G44329 Chronic post-traumatic headache, not intractable: Secondary | ICD-10-CM | POA: Diagnosis not present

## 2016-01-31 DIAGNOSIS — M542 Cervicalgia: Secondary | ICD-10-CM

## 2016-01-31 NOTE — Therapy (Signed)
[facility] [address], [place], [place], [address] Phone: [phone]   Fax:  [phone]  Physical Therapy Treatment  Patient Details  Name: Patricia Long MRN: [id number] Date of Birth: August 14, 1951 Referring Provider: Dr. Delice Lesch  Encounter Date: 01/31/2016      PT End of Session - 01/31/16 1102    Visit Number 9   Number of Visits 10   Date for PT Re-Evaluation 02/24/16   Authorization Type Medicare G codes at 17;  KX at 69   PT Start Time 1058   PT Stop Time 1145   PT Time Calculation (min) 47 min   Activity Tolerance Patient tolerated treatment well   Behavior During Therapy [facility] for tasks assessed/performed      Past Medical History  Diagnosis Date  . Endometriosis   . Fibroid   . Irritable bowel syndrome   . Basal cell carcinoma ~ 2013    "legs"  . PONV (postoperative nausea and vomiting)   . Pneumonia 1970's X 1; ~ 2013  . Chronic bronchitis ([place])     "I've had it several times but not q yr" (01/13/2015)  . Rheumatoid arthritis(714.0)     Past Surgical History  Procedure Laterality Date  . Total abdominal hysterectomy  1997    TAH/BSO  . Pelvic laparoscopy  '82 AND '87  . Foot surgery Bilateral ~ 2006-2008    "reconstruction"  . Joint replacement Left 2009    WRIST AND FINGER   . Knee arthroscopy Right 1980's  . Breast cyst excision Bilateral 1992    NODULE EXCISION OF RIGHT AND LEFT BREAST  . Basal cell carcinoma excision  ~ 2013    "legs"    There were no vitals filed for this visit.      Subjective Assessment - 01/31/16 1109    Subjective Felt good after again after session, then had some more pain over weekend. Doing exercises consistently. RT shoulder blade tends to be area most bothersome.    Currently in Pain? No/denies   Multiple Pain Sites No            [facility] PT Assessment - 01/31/16 0001    AROM   Cervical - Right Rotation 60 post treatment                      [facility] Adult PT Treatment/Exercise - 01/31/16 0001    Neck Exercises: Supine   Other Supine Exercise MELt release with foam roll.    Shoulder Exercises: Supine   Horizontal ABduction Strengthening;Both  2 x15 red band   Theraband Level (Shoulder Horizontal ABduction) Level 2 (Red)   Other Supine Exercises RT scapular release with foam roll:   Pt is going to try with tennis ball at home.   Shoulder Exercises: Seated   Other Seated Exercises Rows in sitting 30# 3 x10   Shoulder Exercises: ROM/Strengthening   UBE (Upper Arm Bike) L2 3x3   Manual Therapy   Manual Therapy Soft tissue mobilization;Myofascial release   Soft tissue mobilization RT side of cervical muscles                  PT Short Term Goals - 01/31/16 1147    PT SHORT TERM GOAL #4   Title The patient will have improved cervical flexion to 55 degrees needed for reading   Time 4   Period Weeks   Status On-going  55 today  PT Long Term Goals - 01/24/16 1114    PT LONG TERM GOAL #2   Title The patient will have improved cervical rotation to the right to 40 degrees needed for backing her car into the garage   Time 8   Period Weeks   Status Achieved  She has the ROM but it can still be bothersome/painful               Plan - 01/31/16 1103    Clinical Impression Statement pt has maintained her AROM with Rt rotation and performed today with only reported pulling, no pain. She continues with trigger points along medial scapular border which she is going to try using a tennis ball at home. Presented  with no pain.    Rehab Potential Good   Clinical Impairments Affecting Rehab Potential rheumatoid arthritis-no cervical retractions   PT Frequency 2x / week   PT Treatment/Interventions ADLs/Self Care Home Management;Cryotherapy;Electrical Stimulation;Moist Heat;Therapeutic exercise;Ultrasound;Patient/family education;Manual techniques;Taping;Dry needling   PT Next  Visit Plan Scapular stabilization for HEP.   Consulted and Agree with Plan of Care Patient      Patient will benefit from skilled therapeutic intervention in order to improve the following deficits and impairments:  Pain, Decreased strength, Increased muscle spasms, Decreased range of motion  Visit Diagnosis: Muscle weakness (generalized)  Cervicalgia  Chronic post-traumatic headache, not intractable     Problem List Patient Active Problem List   Diagnosis Date Noted  . Neck pain on right side 12/28/2015  . Postconcussion syndrome 10/25/2015  . Aortic arch atherosclerosis ([place]) 03/08/2015  . Hypertension 01/13/2015  . Chest pain 01/13/2015  . Labile hypertension 01/13/2015  . Rheumatoid arthritis ([place]) 08/29/2013  . ABDOMINAL PAIN, EPIGASTRIC 08/02/2009  . GERD 07/30/2009  . CONSTIPATION 07/30/2009  . IRRITABLE BOWEL SYNDROME 07/30/2009  . NAUSEA AND VOMITING 07/30/2009  . Nausea without vomiting 07/30/2009  . ABDOMINAL PAIN, GENERALIZED 07/30/2009    COCHRAN,JENNIFER, PTA 01/31/2016, 11:49 AM  [facility] [address], [place], [place], [address] Phone: [phone]   Fax:  [phone]  Name: Patricia Long MRN: [id number] Date of Birth: [birth year]

## 2016-02-02 ENCOUNTER — Ambulatory Visit: Payer: No Typology Code available for payment source

## 2016-02-02 DIAGNOSIS — M542 Cervicalgia: Secondary | ICD-10-CM | POA: Diagnosis not present

## 2016-02-02 DIAGNOSIS — M6281 Muscle weakness (generalized): Secondary | ICD-10-CM

## 2016-02-02 DIAGNOSIS — G44329 Chronic post-traumatic headache, not intractable: Secondary | ICD-10-CM

## 2016-02-02 NOTE — Therapy (Signed)
[facility] [address] [place], [place], [address] Phone: [phone]   Fax:  [phone]  Physical Therapy Treatment  Patient Details  Name: Patricia Long MRN: [id number] Date of Birth: Jul 22, 1951 Referring Provider: Dr. Delice Lesch  Encounter Date: 02/02/2016      PT End of Session - 02/02/16 1144    Visit Number 10   Number of Visits 20   Date for PT Re-Evaluation 02/24/16   Authorization Type Medicare G codes at 77;  KX at 66   PT Start Time 1101   PT Stop Time 1155   PT Time Calculation (min) 54 min   Activity Tolerance Patient tolerated treatment well   Behavior During Therapy [facility] for tasks assessed/performed      Past Medical History  Diagnosis Date  . Endometriosis   . Fibroid   . Irritable bowel syndrome   . Basal cell carcinoma ~ 2013    "legs"  . PONV (postoperative nausea and vomiting)   . Pneumonia 1970's X 1; ~ 2013  . Chronic bronchitis ([place])     "I've had it several times but not q yr" (01/13/2015)  . Rheumatoid arthritis(714.0)     Past Surgical History  Procedure Laterality Date  . Total abdominal hysterectomy  1997    TAH/BSO  . Pelvic laparoscopy  '82 AND '87  . Foot surgery Bilateral ~ 2006-2008    "reconstruction"  . Joint replacement Left 2009    WRIST AND FINGER   . Knee arthroscopy Right 1980's  . Breast cyst excision Bilateral 1992    NODULE EXCISION OF RIGHT AND LEFT BREAST  . Basal cell carcinoma excision  ~ 2013    "legs"    There were no vitals filed for this visit.      Subjective Assessment - 02/02/16 1109    Subjective feeling much better.  30% overall improvement.     Currently in Pain? No/denies   Pain Score --  up to 6/10 pain with turning head (Rt>Lt)            [facility] PT Assessment - 02/02/16 0001    Assessment   Medical Diagnosis neck pain on right   Observation/Other Assessments   Focus on Therapeutic Outcomes (FOTO)  49% limitation   AROM   Cervical Flexion 65   Cervical Extension 45   Cervical - Right Rotation 50   Cervical - Left Rotation 60                     [facility] Adult PT Treatment/Exercise - 02/02/16 0001    Neck Exercises: Supine   Other Supine Exercise MELt release with foam roll.    Shoulder Exercises: Seated   Horizontal ABduction Strengthening;Both;Theraband;20 reps   Theraband Level (Shoulder Horizontal ABduction) Level 2 (Red)   Other Seated Exercises --   Shoulder Exercises: Standing   Row Strengthening;Both;Weights   Row Weight (lbs) 35   Row Limitations 3x10   Other Standing Exercises D2 with red band 2x10 bil each.   Moist Heat Therapy   Number Minutes Moist Heat 10 Minutes   Moist Heat Location Cervical   Manual Therapy   Manual Therapy Soft tissue mobilization;Myofascial release   Soft tissue mobilization bil cervical paraspinals, UT and suboccipitals   Manual Traction very gentle 3x 30 sec                  PT Short Term Goals - 02/02/16 1110    PT  SHORT TERM GOAL #4   Title The patient will have improved cervical flexion to 55 degrees needed for reading   Time 4   Period Weeks   Status On-going  48           PT Long Term Goals - Feb 28, 2016 1111    PT LONG TERM GOAL #2   Title The patient will have improved cervical rotation to the right to 40 degrees needed for backing her car into the garage   Time 8   Period Weeks   Status On-going  50 degrees but not able to back in with pain at end range   PT LONG TERM GOAL #4   Title Overall pain improved by 60% with usual ADLs like housework and driving   Time 8   Period Weeks   Status On-going               Plan - 02-28-16 1119    Clinical Impression Statement Pt reports 30% overall improvement since the start of care.  Pt able to perform scapular stabilization in standing today.  Pt with improved postural awareness and endurance in good alignment.  Pt with limtied cervical AROM and pain with Rt  rotation needed to back into garage.  Pt with continued trigger point in Rt medial scapular border and will use ball for release at home.  Pt will continue to benefit from skilled PT for strength, manual and flexibility.     Rehab Potential Good   Clinical Impairments Affecting Rehab Potential rheumatoid arthritis-no cervical retractions   PT Frequency 2x / week   PT Duration 8 weeks   PT Treatment/Interventions ADLs/Self Care Home Management;Cryotherapy;Electrical Stimulation;Moist Heat;Therapeutic exercise;Ultrasound;Patient/family education;Manual techniques;Taping;Dry needling   PT Next Visit Plan continue postural strength, endurance, manual   Consulted and Agree with Plan of Care Patient      Patient will benefit from skilled therapeutic intervention in order to improve the following deficits and impairments:  Pain, Decreased strength, Increased muscle spasms, Decreased range of motion  Visit Diagnosis: Muscle weakness (generalized)  Cervicalgia  Chronic post-traumatic headache, not intractable       G-Codes - 02/28/16 1107    Functional Assessment Tool Used FOTO and clinical judgement   Functional Limitation Carrying, moving and handling objects   Carrying, Moving and Handling Objects Current Status [phone]) At least 40 percent but less than 60 percent impaired, limited or restricted   Carrying, Moving and Handling Objects Goal Status [id number]) At least 20 percent but less than 40 percent impaired, limited or restricted      Problem List Patient Active Problem List   Diagnosis Date Noted  . Neck pain on right side 12/28/2015  . Postconcussion syndrome 10/25/2015  . Aortic arch atherosclerosis ([place]) 03/08/2015  . Hypertension 01/13/2015  . Chest pain 01/13/2015  . Labile hypertension 01/13/2015  . Rheumatoid arthritis ([place]) 08/29/2013  . ABDOMINAL PAIN, EPIGASTRIC 08/02/2009  . GERD 07/30/2009  . CONSTIPATION 07/30/2009  . IRRITABLE BOWEL SYNDROME 07/30/2009  . NAUSEA  AND VOMITING 07/30/2009  . Nausea without vomiting 07/30/2009  . ABDOMINAL PAIN, GENERALIZED 07/30/2009    Sigurd Sos, PT February 28, 2016 11:45 AM  [facility] [address] [place], [place], [address] Phone: [phone]   Fax:  [phone]  Name: JAIMELYN PLEVA MRN: [id number] Date of Birth: Dec 11, 1950

## 2016-02-07 ENCOUNTER — Ambulatory Visit: Payer: No Typology Code available for payment source | Admitting: Physical Therapy

## 2016-02-07 ENCOUNTER — Encounter: Payer: Self-pay | Admitting: Physical Therapy

## 2016-02-07 DIAGNOSIS — G44329 Chronic post-traumatic headache, not intractable: Secondary | ICD-10-CM

## 2016-02-07 DIAGNOSIS — M542 Cervicalgia: Secondary | ICD-10-CM | POA: Diagnosis not present

## 2016-02-07 DIAGNOSIS — M6281 Muscle weakness (generalized): Secondary | ICD-10-CM | POA: Diagnosis not present

## 2016-02-07 NOTE — Therapy (Signed)
[facility] [address], [place], [place], [address] Phone: [phone]   Fax:  [phone]  Physical Therapy Treatment  Patient Details  Name: Patricia Long MRN: [id number] Date of Birth: 1951/09/03 Referring Provider: Dr. Delice Lesch  Encounter Date: 02/07/2016      PT End of Session - 02/07/16 1536    Visit Number 11   Number of Visits 20   Date for PT Re-Evaluation 02/24/16   Authorization Type Medicare G codes at 60;  KX at 83   PT Start Time 1526   PT Stop Time 1615   PT Time Calculation (min) 49 min   Activity Tolerance Patient tolerated treatment well   Behavior During Therapy [facility] for tasks assessed/performed      Past Medical History  Diagnosis Date  . Endometriosis   . Fibroid   . Irritable bowel syndrome   . Basal cell carcinoma ~ 2013    "legs"  . PONV (postoperative nausea and vomiting)   . Pneumonia 1970's X 1; ~ 2013  . Chronic bronchitis ([place])     "I've had it several times but not q yr" (01/13/2015)  . Rheumatoid arthritis(714.0)     Past Surgical History  Procedure Laterality Date  . Total abdominal hysterectomy  1997    TAH/BSO  . Pelvic laparoscopy  '82 AND '87  . Foot surgery Bilateral ~ 2006-2008    "reconstruction"  . Joint replacement Left 2009    WRIST AND FINGER   . Knee arthroscopy Right 1980's  . Breast cyst excision Bilateral 1992    NODULE EXCISION OF RIGHT AND LEFT BREAST  . Basal cell carcinoma excision  ~ 2013    "legs"    There were no vitals filed for this visit.      Subjective Assessment - 02/07/16 1541    Subjective 30% improvement   Currently in Pain? No/denies   Aggravating Factors  Turning head to RT   Pain Relieving Factors Therapy   Multiple Pain Sites No                         [facility] Adult PT Treatment/Exercise - 02/07/16 0001    Neck Exercises: Supine   Other Supine Exercise MELt release with foam roll.    Neck Exercises:  Prone   Other Prone Exercise Prone 'T" 2x10   PTA applied pressure to TP which helped reduce pain.    Other Prone Exercise prone "y" 2x10   Shoulder Exercises: Seated   Horizontal ABduction Strengthening;Both;20 reps;Theraband  2 sets of 10 including diagonals, sitting on blue ball   Theraband Level (Shoulder Horizontal ABduction) Level 2 (Red)   Shoulder Exercises: ROM/Strengthening   UBE (Upper Arm Bike) L2 3x3  Sittin gon blue ball   Manual Therapy   Manual Therapy --  prone Trigger Point work along RT thoracic in between prone                   PT Short Term Goals - 02/02/16 1110    PT SHORT TERM GOAL #4   Title The patient will have improved cervical flexion to 55 degrees needed for reading   Time 4   Period Weeks   Status On-going  48           PT Long Term Goals - 02/02/16 1111    PT LONG TERM GOAL #2   Title The patient will have improved cervical rotation to the  right to 40 degrees needed for backing her car into the garage   Time 8   Period Weeks   Status On-going  50 degrees but not able to back in with pain at end range   PT LONG TERM GOAL #4   Title Overall pain improved by 60% with usual ADLs like housework and driving   Time 8   Period Weeks   Status On-going               Plan - 02/07/16 1538    Clinical Impression Statement Continues to have pain along the scapular border into the lower traps. trigger point compression helped reduce pain during the prone exercises. pt still 30% improved, same as last week.    Rehab Potential Good   Clinical Impairments Affecting Rehab Potential rheumatoid arthritis-no cervical retractions   PT Frequency 2x / week   PT Duration 8 weeks   PT Treatment/Interventions ADLs/Self Care Home Management;Cryotherapy;Electrical Stimulation;Moist Heat;Therapeutic exercise;Ultrasound;Patient/family education;Manual techniques;Taping;Dry needling   PT Next Visit Plan prone strength, work on trigger points    Consulted and Agree with Plan of Care Patient      Patient will benefit from skilled therapeutic intervention in order to improve the following deficits and impairments:  Pain, Decreased strength, Increased muscle spasms, Decreased range of motion  Visit Diagnosis: Muscle weakness (generalized)  Cervicalgia  Chronic post-traumatic headache, not intractable     Problem List Patient Active Problem List   Diagnosis Date Noted  . Neck pain on right side 12/28/2015  . Postconcussion syndrome 10/25/2015  . Aortic arch atherosclerosis ([place]) 03/08/2015  . Hypertension 01/13/2015  . Chest pain 01/13/2015  . Labile hypertension 01/13/2015  . Rheumatoid arthritis ([place]) 08/29/2013  . ABDOMINAL PAIN, EPIGASTRIC 08/02/2009  . GERD 07/30/2009  . CONSTIPATION 07/30/2009  . IRRITABLE BOWEL SYNDROME 07/30/2009  . NAUSEA AND VOMITING 07/30/2009  . Nausea without vomiting 07/30/2009  . ABDOMINAL PAIN, GENERALIZED 07/30/2009    Alizey Noren, PTA 02/07/2016, 4:28 PM  [facility] [address] [place], [place], [address] Phone: [phone]   Fax:  [phone]  Name: Patricia Long MRN: [id number] Date of Birth: 11-12-1950

## 2016-02-09 ENCOUNTER — Encounter: Payer: Self-pay | Admitting: Physical Therapy

## 2016-02-09 ENCOUNTER — Ambulatory Visit: Payer: No Typology Code available for payment source | Admitting: Physical Therapy

## 2016-02-09 DIAGNOSIS — M542 Cervicalgia: Secondary | ICD-10-CM | POA: Diagnosis not present

## 2016-02-09 DIAGNOSIS — M6281 Muscle weakness (generalized): Secondary | ICD-10-CM

## 2016-02-09 DIAGNOSIS — Z79899 Other long term (current) drug therapy: Secondary | ICD-10-CM | POA: Diagnosis not present

## 2016-02-09 DIAGNOSIS — G44329 Chronic post-traumatic headache, not intractable: Secondary | ICD-10-CM | POA: Diagnosis not present

## 2016-02-09 DIAGNOSIS — M0589 Other rheumatoid arthritis with rheumatoid factor of multiple sites: Secondary | ICD-10-CM | POA: Diagnosis not present

## 2016-02-09 NOTE — Therapy (Signed)
[facility] [address] [place], [place], [address] Phone: [phone]   Fax:  [phone]  Physical Therapy Treatment  Patient Details  Name: Patricia Long MRN: [id number] Date of Birth: [birth year] Referring Provider: Dr. Delice Lesch  Encounter Date: 02/09/2016      PT End of Session - 02/09/16 1012    Visit Number 12   Number of Visits 20   Date for PT Re-Evaluation 02/24/16   Authorization Type Medicare G codes at 63;  KX at 77   PT Start Time 1006   PT Stop Time 1050   PT Time Calculation (min) 44 min   Activity Tolerance Patient tolerated treatment well   Behavior During Therapy [facility] for tasks assessed/performed      Past Medical History  Diagnosis Date  . Endometriosis   . Fibroid   . Irritable bowel syndrome   . Basal cell carcinoma ~ 2013    "legs"  . PONV (postoperative nausea and vomiting)   . Pneumonia 1970's X 1; ~ 2013  . Chronic bronchitis ([place])     "I've had it several times but not q yr" (01/13/2015)  . Rheumatoid arthritis(714.0)     Past Surgical History  Procedure Laterality Date  . Total abdominal hysterectomy  1997    TAH/BSO  . Pelvic laparoscopy  '82 AND '87  . Foot surgery Bilateral ~ 2006-2008    "reconstruction"  . Joint replacement Left 2009    WRIST AND FINGER   . Knee arthroscopy Right 1980's  . Breast cyst excision Bilateral 1992    NODULE EXCISION OF RIGHT AND LEFT BREAST  . Basal cell carcinoma excision  ~ 2013    "legs"    There were no vitals filed for this visit.      Subjective Assessment - 02/09/16 1011    Subjective Ooh I felt my muscles!   Currently in Pain? No/denies   Multiple Pain Sites No                         [facility] Adult PT Treatment/Exercise - 02/09/16 0001    Neck Exercises: Supine   Other Supine Exercise MELt release with foam roll.   Cervical & Thoracic   Neck Exercises: Prone   Other Prone Exercise Prone 'T" 2x10    VC for softening of the upper traps.    Other Prone Exercise prone "y" 2x10   Shoulder Exercises: Seated   Elevation 10 reps   Horizontal ABduction Strengthening;Both;20 reps;Theraband  2 sets of 10 including diagonals, sitting on blue ball   Theraband Level (Shoulder Horizontal ABduction) Level 3 (Green)   Shoulder Exercises: ROM/Strengthening   UBE (Upper Arm Bike) L2 3x3  Sittin gon blue ball   Shoulder Exercises: Stretch   Other Shoulder Stretches Trigger point rolling on wall x3 min wiht small blue ball   Manual Therapy   Manual Therapy --  prone Trigger Point work along RT thoracic in between prone                 PT Education - 02/09/16 1039    Education Details Prone T & Y for HEP added   Person(s) Educated Patient   Methods Explanation;Demonstration;Verbal cues;Tactile cues;Handout   Comprehension Verbalized understanding;Returned demonstration          PT Short Term Goals - 02/02/16 1110    PT SHORT TERM GOAL #4   Title The patient will have improved cervical flexion  to 55 degrees needed for reading   Time 4   Period Weeks   Status On-going  48           PT Long Term Goals - 02/02/16 1111    PT LONG TERM GOAL #2   Title The patient will have improved cervical rotation to the right to 40 degrees needed for backing her car into the garage   Time 8   Period Weeks   Status On-going  50 degrees but not able to back in with pain at end range   PT LONG TERM GOAL #4   Title Overall pain improved by 60% with usual ADLs like housework and driving   Time 8   Period Weeks   Status On-going               Plan - 02/09/16 1048    Clinical Impression Statement Pt is doing better relaxing and her upper traps and facilitating her scapular muscles more. Added the prone exercises to HEP for advancement. Trigger points felt smaller and less dense.    Rehab Potential Good   Clinical Impairments Affecting Rehab Potential rheumatoid arthritis-no cervical  retractions   PT Frequency 2x / week   PT Duration 8 weeks   PT Treatment/Interventions ADLs/Self Care Home Management;Cryotherapy;Electrical Stimulation;Moist Heat;Therapeutic exercise;Ultrasound;Patient/family education;Manual techniques;Taping;Dry needling   PT Next Visit Plan prone strength, work on trigger points   Consulted and Agree with Plan of Care Patient      Patient will benefit from skilled therapeutic intervention in order to improve the following deficits and impairments:  Pain, Decreased strength, Increased muscle spasms, Decreased range of motion  Visit Diagnosis: Muscle weakness (generalized)  Cervicalgia  Chronic post-traumatic headache, not intractable     Problem List Patient Active Problem List   Diagnosis Date Noted  . Neck pain on right side 12/28/2015  . Postconcussion syndrome 10/25/2015  . Aortic arch atherosclerosis ([place]) 03/08/2015  . Hypertension 01/13/2015  . Chest pain 01/13/2015  . Labile hypertension 01/13/2015  . Rheumatoid arthritis ([place]) 08/29/2013  . ABDOMINAL PAIN, EPIGASTRIC 08/02/2009  . GERD 07/30/2009  . CONSTIPATION 07/30/2009  . IRRITABLE BOWEL SYNDROME 07/30/2009  . NAUSEA AND VOMITING 07/30/2009  . Nausea without vomiting 07/30/2009  . ABDOMINAL PAIN, GENERALIZED 07/30/2009    Sakari Raisanen, PTA 02/09/2016, 10:52 AM  [facility] [address], [place], [place], [address] Phone: [phone]   Fax:  [phone]  Name: Patricia Long MRN: [id number] Date of Birth: May 17, 1951

## 2016-02-09 NOTE — Patient Instructions (Signed)
Laying on your stomach exercises in the corner of your bed:  Support your forehead with a towel.  T exercise: Arms out to the T, RELAX the upper trap muscles first. Then lift & lower the arms focusing on using the shoulder blade muscles & in between the blades. 3 x10  Y exercise but only RT side: Arm is the Y/V position, lift & lower slowly focusing on the shoulder blade muscles.  3 x 10

## 2016-02-14 ENCOUNTER — Ambulatory Visit: Payer: No Typology Code available for payment source | Admitting: Physical Therapy

## 2016-02-14 ENCOUNTER — Encounter: Payer: Self-pay | Admitting: Physical Therapy

## 2016-02-14 DIAGNOSIS — G44329 Chronic post-traumatic headache, not intractable: Secondary | ICD-10-CM | POA: Diagnosis not present

## 2016-02-14 DIAGNOSIS — M542 Cervicalgia: Secondary | ICD-10-CM

## 2016-02-14 DIAGNOSIS — M6281 Muscle weakness (generalized): Secondary | ICD-10-CM | POA: Diagnosis not present

## 2016-02-14 NOTE — Therapy (Signed)
[facility] [address] [place], [place], [address] Phone: [phone]   Fax:  [phone]  Physical Therapy Treatment  Patient Details  Name: Patricia Long MRN: [id number] Date of Birth: April 26, 1951 Referring Provider: Dr. Delice Lesch  Encounter Date: 02/14/2016      PT End of Session - 02/14/16 1151    Visit Number 13   Number of Visits 20   Date for PT Re-Evaluation 02/24/16   Authorization Type Medicare G codes at 13;  KX at 61   PT Start Time 1147   PT Stop Time 1225   PT Time Calculation (min) 38 min   Activity Tolerance Patient tolerated treatment well   Behavior During Therapy [facility] for tasks assessed/performed      Past Medical History  Diagnosis Date  . Endometriosis   . Fibroid   . Irritable bowel syndrome   . Basal cell carcinoma ~ 2013    "legs"  . PONV (postoperative nausea and vomiting)   . Pneumonia 1970's X 1; ~ 2013  . Chronic bronchitis ([place])     "I've had it several times but not q yr" (01/13/2015)  . Rheumatoid arthritis(714.0)     Past Surgical History  Procedure Laterality Date  . Total abdominal hysterectomy  1997    TAH/BSO  . Pelvic laparoscopy  '82 AND '87  . Foot surgery Bilateral ~ 2006-2008    "reconstruction"  . Joint replacement Left 2009    WRIST AND FINGER   . Knee arthroscopy Right 1980's  . Breast cyst excision Bilateral 1992    NODULE EXCISION OF RIGHT AND LEFT BREAST  . Basal cell carcinoma excision  ~ 2013    "legs"    There were no vitals filed for this visit.      Subjective Assessment - 02/14/16 1152    Subjective Doing my homework fairly consistently, only missed one day when she traveled this weekend.                          [place] Adult PT Treatment/Exercise - 02/14/16 0001    Neck Exercises: Supine   Other Supine Exercise MELt release with foam roll.   Cervical & Thoracic   Neck Exercises: Prone   Other Prone Exercise Prone  'T" 2x10   VC for softening of the upper traps. added 1#   Other Prone Exercise prone "y" 2x10   Shoulder Exercises: Seated   Horizontal ABduction Strengthening;Both;20 reps;Theraband  2 sets of 10 including diagonals, sitting on blue ball   Theraband Level (Shoulder Horizontal ABduction) Level 3 (Green)   Shoulder Exercises: ROM/Strengthening   UBE (Upper Arm Bike) L2 3x3  Sittin gon blue ball   Shoulder Exercises: Stretch   Other Shoulder Stretches Trigger point rolling on wall x3 min wiht small blue ball                  PT Short Term Goals - 02/02/16 1110    PT SHORT TERM GOAL #4   Title The patient will have improved cervical flexion to 55 degrees needed for reading   Time 4   Period Weeks   Status On-going  48           PT Long Term Goals - 02/02/16 1111    PT LONG TERM GOAL #2   Title The patient will have improved cervical rotation to the right to 40 degrees needed for backing her car into the  garage   Time 8   Period Weeks   Status On-going  50 degrees but not able to back in with pain at end range   PT LONG TERM GOAL #4   Title Overall pain improved by 60% with usual ADLs like housework and driving   Time 8   Period Weeks   Status On-going               Plan - 02/14/16 1152    Clinical Impression Statement Trigger points still active along the RT trapezius muscle. Added 1# for strengthening today which pt tolerated well.    Rehab Potential Good   Clinical Impairments Affecting Rehab Potential rheumatoid arthritis-no cervical retractions   PT Frequency 2x / week   PT Duration 8 weeks   PT Treatment/Interventions ADLs/Self Care Home Management;Cryotherapy;Electrical Stimulation;Moist Heat;Therapeutic exercise;Ultrasound;Patient/family education;Manual techniques;Taping;Dry needling   PT Next Visit Plan prone strength, work on trigger points   Consulted and Agree with Plan of Care Patient      Patient will benefit from skilled therapeutic  intervention in order to improve the following deficits and impairments:  Pain, Decreased strength, Increased muscle spasms, Decreased range of motion  Visit Diagnosis: Muscle weakness (generalized)  Cervicalgia  Chronic post-traumatic headache, not intractable     Problem List Patient Active Problem List   Diagnosis Date Noted  . Neck pain on right side 12/28/2015  . Postconcussion syndrome 10/25/2015  . Aortic arch atherosclerosis ([place]) 03/08/2015  . Hypertension 01/13/2015  . Chest pain 01/13/2015  . Labile hypertension 01/13/2015  . Rheumatoid arthritis ([place]) 08/29/2013  . ABDOMINAL PAIN, EPIGASTRIC 08/02/2009  . GERD 07/30/2009  . CONSTIPATION 07/30/2009  . IRRITABLE BOWEL SYNDROME 07/30/2009  . NAUSEA AND VOMITING 07/30/2009  . Nausea without vomiting 07/30/2009  . ABDOMINAL PAIN, GENERALIZED 07/30/2009    Patricia Long, PTA 02/14/2016, 12:19 PM  [facility] [address], [place], [place], [address] Phone: [phone]   Fax:  [phone]  Name: Patricia Long MRN: [id number] Date of Birth: 12/08/1950

## 2016-02-15 DIAGNOSIS — M2550 Pain in unspecified joint: Secondary | ICD-10-CM | POA: Diagnosis not present

## 2016-02-15 DIAGNOSIS — M0589 Other rheumatoid arthritis with rheumatoid factor of multiple sites: Secondary | ICD-10-CM | POA: Diagnosis not present

## 2016-02-15 DIAGNOSIS — M542 Cervicalgia: Secondary | ICD-10-CM | POA: Diagnosis not present

## 2016-02-15 DIAGNOSIS — Z79899 Other long term (current) drug therapy: Secondary | ICD-10-CM | POA: Diagnosis not present

## 2016-02-16 ENCOUNTER — Ambulatory Visit: Payer: No Typology Code available for payment source

## 2016-02-16 DIAGNOSIS — M542 Cervicalgia: Secondary | ICD-10-CM

## 2016-02-16 DIAGNOSIS — M6281 Muscle weakness (generalized): Secondary | ICD-10-CM | POA: Diagnosis not present

## 2016-02-16 DIAGNOSIS — G44329 Chronic post-traumatic headache, not intractable: Secondary | ICD-10-CM | POA: Diagnosis not present

## 2016-02-16 NOTE — Therapy (Signed)
[facility] [address] [place], [place], [address] Phone: [phone]   Fax:  [phone]  Physical Therapy Treatment  Patient Details  Name: Patricia Long MRN: [id number] Date of Birth: 05-07-1951 Referring Provider: Dr. Delice Lesch  Encounter Date: 02/16/2016      PT End of Session - 02/16/16 1304    Visit Number 14   Number of Visits 20   Date for PT Re-Evaluation 02/24/16   Authorization Type Medicare G codes at 67;  KX at 25   PT Start Time 1222   PT Stop Time 1315   PT Time Calculation (min) 53 min   Activity Tolerance Patient tolerated treatment well   Behavior During Therapy [facility] for tasks assessed/performed      Past Medical History  Diagnosis Date  . Endometriosis   . Fibroid   . Irritable bowel syndrome   . Basal cell carcinoma ~ 2013    "legs"  . PONV (postoperative nausea and vomiting)   . Pneumonia 1970's X 1; ~ 2013  . Chronic bronchitis ([place])     "I've had it several times but not q yr" (01/13/2015)  . Rheumatoid arthritis(714.0)     Past Surgical History  Procedure Laterality Date  . Total abdominal hysterectomy  1997    TAH/BSO  . Pelvic laparoscopy  '82 AND '87  . Foot surgery Bilateral ~ 2006-2008    "reconstruction"  . Joint replacement Left 2009    WRIST AND FINGER   . Knee arthroscopy Right 1980's  . Breast cyst excision Bilateral 1992    NODULE EXCISION OF RIGHT AND LEFT BREAST  . Basal cell carcinoma excision  ~ 2013    "legs"    There were no vitals filed for this visit.      Subjective Assessment - 02/16/16 1222    Subjective Pt reports that she had a headache last night.     Patient Stated Goals I want the pain to go away, turn without hurting   Currently in Pain? Yes   Pain Score 2    Pain Location Neck   Pain Orientation Right   Pain Descriptors / Indicators Throbbing;Headache   Pain Type Acute pain   Pain Onset More than a month ago   Pain Frequency  Constant   Aggravating Factors  turning head to the Rt   Pain Relieving Factors heat, PT                         [facility] Adult PT Treatment/Exercise - 02/16/16 0001    Shoulder Exercises: Supine   Other Supine Exercises supine on foam roll for decompression   Shoulder Exercises: ROM/Strengthening   UBE (Upper Arm Bike) L2 3x3  Sittin gon blue ball   Moist Heat Therapy   Number Minutes Moist Heat 10 Minutes   Moist Heat Location Cervical   Manual Therapy   Manual Therapy Joint mobilization;Myofascial release;Scapular mobilization   Manual therapy comments Soft tissue elongation and trigger point release to Rt rhomboids, subscapularis and middle trap.          Trigger Point Dry Needling - 02/16/16 1241    Consent Given? Yes   Education Handout Provided Yes   Muscles Treated Upper Body Upper trapezius;Subscapularis;Rhomboids   Upper Trapezius Response Twitch reponse elicited;Palpable increased muscle length   Levator Scapulae Response Twitch response elicited;Palpable increased muscle length   Rhomboids Response Twitch response elicited;Palpable increased muscle length   Subscapularis Response  Palpable increased muscle length                PT Short Term Goals - 02/02/16 1110    PT SHORT TERM GOAL #4   Title The patient will have improved cervical flexion to 55 degrees needed for reading   Time 4   Period Weeks   Status On-going  48           PT Long Term Goals - 02/02/16 1111    PT LONG TERM GOAL #2   Title The patient will have improved cervical rotation to the right to 40 degrees needed for backing her car into the garage   Time 8   Period Weeks   Status On-going  50 degrees but not able to back in with pain at end range   PT LONG TERM GOAL #4   Title Overall pain improved by 60% with usual ADLs like housework and driving   Time 8   Period Weeks   Status On-going               Plan - 02/16/16 1233    Clinical Impression  Statement Pt with flare-up of pain today and headache last night.  Pt with active trigger point over Rt medial scapular border and UT.  Pt with improved tissue length after dry needling today.  Pt will benefit from skilled PT for manual, strength, flexibiity and modalities.     Rehab Potential Good   Clinical Impairments Affecting Rehab Potential rheumatoid arthritis-no cervical retractions   PT Frequency 2x / week   PT Duration 8 weeks   PT Treatment/Interventions ADLs/Self Care Home Management;Cryotherapy;Electrical Stimulation;Moist Heat;Therapeutic exercise;Ultrasound;Patient/family education;Manual techniques;Taping;Dry needling   PT Next Visit Plan prone strength, work on trigger points. Assess response to dry needling.   Consulted and Agree with Plan of Care Patient      Patient will benefit from skilled therapeutic intervention in order to improve the following deficits and impairments:  Pain, Decreased strength, Increased muscle spasms, Decreased range of motion  Visit Diagnosis: Muscle weakness (generalized)  Cervicalgia  Chronic post-traumatic headache, not intractable     Problem List Patient Active Problem List   Diagnosis Date Noted  . Neck pain on right side 12/28/2015  . Postconcussion syndrome 10/25/2015  . Aortic arch atherosclerosis ([place]) 03/08/2015  . Hypertension 01/13/2015  . Chest pain 01/13/2015  . Labile hypertension 01/13/2015  . Rheumatoid arthritis ([place]) 08/29/2013  . ABDOMINAL PAIN, EPIGASTRIC 08/02/2009  . GERD 07/30/2009  . CONSTIPATION 07/30/2009  . IRRITABLE BOWEL SYNDROME 07/30/2009  . NAUSEA AND VOMITING 07/30/2009  . Nausea without vomiting 07/30/2009  . ABDOMINAL PAIN, GENERALIZED 07/30/2009     Sigurd Sos, PT 02/16/2016 1:07 PM  [facility] [address], [place], [place], [address] Phone: [phone]   Fax:  [phone]  Name: Patricia Long MRN:  [id number] Date of Birth: [birth year]

## 2016-02-17 DIAGNOSIS — G8929 Other chronic pain: Secondary | ICD-10-CM | POA: Diagnosis not present

## 2016-02-17 DIAGNOSIS — Z79899 Other long term (current) drug therapy: Secondary | ICD-10-CM | POA: Diagnosis not present

## 2016-02-17 DIAGNOSIS — M545 Low back pain: Secondary | ICD-10-CM | POA: Diagnosis not present

## 2016-02-17 DIAGNOSIS — M0579 Rheumatoid arthritis with rheumatoid factor of multiple sites without organ or systems involvement: Secondary | ICD-10-CM | POA: Diagnosis not present

## 2016-02-22 ENCOUNTER — Ambulatory Visit: Payer: No Typology Code available for payment source

## 2016-02-22 DIAGNOSIS — M542 Cervicalgia: Secondary | ICD-10-CM | POA: Diagnosis not present

## 2016-02-22 DIAGNOSIS — M6281 Muscle weakness (generalized): Secondary | ICD-10-CM | POA: Diagnosis not present

## 2016-02-22 DIAGNOSIS — G44329 Chronic post-traumatic headache, not intractable: Secondary | ICD-10-CM

## 2016-02-22 NOTE — Therapy (Signed)
[facility] [address], [place], [place], [address] Phone: [phone]   Fax:  [phone]  Physical Therapy Treatment  Patient Details  Name: Patricia Long MRN: [id number] Date of Birth: 01/04/1951 Referring Provider: Dr. Delice Lesch  Encounter Date: 02/22/2016      PT End of Session - 02/22/16 1531    Visit Number 15   Number of Visits 20   Date for PT Re-Evaluation 04/21/16   Authorization Type Medicare G codes at 56;  KX at 46   PT Start Time 1443   PT Stop Time 1537   PT Time Calculation (min) 54 min   Activity Tolerance Patient tolerated treatment well   Behavior During Therapy [facility] for tasks assessed/performed      Past Medical History  Diagnosis Date  . Endometriosis   . Fibroid   . Irritable bowel syndrome   . Basal cell carcinoma ~ 2013    "legs"  . PONV (postoperative nausea and vomiting)   . Pneumonia 1970's X 1; ~ 2013  . Chronic bronchitis ([place])     "I've had it several times but not q yr" (01/13/2015)  . Rheumatoid arthritis(714.0)     Past Surgical History  Procedure Laterality Date  . Total abdominal hysterectomy  1997    TAH/BSO  . Pelvic laparoscopy  '82 AND '87  . Foot surgery Bilateral ~ 2006-2008    "reconstruction"  . Joint replacement Left 2009    WRIST AND FINGER   . Knee arthroscopy Right 1980's  . Breast cyst excision Bilateral 1992    NODULE EXCISION OF RIGHT AND LEFT BREAST  . Basal cell carcinoma excision  ~ 2013    "legs"    There were no vitals filed for this visit.      Subjective Assessment - 02/22/16 1440    Subjective Pt reports that needling helped her last session.  Shoulder feels looser.  50-60% overall improvement since the start of care.   Currently in Pain? Yes   Pain Score 0-No pain  3-4/10 average   Pain Orientation Right   Pain Descriptors / Indicators Throbbing;Headache;Tightness;Spasm   Pain Type Acute pain   Pain Onset More than a month  ago   Pain Frequency Constant   Aggravating Factors  not sure   Pain Relieving Factors heat, PT            [facility] PT Assessment - 02/22/16 0001    Assessment   Onset Date/Surgical Date 08/30/15   Prior Function   Level of Independence Independent   Observation/Other Assessments   Focus on Therapeutic Outcomes (FOTO)  41% limitation   AROM   Cervical Flexion 70   Cervical - Right Rotation 60   Cervical - Left Rotation 65                     [facility] Adult PT Treatment/Exercise - 02/22/16 0001    Shoulder Exercises: Supine   Horizontal ABduction Strengthening;Both;20 reps;Theraband   Theraband Level (Shoulder Horizontal ABduction) Level 3 (Green)   Flexion Strengthening;Both;20 reps;Theraband   Theraband Level (Shoulder Flexion) Level 3 (Green)   Shoulder Flexion Weight (lbs) D2   Other Supine Exercises supine on foam roll for decompression   Shoulder Exercises: ROM/Strengthening   UBE (Upper Arm Bike) L2 3x3  Sittin gon blue ball   Moist Heat Therapy   Number Minutes Moist Heat 10 Minutes   Moist Heat Location Cervical   Manual Therapy   Manual  Therapy Joint mobilization;Myofascial release;Scapular mobilization   Manual therapy comments Soft tissue elongation and trigger point release to Rt rhomboids, subscapularis and middle trap.          Trigger Point Dry Needling - 02/22/16 1500    Consent Given? Yes   Muscles Treated Upper Body Upper trapezius;Rhomboids   Rhomboids Response Twitch response elicited;Palpable increased muscle length   Subscapularis Response Twitch response elicited;Palpable increased muscle length                PT Short Term Goals - 02/22/16 1450    PT SHORT TERM GOAL #4   Title The patient will have improved cervical flexion to 55 degrees needed for reading   Status Achieved           PT Long Term Goals - 02/22/16 1447    PT LONG TERM GOAL #1   Title The patient will be independent in safe self progression of HEP   02/24/16   Time 8   Period Weeks   Status On-going   PT LONG TERM GOAL #2   Title The patient will have improved cervical rotation to the right to 40 degrees needed for backing her car into the garage   Time 8   Period Weeks   Status Achieved   PT LONG TERM GOAL #4   Title Overall pain improved by 60% with usual ADLs like housework and driving   Status Achieved   PT LONG TERM GOAL #5   Title FOTO functional outcome score improved from 50% limitation to 38% indicating improved function with less pain   Time 8   Period Weeks   Status On-going   Additional Long Term Goals   Additional Long Term Goals Yes   PT LONG TERM GOAL #6   Title turn head right with driving and backing into garage without limitation or increased pain in the neck   Time 8   Period Weeks   Status New               Plan - 02/22/16 1454    Clinical Impression Statement Pt reports 50-60% overall imprvement since the start of care.  Pt with continued limitation with turning head to the Rt with limited AROM and pain with driving.  FOTO score is 41% limitation today (improved from 50% at evaluation).  Pt with active trigger point over Rt medial scapular border and upper trap.  Pt with improved tissue length and mobility after dry needling.  Pt will continue to benefit from skilled PT for flexiblity, mobs, manual, postural strength and modalities to reduce pain and improve driving without substitution.    Rehab Potential Good   Clinical Impairments Affecting Rehab Potential rheumatoid arthritis-no cervical retractions   PT Frequency 2x / week   PT Duration 8 weeks   PT Treatment/Interventions ADLs/Self Care Home Management;Cryotherapy;Electrical Stimulation;Moist Heat;Therapeutic exercise;Ultrasound;Patient/family education;Manual techniques;Taping;Dry needling   PT Next Visit Plan prone strength, work on trigger points. Work on [employer] cervical AROM   Consulted and Agree with Plan of Care Patient      Patient will  benefit from skilled therapeutic intervention in order to improve the following deficits and impairments:  Pain, Decreased strength, Increased muscle spasms, Decreased range of motion  Visit Diagnosis: Muscle weakness (generalized) - Plan: PT plan of care cert/re-cert  Cervicalgia - Plan: PT plan of care cert/re-cert  Chronic post-traumatic headache, not intractable - Plan: PT plan of care cert/re-cert     Problem List Patient Active Problem List  Diagnosis Date Noted  . Neck pain on right side 12/28/2015  . Postconcussion syndrome 10/25/2015  . Aortic arch atherosclerosis ([place]) 03/08/2015  . Hypertension 01/13/2015  . Chest pain 01/13/2015  . Labile hypertension 01/13/2015  . Rheumatoid arthritis ([place]) 08/29/2013  . ABDOMINAL PAIN, EPIGASTRIC 08/02/2009  . GERD 07/30/2009  . CONSTIPATION 07/30/2009  . IRRITABLE BOWEL SYNDROME 07/30/2009  . NAUSEA AND VOMITING 07/30/2009  . Nausea without vomiting 07/30/2009  . ABDOMINAL PAIN, GENERALIZED 07/30/2009     Sigurd Sos, PT 02/22/2016 3:36 PM  [facility] [address] [place], [place], [address] Phone: [phone]   Fax:  [phone]  Name: Patricia Long MRN: [id number] Date of Birth: [birth year]

## 2016-02-23 ENCOUNTER — Ambulatory Visit: Payer: No Typology Code available for payment source

## 2016-02-23 DIAGNOSIS — M069 Rheumatoid arthritis, unspecified: Secondary | ICD-10-CM | POA: Diagnosis not present

## 2016-02-23 DIAGNOSIS — M542 Cervicalgia: Secondary | ICD-10-CM | POA: Diagnosis not present

## 2016-02-23 DIAGNOSIS — G44329 Chronic post-traumatic headache, not intractable: Secondary | ICD-10-CM

## 2016-02-23 DIAGNOSIS — Z79899 Other long term (current) drug therapy: Secondary | ICD-10-CM | POA: Diagnosis not present

## 2016-02-23 DIAGNOSIS — M6281 Muscle weakness (generalized): Secondary | ICD-10-CM

## 2016-02-23 NOTE — Therapy (Addendum)
[facility] [address], [place], [place], [address] Phone: [phone]   Fax:  [phone]  Physical Therapy Treatment/Discharge Summary  Patient Details  Name: Patricia Long MRN: [id number] Date of Birth: 12/15/1950 Referring Provider: Dr. Delice Lesch  Encounter Date: 02/23/2016      PT End of Session - 02/23/16 1443    Visit Number 16   Number of Visits 20   Date for PT Re-Evaluation 04/21/16   Authorization Type Medicare G codes at 23;  KX at 66   PT Start Time 1400   PT Stop Time 1454   PT Time Calculation (min) 54 min   Activity Tolerance Patient tolerated treatment well   Behavior During Therapy [facility] for tasks assessed/performed      Past Medical History  Diagnosis Date  . Endometriosis   . Fibroid   . Irritable bowel syndrome   . Basal cell carcinoma ~ 2013    "legs"  . PONV (postoperative nausea and vomiting)   . Pneumonia 1970's X 1; ~ 2013  . Chronic bronchitis ([place])     "I've had it several times but not q yr" (01/13/2015)  . Rheumatoid arthritis(714.0)     Past Surgical History  Procedure Laterality Date  . Total abdominal hysterectomy  1997    TAH/BSO  . Pelvic laparoscopy  '82 AND '87  . Foot surgery Bilateral ~ 2006-2008    "reconstruction"  . Joint replacement Left 2009    WRIST AND FINGER   . Knee arthroscopy Right 1980's  . Breast cyst excision Bilateral 1992    NODULE EXCISION OF RIGHT AND LEFT BREAST  . Basal cell carcinoma excision  ~ 2013    "legs"    There were no vitals filed for this visit.      Subjective Assessment - 02/23/16 1400    Subjective A little sore after last session but not bad.     Patient Stated Goals I want the pain to go away, turn without hurting   Pain Score 0-No pain                         [facility] Adult PT Treatment/Exercise - 02/23/16 0001    Shoulder Exercises: Supine   Horizontal ABduction Strengthening;Both;20  reps;Theraband   Theraband Level (Shoulder Horizontal ABduction) Level 3 (Green)   Flexion Strengthening;Both;20 reps;Theraband   Theraband Level (Shoulder Flexion) Level 3 (Green)   Shoulder Flexion Weight (lbs) D2   Other Supine Exercises supine on foam roll for decompression   Shoulder Exercises: Seated   Flexion Strengthening;Both;20 reps;Weights   Flexion Weight (lbs) 1   Abduction Both;20 reps;Weights   ABduction Weight (lbs) 1  scaption 1# 2x10   Shoulder Exercises: ROM/Strengthening   UBE (Upper Arm Bike) L2 4/4 x 8 minutes  sitting on blue ball   Moist Heat Therapy   Number Minutes Moist Heat 10 Minutes   Moist Heat Location Cervical   Manual Therapy   Manual Therapy Joint mobilization;Myofascial release;Scapular mobilization   Manual therapy comments soft tissue elongation to bil. neck, UT and PROM into rotation with contract relax          Trigger Point Dry Needling - 02/22/16 1500    Consent Given? Yes   Muscles Treated Upper Body Upper trapezius;Rhomboids   Rhomboids Response Twitch response elicited;Palpable increased muscle length   Subscapularis Response Twitch response elicited;Palpable increased muscle length  PT Short Term Goals - 02/22/16 1450    PT SHORT TERM GOAL #4   Title The patient will have improved cervical flexion to 55 degrees needed for reading   Status Achieved           PT Long Term Goals - 02/22/16 1447    PT LONG TERM GOAL #1   Title The patient will be independent in safe self progression of HEP  02/24/16   Time 8   Period Weeks   Status On-going   PT LONG TERM GOAL #2   Title The patient will have improved cervical rotation to the right to 40 degrees needed for backing her car into the garage   Time 8   Period Weeks   Status Achieved   PT LONG TERM GOAL #4   Title Overall pain improved by 60% with usual ADLs like housework and driving   Status Achieved   PT LONG TERM GOAL #5   Title FOTO functional  outcome score improved from 50% limitation to 38% indicating improved function with less pain   Time 8   Period Weeks   Status On-going   Additional Long Term Goals   Additional Long Term Goals Yes   PT LONG TERM GOAL #6   Title turn head right with driving and backing into garage without limitation or increased pain in the neck   Time 8   Period Weeks   Status New               Plan - 02/23/16 1407    Clinical Impression Statement Pt reports 50-60% overall improvement since the start of care.  Pt with continued limitation with turning head to the Rt with limited AROM and pain with driving.  FOTO score is 41% limitation.  Pt with active trigger points over Rt medial scapular border and upper trap.  Pt will continue to benefit from skilled PT for flexibility, mobs, manual and postural strength to reduced pain and improve driving without substitution.     Rehab Potential Good   Clinical Impairments Affecting Rehab Potential rheumatoid arthritis-no cervical retractions   PT Frequency 2x / week   PT Duration 8 weeks   PT Treatment/Interventions ADLs/Self Care Home Management;Cryotherapy;Electrical Stimulation;Moist Heat;Therapeutic exercise;Ultrasound;Patient/family education;Manual techniques;Taping;Dry needling   PT Next Visit Plan prone strength, work on trigger points. Work on [employer] cervical AROM   Consulted and Agree with Plan of Care Patient      Patient will benefit from skilled therapeutic intervention in order to improve the following deficits and impairments:  Pain, Decreased strength, Increased muscle spasms, Decreased range of motion  Visit Diagnosis: Muscle weakness (generalized)  Cervicalgia  Chronic post-traumatic headache, not intractable  PHYSICAL THERAPY DISCHARGE SUMMARY  Visits from Start of Care: 16  Current functional level related to goals / functional outcomes: The patient had to discontinue PT secondary to her husband is critically ill in the  hospital   Remaining deficits: See clinical impressions above   Education / Equipment: HEP Plan: Patient agrees to discharge.  Patient goals were partially met. Patient is being discharged due to the patient's request.  ?????     G code:  Carrying, moving and handling objects  Current CK, goal CJ, discharge CK     Problem List Patient Active Problem List   Diagnosis Date Noted  . Neck pain on right side 12/28/2015  . Postconcussion syndrome 10/25/2015  . Aortic arch atherosclerosis ([place]) 03/08/2015  . Hypertension 01/13/2015  . Chest pain 01/13/2015  .  Labile hypertension 01/13/2015  . Rheumatoid arthritis ([place]) 08/29/2013  . ABDOMINAL PAIN, EPIGASTRIC 08/02/2009  . GERD 07/30/2009  . CONSTIPATION 07/30/2009  . IRRITABLE BOWEL SYNDROME 07/30/2009  . NAUSEA AND VOMITING 07/30/2009  . Nausea without vomiting 07/30/2009  . ABDOMINAL PAIN, GENERALIZED 07/30/2009   Ruben Im, PT 04/13/2016 7:51 AM Phone: [phone] Fax: [phone]  Sigurd Sos, PT 02/23/2016 2:45 PM  [facility] [address] [place], [place], [address] Phone: [phone]   Fax:  [phone]  Name: Patricia Long MRN: [id number] Date of Birth: 1950/10/16

## 2016-02-28 ENCOUNTER — Encounter: Payer: Medicare Other | Admitting: Physical Therapy

## 2016-03-06 ENCOUNTER — Encounter: Payer: Medicare Other | Admitting: Physical Therapy

## 2016-04-03 ENCOUNTER — Encounter: Payer: Medicare Other | Admitting: Physical Therapy

## 2016-04-05 DIAGNOSIS — Z79899 Other long term (current) drug therapy: Secondary | ICD-10-CM | POA: Diagnosis not present

## 2016-04-05 DIAGNOSIS — M0589 Other rheumatoid arthritis with rheumatoid factor of multiple sites: Secondary | ICD-10-CM | POA: Diagnosis not present

## 2016-05-24 DIAGNOSIS — M0589 Other rheumatoid arthritis with rheumatoid factor of multiple sites: Secondary | ICD-10-CM | POA: Diagnosis not present

## 2016-05-24 DIAGNOSIS — M2550 Pain in unspecified joint: Secondary | ICD-10-CM | POA: Diagnosis not present

## 2016-05-24 DIAGNOSIS — Z79899 Other long term (current) drug therapy: Secondary | ICD-10-CM | POA: Diagnosis not present

## 2016-05-31 DIAGNOSIS — M0589 Other rheumatoid arthritis with rheumatoid factor of multiple sites: Secondary | ICD-10-CM | POA: Diagnosis not present

## 2016-05-31 DIAGNOSIS — Z79899 Other long term (current) drug therapy: Secondary | ICD-10-CM | POA: Diagnosis not present

## 2016-06-16 DIAGNOSIS — B078 Other viral warts: Secondary | ICD-10-CM | POA: Diagnosis not present

## 2016-06-16 DIAGNOSIS — D0461 Carcinoma in situ of skin of right upper limb, including shoulder: Secondary | ICD-10-CM | POA: Diagnosis not present

## 2016-06-16 DIAGNOSIS — X32XXXD Exposure to sunlight, subsequent encounter: Secondary | ICD-10-CM | POA: Diagnosis not present

## 2016-06-16 DIAGNOSIS — D225 Melanocytic nevi of trunk: Secondary | ICD-10-CM | POA: Diagnosis not present

## 2016-06-16 DIAGNOSIS — L570 Actinic keratosis: Secondary | ICD-10-CM | POA: Diagnosis not present

## 2016-06-26 ENCOUNTER — Ambulatory Visit: Payer: Medicare Other | Admitting: Neurology

## 2016-06-28 ENCOUNTER — Encounter: Payer: Self-pay | Admitting: Neurology

## 2016-06-28 ENCOUNTER — Ambulatory Visit (INDEPENDENT_AMBULATORY_CARE_PROVIDER_SITE_OTHER): Payer: Medicare Other | Admitting: Neurology

## 2016-06-28 VITALS — BP 140/68 | HR 59 | Temp 98.3°F | Ht 68.0 in | Wt 129.6 lb

## 2016-06-28 DIAGNOSIS — F0781 Postconcussional syndrome: Secondary | ICD-10-CM
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
## 2016-06-28 NOTE — Patient Instructions (Signed)
You look great. Wishing your husband and family the best. Follow-up on an as needed basis, call our office for any changes.

## 2016-07-26 DIAGNOSIS — Z79899 Other long term (current) drug therapy: Secondary | ICD-10-CM | POA: Diagnosis not present

## 2016-07-26 DIAGNOSIS — M0589 Other rheumatoid arthritis with rheumatoid factor of multiple sites: Secondary | ICD-10-CM | POA: Diagnosis not present

## 2016-08-22 DIAGNOSIS — L570 Actinic keratosis: Secondary | ICD-10-CM | POA: Diagnosis not present

## 2016-08-22 DIAGNOSIS — X32XXXD Exposure to sunlight, subsequent encounter: Secondary | ICD-10-CM | POA: Diagnosis not present

## 2016-08-22 DIAGNOSIS — Z08 Encounter for follow-up examination after completed treatment for malignant neoplasm: Secondary | ICD-10-CM | POA: Diagnosis not present

## 2016-08-22 DIAGNOSIS — L820 Inflamed seborrheic keratosis: Secondary | ICD-10-CM | POA: Diagnosis not present

## 2016-08-22 DIAGNOSIS — Z85828 Personal history of other malignant neoplasm of skin: Secondary | ICD-10-CM | POA: Diagnosis not present

## 2016-08-24 DIAGNOSIS — Z79899 Other long term (current) drug therapy: Secondary | ICD-10-CM | POA: Diagnosis not present

## 2016-08-24 DIAGNOSIS — Z681 Body mass index (BMI) 19 or less, adult: Secondary | ICD-10-CM | POA: Diagnosis not present

## 2016-08-24 DIAGNOSIS — M0589 Other rheumatoid arthritis with rheumatoid factor of multiple sites: Secondary | ICD-10-CM | POA: Diagnosis not present

## 2016-08-24 DIAGNOSIS — M2550 Pain in unspecified joint: Secondary | ICD-10-CM | POA: Diagnosis not present

## 2016-09-14 ENCOUNTER — Ambulatory Visit (INDEPENDENT_AMBULATORY_CARE_PROVIDER_SITE_OTHER): Payer: Medicare Other | Admitting: Women's Health

## 2016-09-14 ENCOUNTER — Encounter: Payer: Self-pay | Admitting: Women's Health

## 2016-09-14 DIAGNOSIS — Z79890 Hormone replacement therapy (postmenopausal): Secondary | ICD-10-CM

## 2016-09-14 MED ORDER — ESTRADIOL 1 MG PO TABS: 1.0000 mg | ORAL_TABLET | Freq: Every day | ORAL | 3 refills | Status: DC

## 2016-09-14 NOTE — Patient Instructions (Addendum)
Health Maintenance for Postmenopausal Women Introduction Menopause is a normal process in which your reproductive ability comes to an end. This process happens gradually over a span of months to years, usually between the ages of 20 and 50. Menopause is complete when you have missed 12 consecutive menstrual periods. It is important to talk with your health care provider about some of the most common conditions that affect postmenopausal women, such as heart disease, cancer, and bone loss (osteoporosis). Adopting a healthy lifestyle and getting preventive care can help to promote your health and wellness. Those actions can also lower your chances of developing some of these common conditions. What should I know about menopause? During menopause, you may experience a number of symptoms, such as:  Moderate-to-severe hot flashes.  Night sweats.  Decrease in sex drive.  Mood swings.  Headaches.  Tiredness.  Irritability.  Memory problems.  Insomnia. Choosing to treat or not to treat menopausal changes is an individual decision that you make with your health care provider. What should I know about hormone replacement therapy and supplements? Hormone therapy products are effective for treating symptoms that are associated with menopause, such as hot flashes and night sweats. Hormone replacement carries certain risks, especially as you become older. If you are thinking about using estrogen or estrogen with progestin treatments, discuss the benefits and risks with your health care provider. What should I know about heart disease and stroke? Heart disease, heart attack, and stroke become more likely as you age. This may be due, in part, to the hormonal changes that your body experiences during menopause. These can affect how your body processes dietary fats, triglycerides, and cholesterol. Heart attack and stroke are both medical emergencies. There are many things that you can do to help prevent  heart disease and stroke:  Have your blood pressure checked at least every 1-2 years. High blood pressure causes heart disease and increases the risk of stroke.  If you are 49-18 years old, ask your health care provider if you should take aspirin to prevent a heart attack or a stroke.  Do not use any tobacco products, including cigarettes, chewing tobacco, or electronic cigarettes. If you need help quitting, ask your health care provider.  It is important to eat a healthy diet and maintain a healthy weight.  Be sure to include plenty of vegetables, fruits, low-fat dairy products, and lean protein.  Avoid eating foods that are high in solid fats, added sugars, or salt (sodium).  Get regular exercise. This is one of the most important things that you can do for your health.  Try to exercise for at least 150 minutes each week. The type of exercise that you do should increase your heart rate and make you sweat. This is known as moderate-intensity exercise.  Try to do strengthening exercises at least twice each week. Do these in addition to the moderate-intensity exercise.  Know your numbers.Ask your health care provider to check your cholesterol and your blood glucose. Continue to have your blood tested as directed by your health care provider. What should I know about cancer screening? There are several types of cancer. Take the following steps to reduce your risk and to catch any cancer development as early as possible. Breast Cancer  Practice breast self-awareness.  This means understanding how your breasts normally appear and feel.  It also means doing regular breast self-exams. Let your health care provider know about any changes, no matter how small.  If you are 40 or  older, have a clinician do a breast exam (clinical breast exam or CBE) every year. Depending on your age, family history, and medical history, it may be recommended that you also have a yearly breast X-ray  (mammogram).  If you have a family history of breast cancer, talk with your health care provider about genetic screening.  If you are at high risk for breast cancer, talk with your health care provider about having an MRI and a mammogram every year.  Breast cancer (BRCA) gene test is recommended for women who have family members with BRCA-related cancers. Results of the assessment will determine the need for genetic counseling and BRCA1 and for BRCA2 testing. BRCA-related cancers include these types:  Breast. This occurs in males or females.  Ovarian.  Tubal. This may also be called fallopian tube cancer.  Cancer of the abdominal or pelvic lining (peritoneal cancer).  Prostate.  Pancreatic. Cervical, Uterine, and Ovarian Cancer  Your health care provider may recommend that you be screened regularly for cancer of the pelvic organs. These include your ovaries, uterus, and vagina. This screening involves a pelvic exam, which includes checking for microscopic changes to the surface of your cervix (Pap test).  For women ages 21-65, health care providers may recommend a pelvic exam and a Pap test every three years. For women ages 50-65, they may recommend the Pap test and pelvic exam, combined with testing for human papilloma virus (HPV), every five years. Some types of HPV increase your risk of cervical cancer. Testing for HPV may also be done on women of any age who have unclear Pap test results.  Other health care providers may not recommend any screening for nonpregnant women who are considered low risk for pelvic cancer and have no symptoms. Ask your health care provider if a screening pelvic exam is right for you.  If you have had past treatment for cervical cancer or a condition that could lead to cancer, you need Pap tests and screening for cancer for at least 20 years after your treatment. If Pap tests have been discontinued for you, your risk factors (such as having a new sexual  partner) need to be reassessed to determine if you should start having screenings again. Some women have medical problems that increase the chance of getting cervical cancer. In these cases, your health care provider may recommend that you have screening and Pap tests more often.  If you have a family history of uterine cancer or ovarian cancer, talk with your health care provider about genetic screening.  If you have vaginal bleeding after reaching menopause, tell your health care provider.  There are currently no reliable tests available to screen for ovarian cancer. Lung Cancer  Lung cancer screening is recommended for adults 59-37 years old who are at high risk for lung cancer because of a history of smoking. A yearly low-dose CT scan of the lungs is recommended if you:  Currently smoke.  Have a history of at least 30 pack-years of smoking and you currently smoke or have quit within the past 15 years. A pack-year is smoking an average of one pack of cigarettes per day for one year. Yearly screening should:  Continue until it has been 15 years since you quit.  Stop if you develop a health problem that would prevent you from having lung cancer treatment. Colorectal Cancer  This type of cancer can be detected and can often be prevented.  Routine colorectal cancer screening usually begins at age 9 and  continues through age 73.  If you have risk factors for colon cancer, your health care provider may recommend that you be screened at an earlier age.  If you have a family history of colorectal cancer, talk with your health care provider about genetic screening.  Your health care provider may also recommend using home test kits to check for hidden blood in your stool.  A small camera at the end of a tube can be used to examine your colon directly (sigmoidoscopy or colonoscopy). This is done to check for the earliest forms of colorectal cancer.  Direct examination of the colon should be  repeated every 5-10 years until age 39. However, if early forms of precancerous polyps or small growths are found or if you have a family history or genetic risk for colorectal cancer, you may need to be screened more often. Skin Cancer  Check your skin from head to toe regularly.  Monitor any moles. Be sure to tell your health care provider:  About any new moles or changes in moles, especially if there is a change in a mole's shape or color.  If you have a mole that is larger than the size of a pencil eraser.  If any of your family members has a history of skin cancer, especially at a young age, talk with your health care provider about genetic screening.  Always use sunscreen. Apply sunscreen liberally and repeatedly throughout the day.  Whenever you are outside, protect yourself by wearing long sleeves, pants, a wide-brimmed hat, and sunglasses. What should I know about osteoporosis? Osteoporosis is a condition in which bone destruction happens more quickly than new bone creation. After menopause, you may be at an increased risk for osteoporosis. To help prevent osteoporosis or the bone fractures that can happen because of osteoporosis, the following is recommended:  If you are 59-26 years old, get at least 1,000 mg of calcium and at least 600 mg of vitamin D per day.  If you are older than age 1 but younger than age 67, get at least 1,200 mg of calcium and at least 600 mg of vitamin D per day.  If you are older than age 38, get at least 1,200 mg of calcium and at least 800 mg of vitamin D per day. Smoking and excessive alcohol intake increase the risk of osteoporosis. Eat foods that are rich in calcium and vitamin D, and do weight-bearing exercises several times each week as directed by your health care provider. What should I know about how menopause affects my mental health? Depression may occur at any age, but it is more common as you become older. Common symptoms of depression  include:  Low or sad mood.  Changes in sleep patterns.  Changes in appetite or eating patterns.  Feeling an overall lack of motivation or enjoyment of activities that you previously enjoyed.  Frequent crying spells. Talk with your health care provider if you think that you are experiencing depression. What should I know about immunizations? It is important that you get and maintain your immunizations. These include:  Tetanus, diphtheria, and pertussis (Tdap) booster vaccine.  Influenza every year before the flu season begins.  Pneumonia vaccine.  Shingles vaccine. Your health care provider may also recommend other immunizations. This information is not intended to replace advice given to you by your health care provider. Make sure you discuss any questions you have with your health care provider. Document Released: 11/03/2005 Document Revised: 03/31/2016 Document Reviewed: 06/15/2015  2017  Elsevier  Seborrheic Keratosis Seborrheic keratosis is a common, noncancerous (benign) skin growth. This condition causes waxy, rough, tan, brown, or black spots to appear on the skin. These skin growths can be flat or raised. What are the causes? The cause of this condition is not known. What increases the risk? This condition is more likely to develop in:  People who have a family history of seborrheic keratosis.  People who are 32 or older.  People who are pregnant.  People who have had estrogen replacement therapy. What are the signs or symptoms? This condition often occurs on the face, chest, shoulders, back, or other areas. These growths:  Are usually painless, but may become irritated and itchy.  Can be yellow, brown, black, or other colors.  Are slightly raised or have a flat surface.  Are sometimes rough or wart-like in texture.  Are often waxy on the surface.  Are round or oval-shaped.  Sometimes look like they are "stuck on."  Often occur in groups, but may  occur as a single growth. How is this diagnosed? This condition is diagnosed with a medical history and physical exam. A sample of the growth may be tested (skin biopsy). You may need to see a skin specialist (dermatologist). How is this treated? Treatment is not usually needed for this condition, unless the growths are irritated or are often bleeding. You may also choose to have the growths removed if you do not like their appearance. Most commonly, these growths are treated with a procedure in which liquid nitrogen is applied to "freeze" off the growth (cryosurgery). They may also be burned off with electricity or cut off. Follow these instructions at home:  Watch your growth for any changes.  Keep all follow-up visits as told by your health care provider. This is important.  Do not scratch or pick at the growth or growths. This can cause them to become irritated or infected. Contact a health care provider if:  You suddenly have many new growths.  Your growth bleeds, itches, or hurts.  Your growth suddenly becomes larger or changes color. This information is not intended to replace advice given to you by your health care provider. Make sure you discuss any questions you have with your health care provider. Document Released: 10/14/2010 Document Revised: 02/17/2016 Document Reviewed: 01/27/2015 Elsevier Interactive Patient Education  2017 [employer].

## 2016-09-14 NOTE — Progress Notes (Signed)
Patricia Long Patricia Long [birth year] [id number]    History:    Presents for breast and pelvic exam.  1997 TAH with BSO for fibroids on  Estrace.  Normal Pap and mammogram history.  1992 negative breast biopsy.  2010 negative colonoscopy.  DEXA primary care, reports as normal. Has had Pneumovax not Zostavax, has Rheumatoid Arthritis on methotrexate.  Past medical history, past surgical history, family history and social history were all reviewed and documented in the EPIC chart.  Husband 3 recent strokes, 2 in June and another in October.  Recently home from skilled nursing, W/C bound.     ROS:  A ROS was performed and pertinent positives and negatives are included.  Exam:  Vitals:   09/14/16 1015  BP: 120/78  Weight: 129 lb (58.5 kg)  Height: 5\' 8"  (1.727 m)   Body mass index is 19.61 kg/m.   General appearance:  Normal Thyroid:  Symmetrical, normal in size, without palpable masses or nodularity. Respiratory  Auscultation:  Clear without wheezing or rhonchi Cardiovascular  Auscultation:  Regular rate, without rubs, murmurs or gallops  Edema/varicosities:  Not grossly evident Abdominal  Soft,nontender, without masses, guarding or rebound.  Liver/spleen:  No organomegaly noted  Hernia:  None appreciated  Skin  Inspection:  Grossly normal   Breasts: Examined lying and sitting.     Right: Without masses, retractions, discharge or axillary adenopathy.     Left: Without masses, retractions, discharge or axillary adenopathy. Gentitourinary   Inguinal/mons:  Normal without inguinal adenopathy  External genitalia:  Normal  BUS/Urethra/Skene's glands:  Normal  Vagina:  Normal  Cervix:  Absent  Uterus:  Absent    Adnexa/parametria:     Rt: Without masses or tenderness.   Lt: Without masses or tenderness.  Anus and perineum: Normal  Digital rectal exam: Normal sphincter tone without palpated masses or tenderness  Assessment/Plan:  65 y.o.  MWF G1P0 for breast and pelvic exam with no  complaints.  TAH with BSO, on Estrace Rheumatiod arthritis Situational stress-declining husbands health [facility] care manages labs and DEXA.  Plan: Will try to decrease Estrace to 0.5 mg, reviewed risk of blood clots, stroke, and breast cancer. Reports numerous hot flashes when tries to decrease HRT, aware of risks. Prescription for Estrace 1 mg by mouth given will try to use half tablet daily. SBEs, continue annual mammogram.  Daily exercise, calcium rich diet.  Encouraged to seek outside help for  care of husband.   [place], 11:15 AM 09/14/2016

## 2016-09-20 DIAGNOSIS — M0589 Other rheumatoid arthritis with rheumatoid factor of multiple sites: Secondary | ICD-10-CM | POA: Diagnosis not present

## 2016-09-20 DIAGNOSIS — Z79899 Other long term (current) drug therapy: Secondary | ICD-10-CM | POA: Diagnosis not present

## 2016-10-10 DIAGNOSIS — M069 Rheumatoid arthritis, unspecified: Secondary | ICD-10-CM | POA: Diagnosis not present

## 2016-10-10 DIAGNOSIS — Z79899 Other long term (current) drug therapy: Secondary | ICD-10-CM | POA: Diagnosis not present

## 2016-10-26 DIAGNOSIS — M0579 Rheumatoid arthritis with rheumatoid factor of multiple sites without organ or systems involvement: Secondary | ICD-10-CM | POA: Diagnosis not present

## 2016-10-26 DIAGNOSIS — Z79899 Other long term (current) drug therapy: Secondary | ICD-10-CM | POA: Diagnosis not present

## 2016-10-26 DIAGNOSIS — M3500 Sicca syndrome, unspecified: Secondary | ICD-10-CM | POA: Diagnosis not present

## 2016-11-07 DIAGNOSIS — M069 Rheumatoid arthritis, unspecified: Secondary | ICD-10-CM | POA: Diagnosis not present

## 2016-11-07 DIAGNOSIS — H43391 Other vitreous opacities, right eye: Secondary | ICD-10-CM | POA: Diagnosis not present

## 2016-11-07 DIAGNOSIS — H5203 Hypermetropia, bilateral: Secondary | ICD-10-CM | POA: Diagnosis not present

## 2016-11-07 DIAGNOSIS — H524 Presbyopia: Secondary | ICD-10-CM | POA: Diagnosis not present

## 2016-11-07 DIAGNOSIS — D3132 Benign neoplasm of left choroid: Secondary | ICD-10-CM | POA: Diagnosis not present

## 2016-11-07 DIAGNOSIS — H52203 Unspecified astigmatism, bilateral: Secondary | ICD-10-CM | POA: Diagnosis not present

## 2016-11-07 DIAGNOSIS — H04123 Dry eye syndrome of bilateral lacrimal glands: Secondary | ICD-10-CM | POA: Diagnosis not present

## 2016-11-07 DIAGNOSIS — Z79899 Other long term (current) drug therapy: Secondary | ICD-10-CM | POA: Diagnosis not present

## 2016-11-15 DIAGNOSIS — Z79899 Other long term (current) drug therapy: Secondary | ICD-10-CM | POA: Diagnosis not present

## 2016-11-15 DIAGNOSIS — M0589 Other rheumatoid arthritis with rheumatoid factor of multiple sites: Secondary | ICD-10-CM | POA: Diagnosis not present

## 2016-12-08 IMAGING — CT CT HEAD W/O CM
1 series · 16 of 30 positions shown, 20 images · non-contrast
Comparison: None

CLINICAL DATA: Elevated blood pressure, palpitations, chest
tightness, bad headache since [REDACTED], initial encounter

EXAM:
CT HEAD WITHOUT CONTRAST
TECHNIQUE: Contiguous axial images were obtained from the base of the skull
through the vertex without intravenous contrast.

[Series 2: head 5.0 h30s · axial · 0.42mm/px · z∈[-136,-1]mm · 16 of 31 slices shown, 20 images]
[im 2/31  brain]
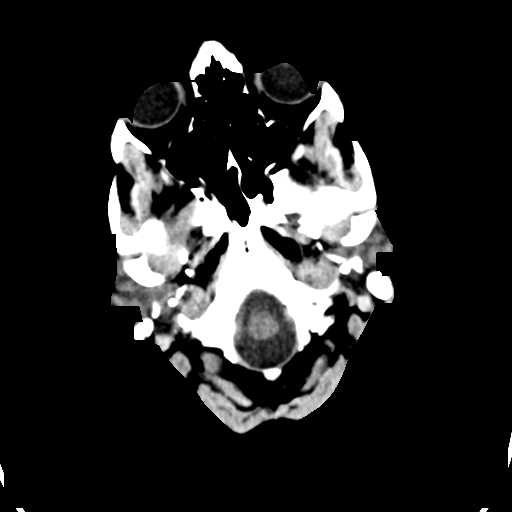
[im 2/31  bone]
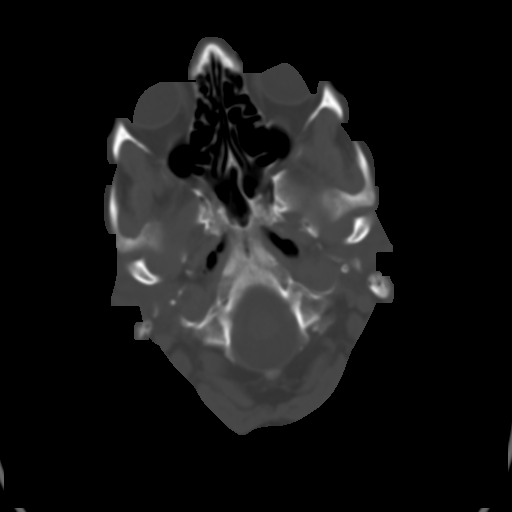
[im 4/31  brain]
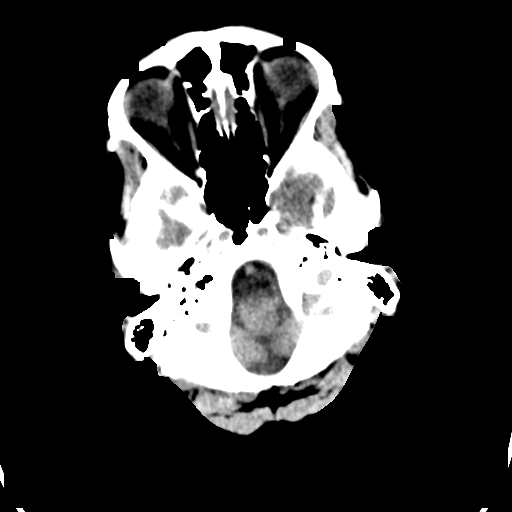
[im 6/31  brain]
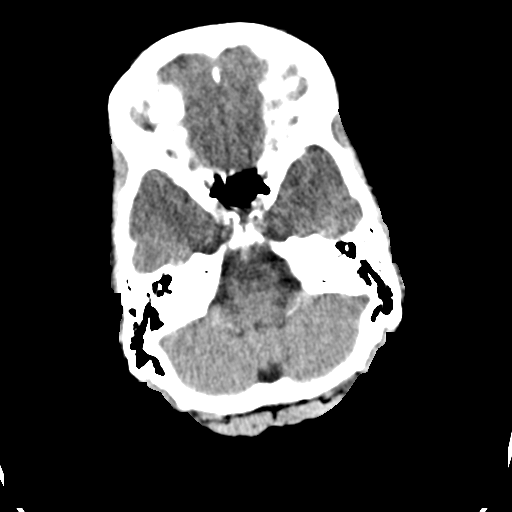
[im 8/31  brain]
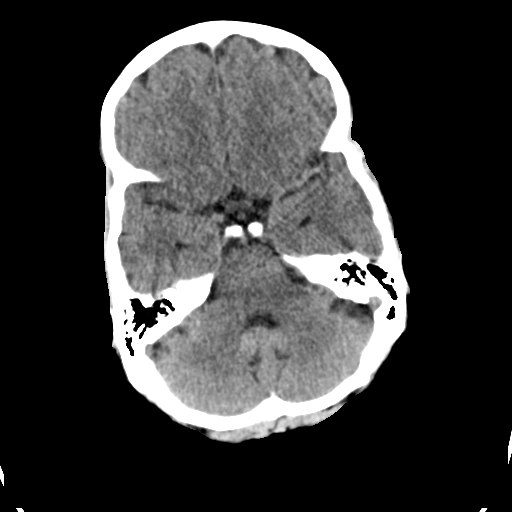
[im 9/31  brain]
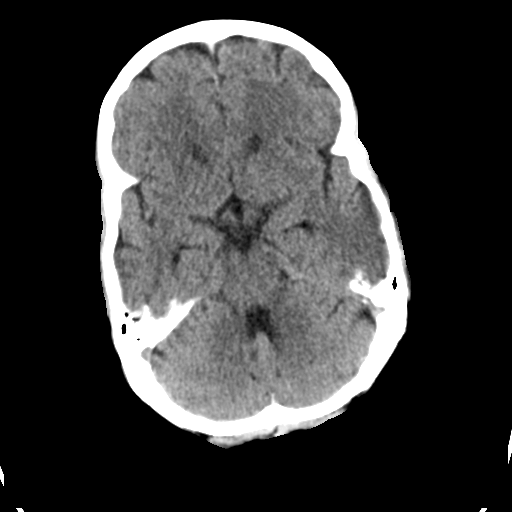
[im 9/31  bone]
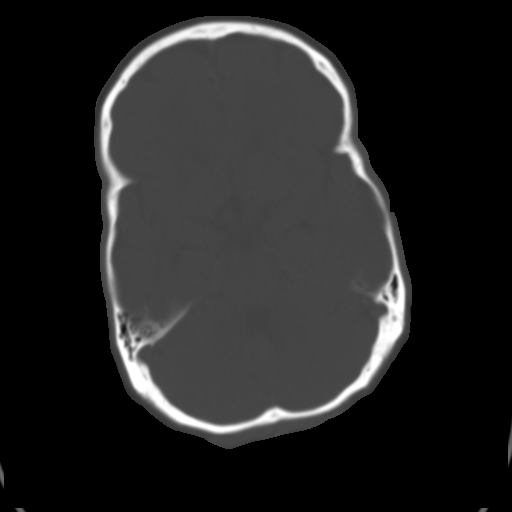
[im 11/31  brain]
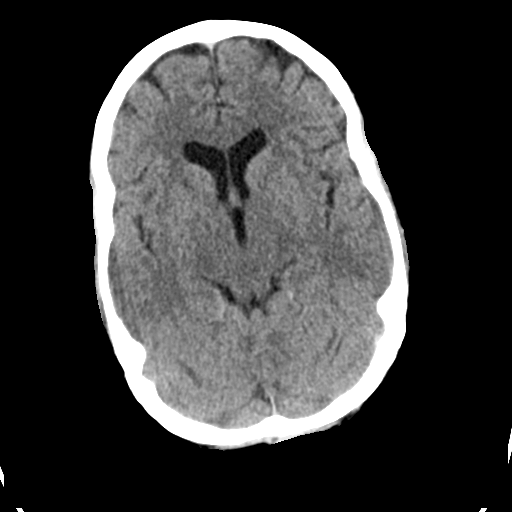
[im 13/31  brain]
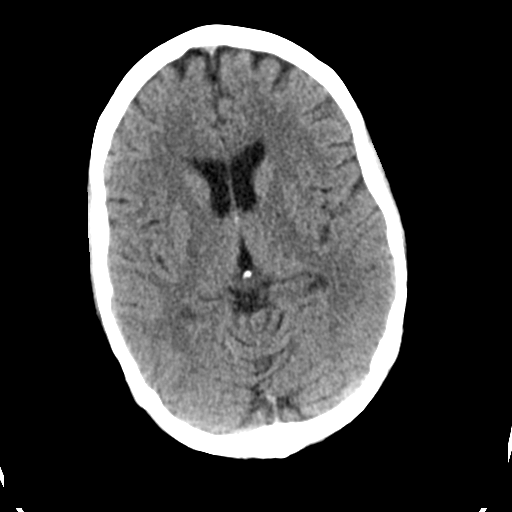
[im 15/31  brain]
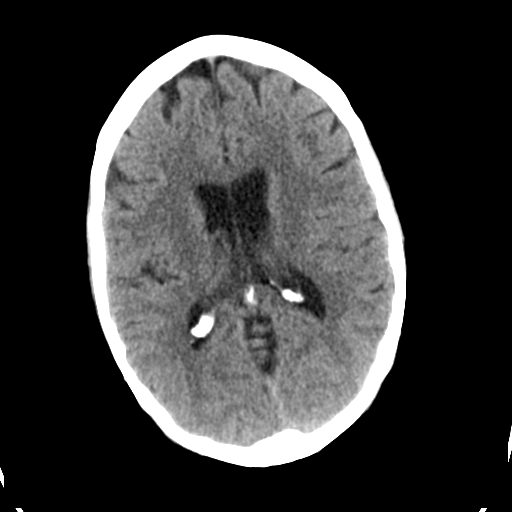
[im 16/31  brain]
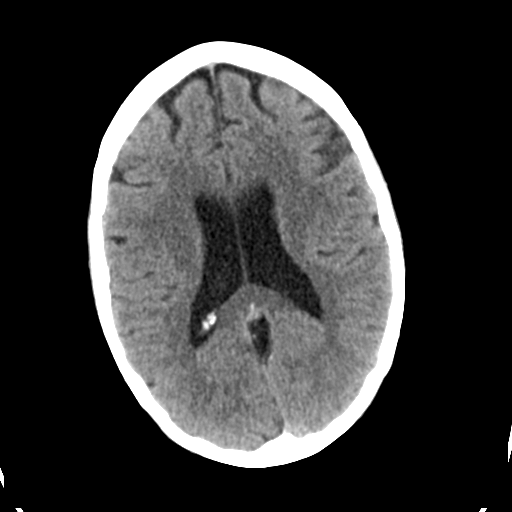
[im 16/31  bone]
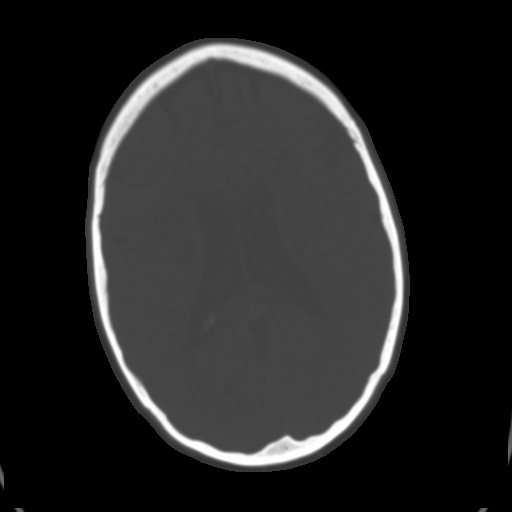
[im 18/31  brain]
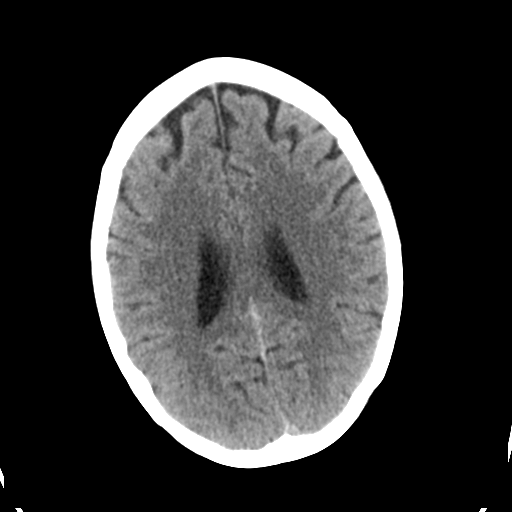
[im 20/31  brain]
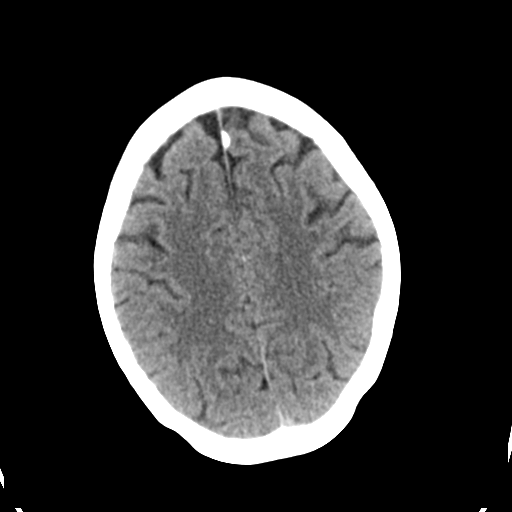
[im 22/31  brain]
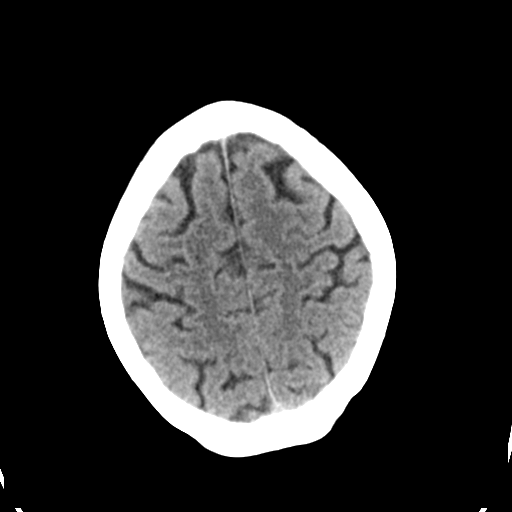
[im 23/31  brain]
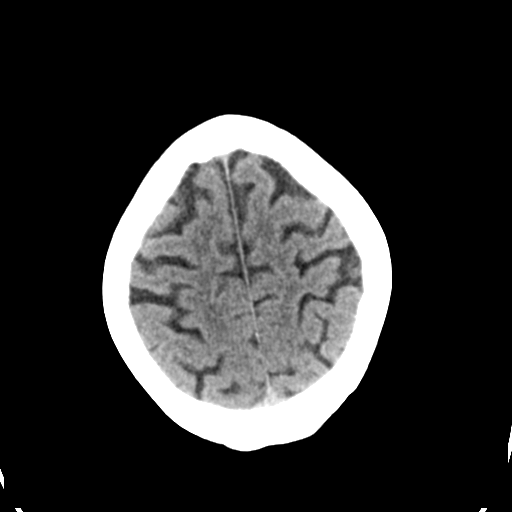
[im 23/31  bone]
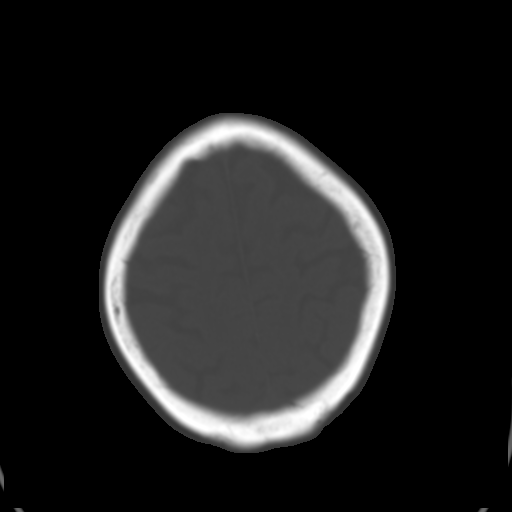
[im 25/31  brain]
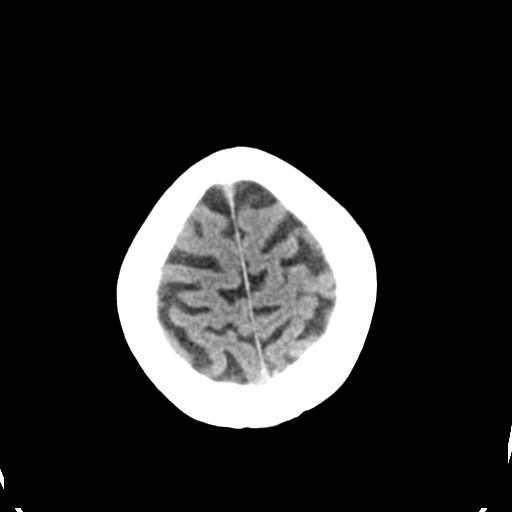
[im 27/31  brain]
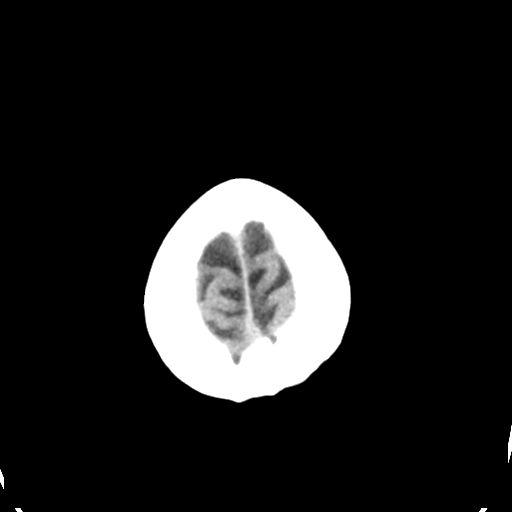
[im 29/31  brain]
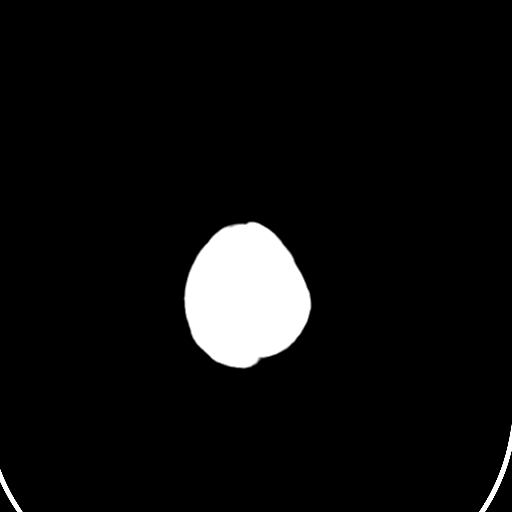

[16 of 30 positions shown; findings below may reference images not displayed]

FINDINGS: Minimal atrophy.

Normal ventricular morphology.

No midline shift or mass effect.

Otherwise normal appearance of brain parenchyma.

No intracranial hemorrhage, mass lesion or evidence acute
infarction.

No extra-axial fluid collections.

Atherosclerotic calcifications at the carotid siphons.

Bones and sinuses unremarkable.
IMPRESSION: No acute intracranial abnormalities.

## 2016-12-08 IMAGING — CR DG CHEST 2V
2 series · 2 of 2 positions shown · non-contrast
Comparison: 12/06/2009

CLINICAL DATA: 63-year-old female with hypertension and chest
pressure for 3 days. Initial encounter.

EXAM:
CHEST  2 VIEW

[chest pa]
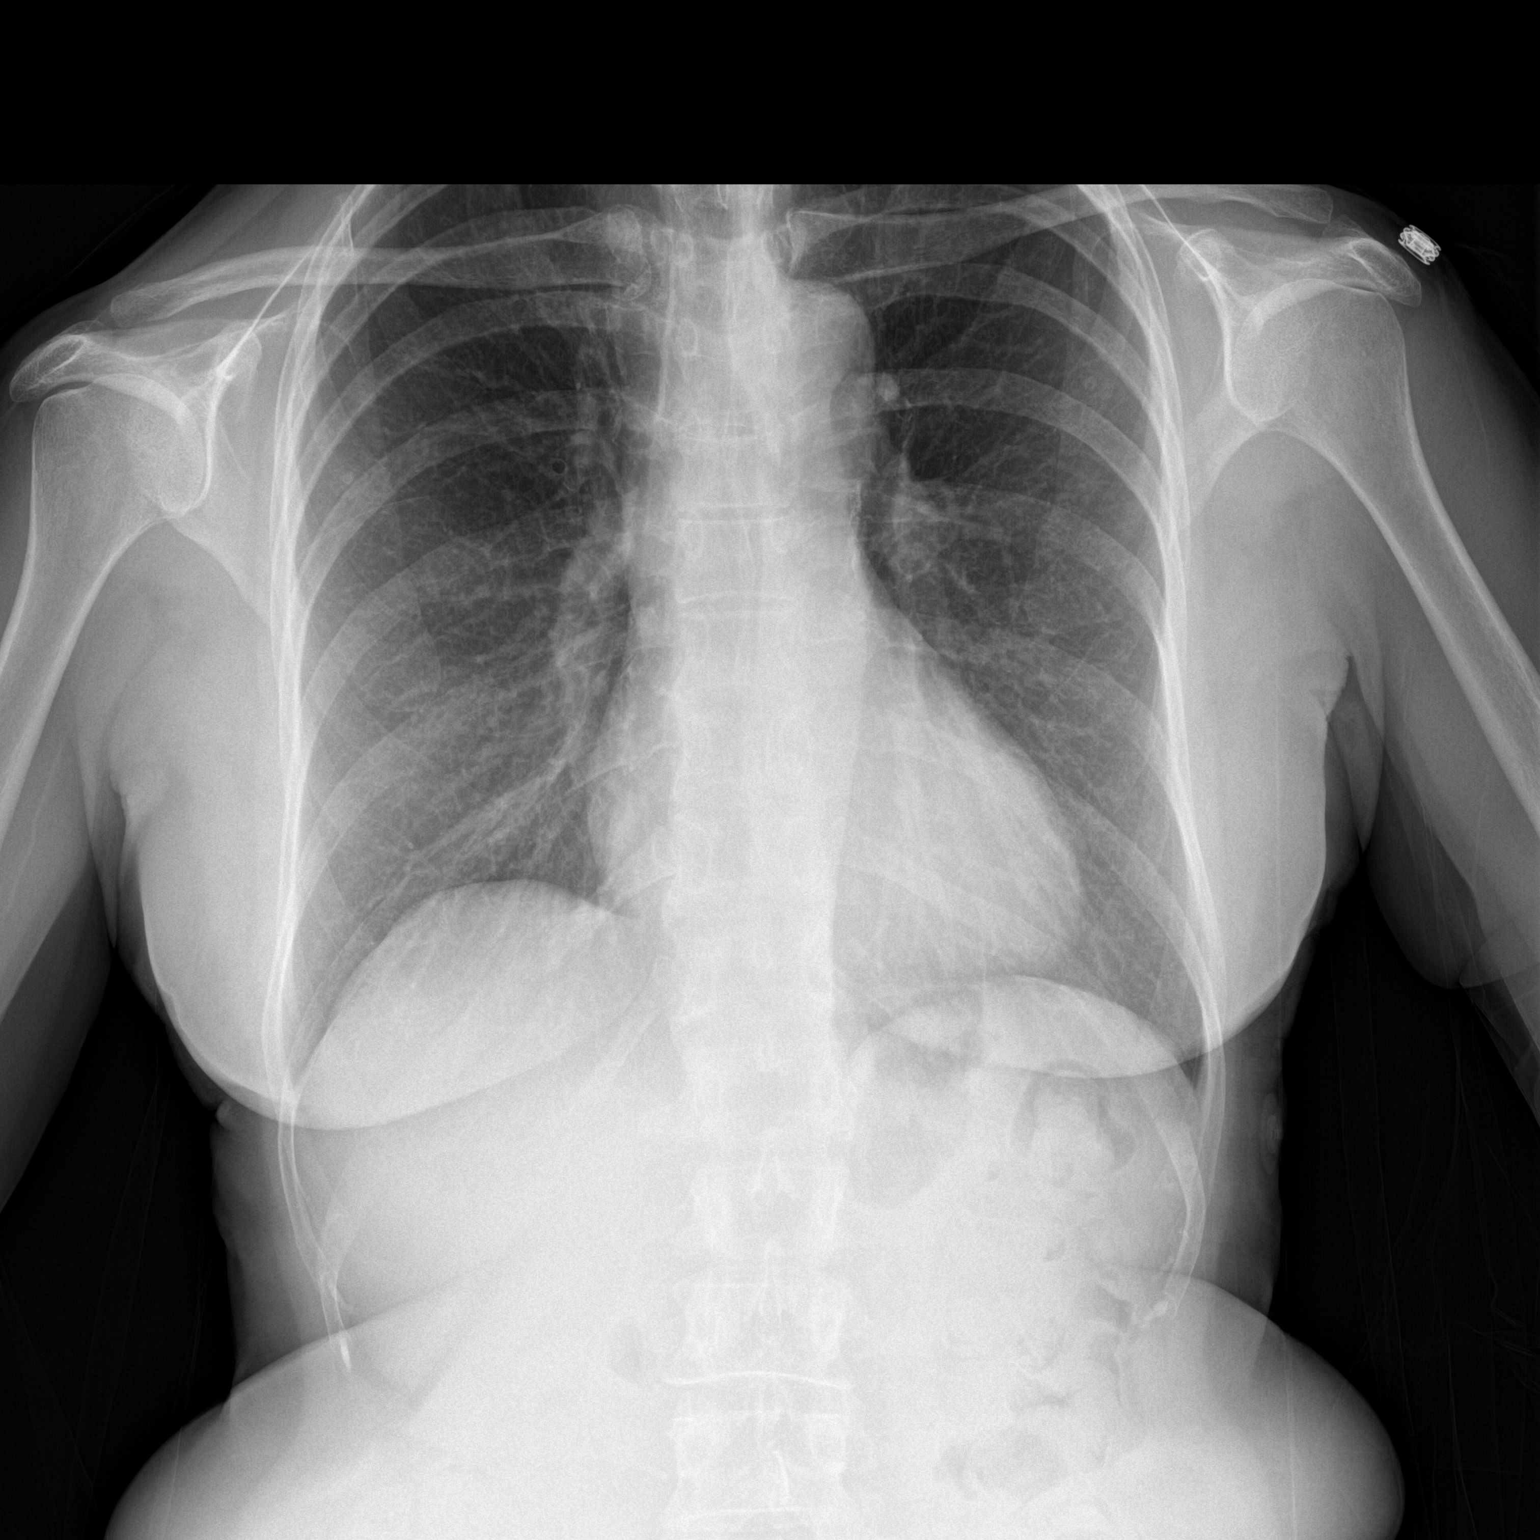

[chest lat]
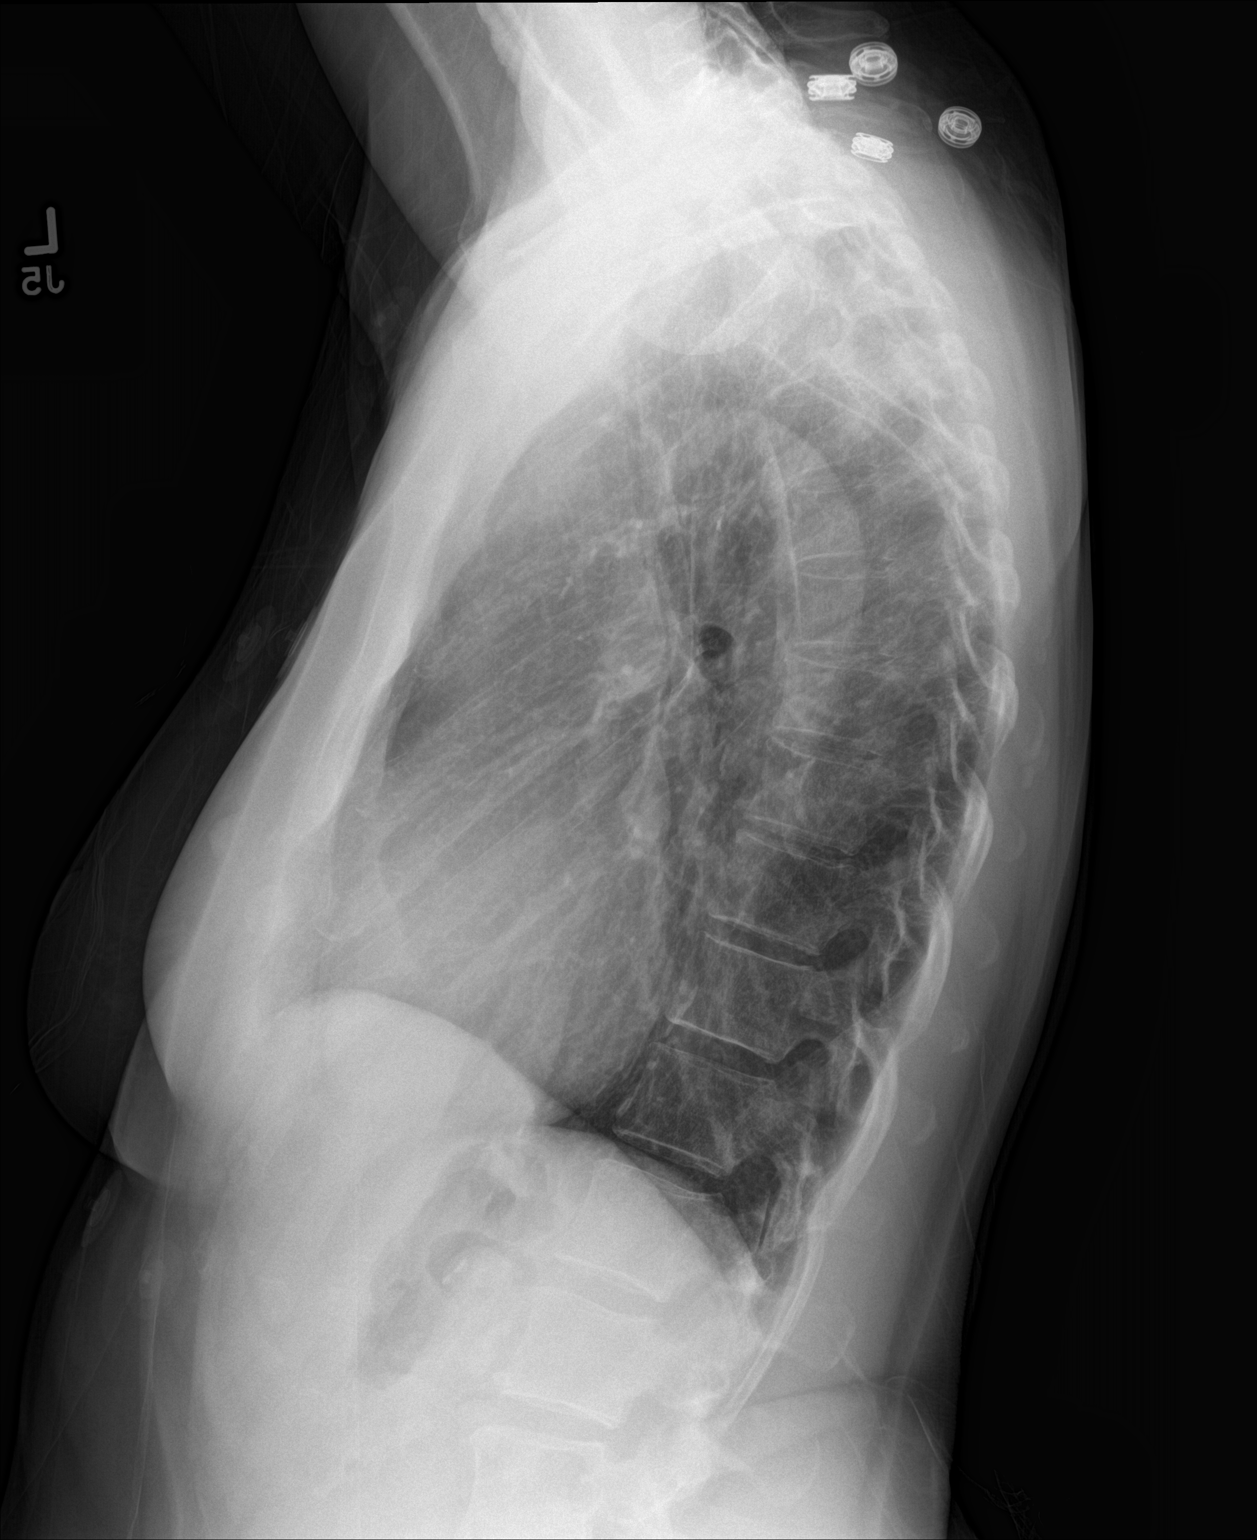

[2 of 2 positions shown; findings below may reference images not displayed]

FINDINGS: Stable lung volumes. Mild cardiomegaly has not significantly
changed. Other mediastinal contours are within normal limits.
Visualized tracheal air column is within normal limits. No
pneumothorax, pulmonary edema, pleural effusion or confluent
pulmonary opacity. No acute osseous abnormality identified.
Calcified atherosclerosis of the aorta.
IMPRESSION: No acute cardiopulmonary abnormality.

## 2016-12-25 DIAGNOSIS — M0589 Other rheumatoid arthritis with rheumatoid factor of multiple sites: Secondary | ICD-10-CM | POA: Diagnosis not present

## 2016-12-25 DIAGNOSIS — M2550 Pain in unspecified joint: Secondary | ICD-10-CM | POA: Diagnosis not present

## 2016-12-25 DIAGNOSIS — Z681 Body mass index (BMI) 19 or less, adult: Secondary | ICD-10-CM | POA: Diagnosis not present

## 2016-12-25 DIAGNOSIS — Z79899 Other long term (current) drug therapy: Secondary | ICD-10-CM | POA: Diagnosis not present

## 2016-12-25 DIAGNOSIS — M310 Hypersensitivity angiitis: Secondary | ICD-10-CM | POA: Diagnosis not present

## 2017-01-01 DIAGNOSIS — Z1389 Encounter for screening for other disorder: Secondary | ICD-10-CM | POA: Diagnosis not present

## 2017-01-01 DIAGNOSIS — K219 Gastro-esophageal reflux disease without esophagitis: Secondary | ICD-10-CM | POA: Diagnosis not present

## 2017-01-01 DIAGNOSIS — Z0001 Encounter for general adult medical examination with abnormal findings: Secondary | ICD-10-CM | POA: Diagnosis not present

## 2017-01-01 DIAGNOSIS — R030 Elevated blood-pressure reading, without diagnosis of hypertension: Secondary | ICD-10-CM | POA: Diagnosis not present

## 2017-01-01 DIAGNOSIS — M8580 Other specified disorders of bone density and structure, unspecified site: Secondary | ICD-10-CM | POA: Diagnosis not present

## 2017-01-01 DIAGNOSIS — M0540 Rheumatoid myopathy with rheumatoid arthritis of unspecified site: Secondary | ICD-10-CM | POA: Diagnosis not present

## 2017-01-01 DIAGNOSIS — E1165 Type 2 diabetes mellitus with hyperglycemia: Secondary | ICD-10-CM | POA: Diagnosis not present

## 2017-01-02 DIAGNOSIS — B079 Viral wart, unspecified: Secondary | ICD-10-CM | POA: Diagnosis not present

## 2017-01-02 DIAGNOSIS — L958 Other vasculitis limited to the skin: Secondary | ICD-10-CM | POA: Diagnosis not present

## 2017-01-02 DIAGNOSIS — B078 Other viral warts: Secondary | ICD-10-CM | POA: Diagnosis not present

## 2017-01-08 ENCOUNTER — Telehealth: Payer: Self-pay | Admitting: Cardiovascular Disease

## 2017-01-08 NOTE — Telephone Encounter (Signed)
Spoke with pt, since the 9th her blood pressure has consistently 196-144/70-60 but will occ be 24-23 diastolic. She does not have dizziness but more of a no energy feeling. She reports she will wake in the middle of the night with her head pounding and a funny feeling in her chest. She complains of fatigue and feeling the need to sit down. She has been under a lot of stress the last year due to her husband having a stroke but she states this did not start then. She request an appointment to see dr croitoru, she does not want to see APP. Follow up appointment scheduled. She will bring a list of bp's and medications to the visit.

## 2017-01-08 NOTE — Telephone Encounter (Signed)
New message      Pt c/o BP issue: STAT if pt c/o blurred vision, one-sided weakness or slurred speech  1. What are your last 5 BP readings? 9th 196/78, sun 3am 194/79, sun pm 144/65 2. Are you having any other symptoms (ex. Dizziness, headache, blurred vision, passed out)? Lightheadedness, chest feels "weird" at times, 3. What is your BP issue? Pt states that her bp is "all over the place".  Please advise

## 2017-01-10 DIAGNOSIS — M0589 Other rheumatoid arthritis with rheumatoid factor of multiple sites: Secondary | ICD-10-CM | POA: Diagnosis not present

## 2017-01-10 DIAGNOSIS — Z79899 Other long term (current) drug therapy: Secondary | ICD-10-CM | POA: Diagnosis not present

## 2017-01-29 ENCOUNTER — Ambulatory Visit (INDEPENDENT_AMBULATORY_CARE_PROVIDER_SITE_OTHER): Payer: Medicare Other | Admitting: Cardiovascular Disease

## 2017-01-29 ENCOUNTER — Encounter: Payer: Self-pay | Admitting: Cardiovascular Disease

## 2017-01-29 VITALS — BP 148/80 | HR 59 | Ht 68.0 in | Wt 129.2 lb

## 2017-01-29 DIAGNOSIS — I10 Essential (primary) hypertension: Secondary | ICD-10-CM

## 2017-01-29 DIAGNOSIS — I700 Atherosclerosis of aorta: Secondary | ICD-10-CM

## 2017-01-29 MED ORDER — CARVEDILOL 6.25 MG PO TABS: 6.2500 mg | ORAL_TABLET | Freq: Two times a day (BID) | ORAL | 3 refills | Status: DC

## 2017-01-29 MED ORDER — CARVEDILOL 6.25 MG PO TABS: 6.2500 mg | ORAL_TABLET | Freq: Two times a day (BID) | ORAL | 0 refills | Status: DC

## 2017-01-29 NOTE — Progress Notes (Signed)
Cardiology Office Note:    Date:  01/29/2017   ID:  Patricia, Long 1950/11/12, MRN [id number]  PCP:  Patricia Huddle, MD  Cardiologist:  Patricia Klein, MD    Referring MD: Patricia Huddle, MD   Chief Complaint  Patient presents with  . Follow-up    elevated BP, chest pain nd pressure, fluttering    History of Present Illness:    Patricia Long is a 66 y.o. female with a hx of Borderline hypertension, seropositive rheumatoid arthritis (Dr. Marcelline Deist), recently with persistently elevated blood pressure.  Since her last appointment, her husband has suffered 3 consecutive strokes and has been diagnosed with aortic aneurysms and an aortic dissection. He is wheelchair-bound and requires her assistance just use the bathroom. There is substantial emotional toll on Patricia Long. They do not have children and she is bearing the brunt of the physical and emotional strain herself.  Her blood pressure has been consistently elevated with the lowest recently recorded systolic blood pressure 267, and the highest in the 198 range. She was given a prescription of amlodipine 2.5 mg to take if needed for elevated blood pressure, and has found that she needs to take it virtually every day. Even so, her blood pressure remains high.  She has also been troubled by palpitations. Denies leg edema, orthopnea, PND, claudication, focal neurological deficits, syncope. She has been unable to exercise due to her home obligations, but remains very lean and eats a very healthy diet. Had a normal echo and normal stress [place] in 2016, but there was evidence of thoracic aortic atherosclerosis on chest x-ray.  Past Medical History:  Diagnosis Date  . Basal cell carcinoma ~ 2013   "legs"  . Chronic bronchitis ([place])    "I've had it several times but not q yr" (01/13/2015)  . Endometriosis   . Fibroid   . Irritable bowel syndrome   . Pneumonia 1970's X 1; ~ 2013  . PONV (postoperative nausea and vomiting)   .  Rheumatoid arthritis(714.0)     Past Surgical History:  Procedure Laterality Date  . BASAL CELL CARCINOMA EXCISION  ~ 2013   "legs"  . BREAST CYST EXCISION Bilateral 1992   NODULE EXCISION OF RIGHT AND LEFT BREAST  . FOOT SURGERY Bilateral ~ 2006-2008   "reconstruction"  . JOINT REPLACEMENT Left 2009   WRIST AND FINGER   . KNEE ARTHROSCOPY Right 1980's  . PELVIC LAPAROSCOPY  '82 AND '87  . TOTAL ABDOMINAL HYSTERECTOMY  1997   TAH/BSO    Current Medications: Current Meds  Medication Sig  . cetirizine (ZYRTEC) 10 MG tablet Take 10 mg by mouth daily as needed (remicaide infusion).  Marland Kitchen estradiol (ESTRACE) 1 MG tablet Take 1 tablet (1 mg total) by mouth daily.  . folic acid (FOLVITE) 1 MG tablet Take 1 mg by mouth daily.  . hydroxychloroquine (PLAQUENIL) 200 MG tablet Take 200 mg by mouth daily.  Marland Kitchen inFLIXimab (REMICADE) 100 MG injection Inject 100 mg into the vein every 8 (eight) weeks.  . Methotrexate, Anti-Rheumatic, (METHOTREXATE, PF, [place]) Inject 20 mg into the skin once a week.  . Multiple Vitamin (MULTIVITAMIN) tablet Take 1 tablet by mouth daily.  . [DISCONTINUED] amLODipine (NORVASC) 2.5 MG tablet Take 2.5 mg by mouth as needed. For BP over 150     Allergies:   Prochlorperazine edisylate; Codeine; and Tape   Social History   Social History  . Marital status: Married    Spouse name: N/A  . Number of  children: 0  . Years of education: N/A   Occupational History  . Retired    Social History Main Topics  . Smoking status: Never Smoker  . Smokeless tobacco: Never Used  . Alcohol use 1.2 oz/week    1 Glasses of wine, 1 Standard drinks or equivalent per week  . Drug use: No  . Sexual activity: Not Currently    Birth control/ protection: Surgical   Other Topics Concern  . None   Social History Narrative   Lives with husband in a 2 story home.  Has no children.  Retired from [employer].  Education: some Counsellor.     Family History: The patient's family history  includes Colon cancer in her maternal aunt and paternal uncle; Heart disease in her father and mother. There is no history of Colon polyps, Diabetes, Kidney disease, or Esophageal cancer. ROS:   Please see the history of present illness.     All other systems reviewed and are negative.  EKGs/Labs/Other Studies Reviewed:     EKG:  EKG is  ordered today.  The ekg ordered today demonstrates Normal sinus rhythm, incomplete right bundle branch block, QRS 116 ms, QTC 437 ms   Physical Exam:    VS:  BP (!) 148/80 (BP Location: Right Arm, Patient Position: Sitting, Cuff Size: Normal)   Pulse (!) 59   Ht 5\' 8"  (1.727 m)   Wt 58.6 kg (129 lb 3.2 oz)   BMI 19.64 kg/m     Wt Readings from Last 3 Encounters:  01/29/17 58.6 kg (129 lb 3.2 oz)  09/14/16 58.5 kg (129 lb)  06/28/16 58.8 kg (129 lb 9 oz)     GEN: Well nourished, well developed in no acute distress HEENT: Normal NECK: No JVD; No carotid bruits LYMPHATICS: No lymphadenopathy CARDIAC: RRR, no murmurs, rubs, gallops RESPIRATORY:  Clear to auscultation without rales, wheezing or rhonchi  ABDOMEN: Soft, non-tender, non-distended MUSCULOSKELETAL:  No edema; No deformity  SKIN: Warm and dry NEUROLOGIC:  Alert and oriented x 3 PSYCHIATRIC:  Normal affect   ASSESSMENT:    1. Essential hypertension   2. Aortic arch atherosclerosis (HCC)    PLAN:    In order of problems listed above:  1. HTN: Since she also has palpitations and since there is likely an adrenergic component to her elevated systolic blood pressure, have advised that we switch from amlodipine to carvedilol 6.25 mg twice daily. She'll send [place] some blood pressure recordings in a week or 2 via [email or web address]. Advised that she seek some respite from the 24/7 care that she has to provide her husband. Regular physical exercise may help both with her blood pressure and her emotional status. 2. Ao atherosclerosis: Noted during her workup a couple of years ago. She does not have  any symptoms of vascular coronary disease, but should try to keep LDL less than 100 mg/dL.  Medication Adjustments/Labs and Tests Ordered: Current medicines are reviewed at length with the patient today.  Concerns regarding medicines are outlined above. Labs and tests ordered and medication changes are outlined in the patient instructions below:  Patient Instructions  Dr Sallyanne Kuster has recommended making the following medication changes: 1. STOP Amlodipine 2. START Carvedilol 6.25 mg - take 1 tablet by mouth twice daily  Your physician has requested that you regularly monitor and record your blood pressure readings at home. Please use the same machine to check your readings and record them. Please submit your readings in 2 weeks via mychart to Dr  C.  Dr Kendric Sindelar recommends that you schedule a follow-up appointment in 4-5 months. You will receive a reminder letter in the mail two months in advance. If you don't receive a letter, please call our office to schedule the follow-up appointment.  If you need a refill on your cardiac medications before your next appointment, please call your pharmacy.    Signed, Patricia Klein, MD  01/29/2017 1:13 PM    [place]

## 2017-01-29 NOTE — Addendum Note (Signed)
Addended by: Diana Eves on: 01/29/2017 02:05 PM   Modules accepted: Orders

## 2017-01-29 NOTE — Patient Instructions (Signed)
Dr Sallyanne Kuster has recommended making the following medication changes: 1. STOP Amlodipine 2. START Carvedilol 6.25 mg - take 1 tablet by mouth twice daily  Your physician has requested that you regularly monitor and record your blood pressure readings at home. Please use the same machine to check your readings and record them. Please submit your readings in 2 weeks via mychart to Dr C.  Dr Sallyanne Kuster recommends that you schedule a follow-up appointment in 4-5 months. You will receive a reminder letter in the mail two months in advance. If you don't receive a letter, please call our office to schedule the follow-up appointment.  If you need a refill on your cardiac medications before your next appointment, please call your pharmacy.

## 2017-02-07 ENCOUNTER — Encounter: Payer: Self-pay | Admitting: Gynecology

## 2017-02-09 ENCOUNTER — Encounter: Payer: Self-pay | Admitting: Cardiovascular Disease

## 2017-03-05 ENCOUNTER — Other Ambulatory Visit: Payer: Self-pay

## 2017-03-05 DIAGNOSIS — M0589 Other rheumatoid arthritis with rheumatoid factor of multiple sites: Secondary | ICD-10-CM | POA: Diagnosis not present

## 2017-03-05 DIAGNOSIS — Z79899 Other long term (current) drug therapy: Secondary | ICD-10-CM | POA: Diagnosis not present

## 2017-03-05 DIAGNOSIS — M2550 Pain in unspecified joint: Secondary | ICD-10-CM | POA: Diagnosis not present

## 2017-03-05 DIAGNOSIS — Z681 Body mass index (BMI) 19 or less, adult: Secondary | ICD-10-CM | POA: Diagnosis not present

## 2017-03-06 DIAGNOSIS — M0589 Other rheumatoid arthritis with rheumatoid factor of multiple sites: Secondary | ICD-10-CM | POA: Diagnosis not present

## 2017-03-06 DIAGNOSIS — Z79899 Other long term (current) drug therapy: Secondary | ICD-10-CM | POA: Diagnosis not present

## 2017-03-07 ENCOUNTER — Other Ambulatory Visit: Payer: Self-pay | Admitting: *Deleted

## 2017-03-07 MED ORDER — AMLODIPINE BESYLATE 5 MG PO TABS: 5.0000 mg | ORAL_TABLET | Freq: Every day | ORAL | 3 refills | Status: DC

## 2017-04-13 ENCOUNTER — Other Ambulatory Visit: Payer: Self-pay

## 2017-04-13 MED ORDER — AMLODIPINE BESYLATE 5 MG PO TABS: 5.0000 mg | ORAL_TABLET | Freq: Every day | ORAL | 3 refills | Status: DC

## 2017-04-24 DIAGNOSIS — M0589 Other rheumatoid arthritis with rheumatoid factor of multiple sites: Secondary | ICD-10-CM | POA: Diagnosis not present

## 2017-05-07 DIAGNOSIS — M7541 Impingement syndrome of right shoulder: Secondary | ICD-10-CM | POA: Diagnosis not present

## 2017-05-07 DIAGNOSIS — M542 Cervicalgia: Secondary | ICD-10-CM | POA: Diagnosis not present

## 2017-05-15 DIAGNOSIS — M25511 Pain in right shoulder: Secondary | ICD-10-CM | POA: Diagnosis not present

## 2017-06-05 DIAGNOSIS — M0589 Other rheumatoid arthritis with rheumatoid factor of multiple sites: Secondary | ICD-10-CM | POA: Diagnosis not present

## 2017-06-18 DIAGNOSIS — Z79899 Other long term (current) drug therapy: Secondary | ICD-10-CM | POA: Diagnosis not present

## 2017-06-18 DIAGNOSIS — M2550 Pain in unspecified joint: Secondary | ICD-10-CM | POA: Diagnosis not present

## 2017-06-18 DIAGNOSIS — M0589 Other rheumatoid arthritis with rheumatoid factor of multiple sites: Secondary | ICD-10-CM | POA: Diagnosis not present

## 2017-06-18 DIAGNOSIS — Z681 Body mass index (BMI) 19 or less, adult: Secondary | ICD-10-CM | POA: Diagnosis not present

## 2017-06-27 ENCOUNTER — Other Ambulatory Visit: Payer: Self-pay | Admitting: Women's Health

## 2017-06-27 DIAGNOSIS — Z79890 Hormone replacement therapy (postmenopausal): Secondary | ICD-10-CM

## 2017-07-02 ENCOUNTER — Other Ambulatory Visit: Payer: Self-pay

## 2017-07-02 MED ORDER — AMLODIPINE BESYLATE 5 MG PO TABS: 5.0000 mg | ORAL_TABLET | Freq: Every day | ORAL | 3 refills | Status: DC

## 2017-07-18 DIAGNOSIS — M0589 Other rheumatoid arthritis with rheumatoid factor of multiple sites: Secondary | ICD-10-CM | POA: Diagnosis not present

## 2017-08-28 DIAGNOSIS — Z79899 Other long term (current) drug therapy: Secondary | ICD-10-CM | POA: Diagnosis not present

## 2017-08-28 DIAGNOSIS — M0589 Other rheumatoid arthritis with rheumatoid factor of multiple sites: Secondary | ICD-10-CM | POA: Diagnosis not present

## 2017-09-12 DIAGNOSIS — E785 Hyperlipidemia, unspecified: Secondary | ICD-10-CM | POA: Diagnosis not present

## 2017-09-12 DIAGNOSIS — I10 Essential (primary) hypertension: Secondary | ICD-10-CM | POA: Diagnosis not present

## 2017-09-12 DIAGNOSIS — M0540 Rheumatoid myopathy with rheumatoid arthritis of unspecified site: Secondary | ICD-10-CM | POA: Diagnosis not present

## 2017-09-17 ENCOUNTER — Ambulatory Visit (INDEPENDENT_AMBULATORY_CARE_PROVIDER_SITE_OTHER): Payer: Medicare Other | Admitting: Cardiovascular Disease

## 2017-09-17 ENCOUNTER — Encounter: Payer: Self-pay | Admitting: Cardiovascular Disease

## 2017-09-17 VITALS — BP 130/68 | HR 61 | Ht 68.0 in | Wt 128.4 lb

## 2017-09-17 DIAGNOSIS — I700 Atherosclerosis of aorta: Secondary | ICD-10-CM | POA: Diagnosis not present

## 2017-09-17 DIAGNOSIS — I10 Essential (primary) hypertension: Secondary | ICD-10-CM | POA: Diagnosis not present

## 2017-09-17 MED ORDER — AMLODIPINE BESYLATE 10 MG PO TABS: 10.0000 mg | ORAL_TABLET | Freq: Every day | ORAL | 3 refills | Status: DC

## 2017-09-17 NOTE — Progress Notes (Signed)
Cardiology Office Note:    Date:  09/17/2017   ID:  Patricia Long 1950/11/16, MRN [id number]  PCP:  Josetta Huddle, MD  Cardiologist:  Sanda Klein, MD    Referring MD: Josetta Huddle, MD   Chief Complaint  Patient presents with  . Follow-up    hypertension    History of Present Illness:    Patricia Long is a 66 y.o. female with a hx of Borderline hypertension, seropositive rheumatoid arthritis (Dr. Marcelline Deist), recently with persistently elevated blood pressure.  Despite taking amlodipine 5 mg daily, blood pressure remains consistently elevated, typically around 155/165/65-75. Palpitations have been less of a bother with a few exceptions related to more intense physical activity or emotion. Carvedilol was very poorly tolerated.   The patient specifically denies any chest pain at rest exertion, dyspnea at rest or with exertion, orthopnea, paroxysmal nocturnal dyspnea, syncope, palpitations, focal neurological deficits, intermittent claudication, lower extremity edema, unexplained weight gain, cough, hemoptysis or wheezing.  Remains involved in her husband's care - getting PT, but still wheelchair-bound after strokes.  Had a normal echo and normal stress [place] in 2016, but there was evidence of thoracic aortic atherosclerosis on chest x-ray.  Past Medical History:  Diagnosis Date  . Basal cell carcinoma ~ 2013   "legs"  . Chronic bronchitis ([place])    "I've had it several times but not q yr" (01/13/2015)  . Endometriosis   . Fibroid   . Irritable bowel syndrome   . Pneumonia 1970's X 1; ~ 2013  . PONV (postoperative nausea and vomiting)   . Rheumatoid arthritis(714.0)     Past Surgical History:  Procedure Laterality Date  . BASAL CELL CARCINOMA EXCISION  ~ 2013   "legs"  . BREAST CYST EXCISION Bilateral 1992   NODULE EXCISION OF RIGHT AND LEFT BREAST  . FOOT SURGERY Bilateral ~ 2006-2008   "reconstruction"  . JOINT REPLACEMENT Left 2009   WRIST AND FINGER     . KNEE ARTHROSCOPY Right 1980's  . PELVIC LAPAROSCOPY  '82 AND '87  . TOTAL ABDOMINAL HYSTERECTOMY  1997   TAH/BSO    Current Medications: Current Meds  Medication Sig  . amLODipine (NORVASC) 5 MG tablet Take 1 tablet (5 mg total) by mouth daily.  . cetirizine (ZYRTEC) 10 MG tablet Take 10 mg by mouth daily as needed (remicaide infusion).  Marland Kitchen estradiol (ESTRACE) 1 MG tablet TAKE 1 TABLET BY MOUTH  DAILY  . folic acid (FOLVITE) 1 MG tablet Take 1 mg by mouth daily.  . hydroxychloroquine (PLAQUENIL) 200 MG tablet Take 200 mg by mouth daily.  Marland Kitchen inFLIXimab (REMICADE) 100 MG injection Inject 100 mg into the vein every 8 (eight) weeks.  . Methotrexate, Anti-Rheumatic, (METHOTREXATE, PF, [place]) Inject 20 mg into the skin once a week.  . Multiple Vitamin (MULTIVITAMIN) tablet Take 1 tablet by mouth daily.     Allergies:   Prochlorperazine edisylate; Prochlorperazine edisylate; Codeine; Prochlorperazine; and Tape   Social History   Socioeconomic History  . Marital status: Married    Spouse name: None  . Number of children: 0  . Years of education: None  . Highest education level: None  Social Needs  . Financial resource strain: None  . Food insecurity - worry: None  . Food insecurity - inability: None  . Transportation needs - medical: None  . Transportation needs - non-medical: None  Occupational History  . Occupation: Retired  Tobacco Use  . Smoking status: Never Smoker  . Smokeless tobacco: Never  Used  Substance and Sexual Activity  . Alcohol use: Yes    Alcohol/week: 1.2 oz    Types: 1 Glasses of wine, 1 Standard drinks or equivalent per week  . Drug use: No  . Sexual activity: Not Currently    Birth control/protection: Surgical  Other Topics Concern  . None  Social History Narrative   Lives with husband in a 2 story home.  Has no children.  Retired from [employer].  Education: some Counsellor.     Family History: The patient's family history includes Colon cancer in  her maternal aunt and paternal uncle; Heart disease in her father and mother. There is no history of Colon polyps, Diabetes, Kidney disease, or Esophageal cancer. ROS:   Please see the history of present illness.     All other systems reviewed and are negative.  EKGs/Labs/Other Studies Reviewed:     EKG:  EKG is  ordered today.  The ekg ordered today demonstrates NSR, incomplete right bundle branch block, QRS 112 ms, QTC 436 ms  Physical Exam:    VS:  BP 130/68   Pulse 61   Ht 5\' 8"  (1.727 m)   Wt 128 lb 6.4 oz (58.2 kg)   BMI 19.52 kg/m     Wt Readings from Last 3 Encounters:  09/17/17 128 lb 6.4 oz (58.2 kg)  01/29/17 129 lb 3.2 oz (58.6 kg)  09/14/16 129 lb (58.5 kg)    General: Alert, oriented x3, no distress, lean and fit Head: no evidence of trauma, PERRL, EOMI, no exophtalmos or lid lag, no myxedema, no xanthelasma; normal ears, nose and oropharynx Neck: normal jugular venous pulsations and no hepatojugular reflux; brisk carotid pulses without delay and no carotid bruits Chest: clear to auscultation, no signs of consolidation by percussion or palpation, normal fremitus, symmetrical and full respiratory excursions Cardiovascular: normal position and quality of the apical impulse, regular rhythm, normal first and second heart sounds, no murmurs, rubs or gallops Abdomen: no tenderness or distention, no masses by palpation, no abnormal pulsatility or arterial bruits, normal bowel sounds, no hepatosplenomegaly Extremities: no clubbing, cyanosis or edema; 2+ radial, ulnar and brachial pulses bilaterally; 2+ right femoral, posterior tibial and dorsalis pedis pulses; 2+ left femoral, posterior tibial and dorsalis pedis pulses; no subclavian or femoral bruits Neurological: grossly nonfocal Psych: Normal mood and affect  Lipid Panel  No results found for: CHOL, TRIG, HDL, CHOLHDL, VLDL, LDLCALC, LDLDIRECT   ASSESSMENT:    1. Essential hypertension   2. Aortic atherosclerosis  ([place])   3. Aortic arch atherosclerosis (HCC)    PLAN:    In order of problems listed above:  1. HTN: increase amlodipine to 10 mg daily. Regular physical exercise may help both with her blood pressure and her emotional status. 2. Ao atherosclerosis: Noted during her workup a couple of years ago. She does not have any symptoms of vascular coronary disease, but should try to keep LDL less than 100 mg/dL. Time to evaluate lipid profile. RA: on MTX/plaquenil/biological, but not on prednisone.  Medication Adjustments/Labs and Tests Ordered: Current medicines are reviewed at length with the patient today.  Concerns regarding medicines are outlined above. Labs and tests ordered and medication changes are outlined in the patient instructions below:  There are no Patient Instructions on file for this visit.   Signed, Sanda Klein, MD  09/17/2017 10:35 AM    [place]

## 2017-09-17 NOTE — Patient Instructions (Signed)
Medication Instructions: Dr Sallyanne Kuster has recommended making the following medication changes: 1. INCREASE Amlodipine to 10 mg daily  Your physician has requested that you regularly monitor your blood pressure at home. Please use the same machine to check your blood pressure daily. Keep a record of your blood pressures using the log sheet provided. In 2 weeks, please report your readings back to Dr C. You may use our online patient portal 'MyChart' or you can call the office to speak with a nurse.  Labwork: Your physician recommends that you return for lab work at your earliest [place].  Testing/Procedures: NONE ORDERED  Follow-up: Dr Sallyanne Kuster recommends that you schedule a follow-up appointment in 12 months. You will receive a reminder letter in the mail two months in advance. If you don't receive a letter, please call our office to schedule the follow-up appointment.  If you need a refill on your cardiac medications before your next appointment, please call your pharmacy.    [place] device - [email or web address]

## 2017-09-21 ENCOUNTER — Encounter: Payer: Medicare Other | Admitting: Women's Health

## 2017-09-21 DIAGNOSIS — I700 Atherosclerosis of aorta: Secondary | ICD-10-CM | POA: Diagnosis not present

## 2017-09-21 DIAGNOSIS — I10 Essential (primary) hypertension: Secondary | ICD-10-CM | POA: Diagnosis not present

## 2017-09-22 LAB — COMPREHENSIVE METABOLIC PANEL
ALBUMIN: 4.3 g/dL (ref 3.6–4.8)
ALT: 28 IU/L (ref 0–32)
AST: 30 IU/L (ref 0–40)
Albumin/Globulin Ratio: 1.9 (ref 1.2–2.2)
Alkaline Phosphatase: 53 IU/L (ref 39–117)
BUN / CREAT RATIO: 25 (ref 12–28)
BUN: 20 mg/dL (ref 8–27)
Bilirubin Total: 0.4 mg/dL (ref 0.0–1.2)
CALCIUM: 9.3 mg/dL (ref 8.7–10.3)
CO2: 26 mmol/L (ref 20–29)
CREATININE: 0.79 mg/dL (ref 0.57–1.00)
Chloride: 101 mmol/L (ref 96–106)
GFR calc Af Amer: 89 mL/min/{1.73_m2} (ref >59)
GFR, EST NON AFRICAN AMERICAN: 77 mL/min/{1.73_m2} (ref >59)
GLOBULIN, TOTAL: 2.3 g/dL (ref 1.5–4.5)
Glucose: 89 mg/dL (ref 65–99)
Potassium: 4.4 mmol/L (ref 3.5–5.2)
SODIUM: 142 mmol/L (ref 134–144)
Total Protein: 6.6 g/dL (ref 6.0–8.5)

## 2017-09-22 LAB — LIPID PANEL
CHOLESTEROL TOTAL: 259 mg/dL — AB (ref 100–199)
Chol/HDL Ratio: 2.4 ratio (ref 0.0–4.4)
HDL: 108 mg/dL (ref >39)
LDL Calculated: 142 mg/dL — ABNORMAL HIGH (ref 0–99)
Triglycerides: 45 mg/dL (ref 0–149)
VLDL CHOLESTEROL CAL: 9 mg/dL (ref 5–40)

## 2017-09-26 ENCOUNTER — Telehealth: Payer: Self-pay | Admitting: *Deleted

## 2017-09-26 DIAGNOSIS — M0589 Other rheumatoid arthritis with rheumatoid factor of multiple sites: Secondary | ICD-10-CM | POA: Diagnosis not present

## 2017-09-26 DIAGNOSIS — R110 Nausea: Secondary | ICD-10-CM | POA: Diagnosis not present

## 2017-09-26 DIAGNOSIS — Z79899 Other long term (current) drug therapy: Secondary | ICD-10-CM

## 2017-09-26 DIAGNOSIS — Z6820 Body mass index (BMI) 20.0-20.9, adult: Secondary | ICD-10-CM | POA: Diagnosis not present

## 2017-09-26 DIAGNOSIS — M2550 Pain in unspecified joint: Secondary | ICD-10-CM | POA: Diagnosis not present

## 2017-09-26 MED ORDER — ATORVASTATIN CALCIUM 20 MG PO TABS: 20.0000 mg | ORAL_TABLET | Freq: Every day | ORAL | 3 refills | Status: DC

## 2017-09-26 NOTE — Telephone Encounter (Signed)
Patient made aware of results and verbalized her understanding. Atorvastatin 20 mg daily has been ordered to [facility] per her request. Fasting lab orders have been placed for her to have drawn in three months.

## 2017-09-26 NOTE — Telephone Encounter (Signed)
-----   Message from Sanda Klein, MD sent at 09/23/2017 11:08 AM EST ----- Although HDL is excellent, the total cholesterol and LDL are very high. Recommend a statin to reach LDL<100. Atorvastatin 20 mg daly and recheck lipid panel in 3 months, please.

## 2017-10-02 ENCOUNTER — Encounter: Payer: Medicare Other | Admitting: Women's Health

## 2017-10-23 DIAGNOSIS — Z79899 Other long term (current) drug therapy: Secondary | ICD-10-CM | POA: Diagnosis not present

## 2017-10-23 DIAGNOSIS — M0589 Other rheumatoid arthritis with rheumatoid factor of multiple sites: Secondary | ICD-10-CM | POA: Diagnosis not present

## 2017-10-26 ENCOUNTER — Other Ambulatory Visit: Payer: Self-pay

## 2017-10-26 DIAGNOSIS — Z79890 Hormone replacement therapy (postmenopausal): Secondary | ICD-10-CM

## 2017-10-26 MED ORDER — ESTRADIOL 1 MG PO TABS: 1.0000 mg | ORAL_TABLET | Freq: Every day | ORAL | 0 refills | Status: DC

## 2017-10-26 NOTE — Telephone Encounter (Signed)
Annual exam is scheduled for 11/06/17.

## 2017-11-06 ENCOUNTER — Ambulatory Visit (INDEPENDENT_AMBULATORY_CARE_PROVIDER_SITE_OTHER): Payer: Medicare Other | Admitting: Women's Health

## 2017-11-06 ENCOUNTER — Encounter: Payer: Self-pay | Admitting: Women's Health

## 2017-11-06 VITALS — BP 130/80 | Ht 68.0 in | Wt 131.0 lb

## 2017-11-06 DIAGNOSIS — N898 Other specified noninflammatory disorders of vagina: Secondary | ICD-10-CM

## 2017-11-06 DIAGNOSIS — Z01411 Encounter for gynecological examination (general) (routine) with abnormal findings: Secondary | ICD-10-CM | POA: Diagnosis not present

## 2017-11-06 DIAGNOSIS — N952 Postmenopausal atrophic vaginitis: Secondary | ICD-10-CM | POA: Diagnosis not present

## 2017-11-06 DIAGNOSIS — Z79890 Hormone replacement therapy (postmenopausal): Secondary | ICD-10-CM | POA: Diagnosis not present

## 2017-11-06 LAB — WET PREP FOR TRICH, YEAST, CLUE

## 2017-11-06 MED ORDER — ESTRADIOL 1 MG PO TABS: 1.0000 mg | ORAL_TABLET | Freq: Every day | ORAL | 4 refills | Status: DC

## 2017-11-06 NOTE — Patient Instructions (Signed)
Health Maintenance for Postmenopausal Women Menopause is a normal process in which your reproductive ability comes to an end. This process happens gradually over a span of months to years, usually between the ages of 22 and 9. Menopause is complete when you have missed 12 consecutive menstrual periods. It is important to talk with your health care provider about some of the most common conditions that affect postmenopausal women, such as heart disease, cancer, and bone loss (osteoporosis). Adopting a healthy lifestyle and getting preventive care can help to promote your health and wellness. Those actions can also lower your chances of developing some of these common conditions. What should I know about menopause? During menopause, you may experience a number of symptoms, such as:  Moderate-to-severe hot flashes.  Night sweats.  Decrease in sex drive.  Mood swings.  Headaches.  Tiredness.  Irritability.  Memory problems.  Insomnia.  Choosing to treat or not to treat menopausal changes is an individual decision that you make with your health care provider. What should I know about hormone replacement therapy and supplements? Hormone therapy products are effective for treating symptoms that are associated with menopause, such as hot flashes and night sweats. Hormone replacement carries certain risks, especially as you become older. If you are thinking about using estrogen or estrogen with progestin treatments, discuss the benefits and risks with your health care provider. What should I know about heart disease and stroke? Heart disease, heart attack, and stroke become more likely as you age. This may be due, in part, to the hormonal changes that your body experiences during menopause. These can affect how your body processes dietary fats, triglycerides, and cholesterol. Heart attack and stroke are both medical emergencies. There are many things that you can do to help prevent heart disease  and stroke:  Have your blood pressure checked at least every 1-2 years. High blood pressure causes heart disease and increases the risk of stroke.  If you are 53-22 years old, ask your health care provider if you should take aspirin to prevent a heart attack or a stroke.  Do not use any tobacco products, including cigarettes, chewing tobacco, or electronic cigarettes. If you need help quitting, ask your health care provider.  It is important to eat a healthy diet and maintain a healthy weight. ? Be sure to include plenty of vegetables, fruits, low-fat dairy products, and lean protein. ? Avoid eating foods that are high in solid fats, added sugars, or salt (sodium).  Get regular exercise. This is one of the most important things that you can do for your health. ? Try to exercise for at least 150 minutes each week. The type of exercise that you do should increase your heart rate and make you sweat. This is known as moderate-intensity exercise. ? Try to do strengthening exercises at least twice each week. Do these in addition to the moderate-intensity exercise.  Know your numbers.Ask your health care provider to check your cholesterol and your blood glucose. Continue to have your blood tested as directed by your health care provider.  What should I know about cancer screening? There are several types of cancer. Take the following steps to reduce your risk and to catch any cancer development as early as possible. Breast Cancer  Practice breast self-awareness. ? This means understanding how your breasts normally appear and feel. ? It also means doing regular breast self-exams. Let your health care provider know about any changes, no matter how small.  If you are 40  or older, have a clinician do a breast exam (clinical breast exam or CBE) every year. Depending on your age, family history, and medical history, it may be recommended that you also have a yearly breast X-ray (mammogram).  If you  have a family history of breast cancer, talk with your health care provider about genetic screening.  If you are at high risk for breast cancer, talk with your health care provider about having an MRI and a mammogram every year.  Breast cancer (BRCA) gene test is recommended for women who have family members with BRCA-related cancers. Results of the assessment will determine the need for genetic counseling and BRCA1 and for BRCA2 testing. BRCA-related cancers include these types: ? Breast. This occurs in males or females. ? Ovarian. ? Tubal. This may also be called fallopian tube cancer. ? Cancer of the abdominal or pelvic lining (peritoneal cancer). ? Prostate. ? Pancreatic.  Cervical, Uterine, and Ovarian Cancer Your health care provider may recommend that you be screened regularly for cancer of the pelvic organs. These include your ovaries, uterus, and vagina. This screening involves a pelvic exam, which includes checking for microscopic changes to the surface of your cervix (Pap test).  For women ages 21-65, health care providers may recommend a pelvic exam and a Pap test every three years. For women ages 79-65, they may recommend the Pap test and pelvic exam, combined with testing for human papilloma virus (HPV), every five years. Some types of HPV increase your risk of cervical cancer. Testing for HPV may also be done on women of any age who have unclear Pap test results.  Other health care providers may not recommend any screening for nonpregnant women who are considered low risk for pelvic cancer and have no symptoms. Ask your health care provider if a screening pelvic exam is right for you.  If you have had past treatment for cervical cancer or a condition that could lead to cancer, you need Pap tests and screening for cancer for at least 20 years after your treatment. If Pap tests have been discontinued for you, your risk factors (such as having a new sexual partner) need to be  reassessed to determine if you should start having screenings again. Some women have medical problems that increase the chance of getting cervical cancer. In these cases, your health care provider may recommend that you have screening and Pap tests more often.  If you have a family history of uterine cancer or ovarian cancer, talk with your health care provider about genetic screening.  If you have vaginal bleeding after reaching menopause, tell your health care provider.  There are currently no reliable tests available to screen for ovarian cancer.  Lung Cancer Lung cancer screening is recommended for adults 69-62 years old who are at high risk for lung cancer because of a history of smoking. A yearly low-dose CT scan of the lungs is recommended if you:  Currently smoke.  Have a history of at least 30 pack-years of smoking and you currently smoke or have quit within the past 15 years. A pack-year is smoking an average of one pack of cigarettes per day for one year.  Yearly screening should:  Continue until it has been 15 years since you quit.  Stop if you develop a health problem that would prevent you from having lung cancer treatment.  Colorectal Cancer  This type of cancer can be detected and can often be prevented.  Routine colorectal cancer screening usually begins at  age 42 and continues through age 45.  If you have risk factors for colon cancer, your health care provider may recommend that you be screened at an earlier age.  If you have a family history of colorectal cancer, talk with your health care provider about genetic screening.  Your health care provider may also recommend using home test kits to check for hidden blood in your stool.  A small camera at the end of a tube can be used to examine your colon directly (sigmoidoscopy or colonoscopy). This is done to check for the earliest forms of colorectal cancer.  Direct examination of the colon should be repeated every  5-10 years until age 71. However, if early forms of precancerous polyps or small growths are found or if you have a family history or genetic risk for colorectal cancer, you may need to be screened more often.  Skin Cancer  Check your skin from head to toe regularly.  Monitor any moles. Be sure to tell your health care provider: ? About any new moles or changes in moles, especially if there is a change in a mole's shape or color. ? If you have a mole that is larger than the size of a pencil eraser.  If any of your family members has a history of skin cancer, especially at a Perri Lamagna age, talk with your health care provider about genetic screening.  Always use sunscreen. Apply sunscreen liberally and repeatedly throughout the day.  Whenever you are outside, protect yourself by wearing long sleeves, pants, a wide-brimmed hat, and sunglasses.  What should I know about osteoporosis? Osteoporosis is a condition in which bone destruction happens more quickly than new bone creation. After menopause, you may be at an increased risk for osteoporosis. To help prevent osteoporosis or the bone fractures that can happen because of osteoporosis, the following is recommended:  If you are 46-71 years old, get at least 1,000 mg of calcium and at least 600 mg of vitamin D per day.  If you are older than age 55 but younger than age 65, get at least 1,200 mg of calcium and at least 600 mg of vitamin D per day.  If you are older than age 54, get at least 1,200 mg of calcium and at least 800 mg of vitamin D per day.  Smoking and excessive alcohol intake increase the risk of osteoporosis. Eat foods that are rich in calcium and vitamin D, and do weight-bearing exercises several times each week as directed by your health care provider. What should I know about how menopause affects my mental health? Depression may occur at any age, but it is more common as you become older. Common symptoms of depression  include:  Low or sad mood.  Changes in sleep patterns.  Changes in appetite or eating patterns.  Feeling an overall lack of motivation or enjoyment of activities that you previously enjoyed.  Frequent crying spells.  Talk with your health care provider if you think that you are experiencing depression. What should I know about immunizations? It is important that you get and maintain your immunizations. These include:  Tetanus, diphtheria, and pertussis (Tdap) booster vaccine.  Influenza every year before the flu season begins.  Pneumonia vaccine.  Shingles vaccine.  Your health care provider may also recommend other immunizations. This information is not intended to replace advice given to you by your health care provider. Make sure you discuss any questions you have with your health care provider. Document Released: 11/03/2005  Document Revised: 03/31/2016 Document Reviewed: 06/15/2015 Elsevier Interactive Patient Education  2018 Else[employer] Inc[employer]  

## 2017-11-06 NOTE — Progress Notes (Signed)
Patricia Long Patricia Long Mar 11, 1951 [id number]    History:    Presents for breast and pelvic exam with complaint of mild vaginal irritation with occasional itching. Normal Pap and mammogram history. 1997 TAH with BSO for fibroids on estradiol 1 mg. Unable to tolerate being off HRT or a lower dose. DEXA managed by primary care. 2010 negative colonoscopy. Primary care managing hypercholesterolemia and hypertension. RA on methotrexate. Biggest problem is caring for her husband who is wheelchair bound after a stroke.   Past medical history, past surgical history, family history and social history were all reviewed and documented in the EPIC chart. Retired from [employer].  ROS:  A ROS was performed and pertinent positives and negatives are included.  Exam:  Vitals:   11/06/17 1157  BP: 130/80  Weight: 131 lb (59.4 kg)  Height: 5\' 8"  (1.727 m)   Body mass index is 19.92 kg/m.   General appearance:  Normal Thyroid:  Symmetrical, normal in size, without palpable masses or nodularity. Respiratory  Auscultation:  Clear without wheezing or rhonchi Cardiovascular  Auscultation:  Regular rate, without rubs, murmurs or gallops  Edema/varicosities:  Not grossly evident Abdominal  Soft,nontender, without masses, guarding or rebound.  Liver/spleen:  No organomegaly noted  Hernia:  None appreciated  Skin  Inspection:  Grossly normal   Breasts: Examined lying and sitting.     Right: Without masses, retractions, discharge or axillary adenopathy.     Left: Without masses, retractions, discharge or axillary adenopathy. Gentitourinary   Inguinal/mons:  Normal without inguinal adenopathy  External genitalia:  Normal  BUS/Urethra/Skene's glands:  Normal  Vagina:   atrophic wet prep negative  Cervix:  And uterus absent Adnexa/parametria:     Rt: Without masses or tenderness.   Lt: Without masses or tenderness.  Anus and perineum: Normal  Digital rectal exam: Normal sphincter tone  without palpated masses or tenderness  Assessment/Plan:  67 y.o. M WF G1 P0 for breast and pelvic exam.  1997 TAH with BSO on HRT RA-rheumatologist manages Hypertension, hypercholesteremia-primary care manages labs and meds Situational stress-husband stroke wheelchair-bound  Plan: Estradiol 1 mg by mouth daily prescription, proper use given and reviewed. Encouraged to decrease dose as able, reviewed risks of blood clots, strokes and breast cancer. States unable to tolerate hot flashes, no sleep and moodiness when off. SBE's, annual screening mammogram, overdue instructed to schedule. Home safety, fall prevention and importance of balance type exercise reviewed and encouraged. Continue exercise at [facility]. Reviewed importance of self-care, leisure activities, obtaining help as needed with husband's care.    [place], 1:04 PM 11/06/2017

## 2017-11-18 ENCOUNTER — Encounter: Payer: Self-pay | Admitting: Cardiovascular Disease

## 2017-11-21 DIAGNOSIS — S63417A Traumatic rupture of collateral ligament of left little finger at metacarpophalangeal and interphalangeal joint, initial encounter: Secondary | ICD-10-CM | POA: Diagnosis not present

## 2017-12-18 DIAGNOSIS — M0589 Other rheumatoid arthritis with rheumatoid factor of multiple sites: Secondary | ICD-10-CM | POA: Diagnosis not present

## 2017-12-18 DIAGNOSIS — Z79899 Other long term (current) drug therapy: Secondary | ICD-10-CM | POA: Diagnosis not present

## 2017-12-19 DIAGNOSIS — G5622 Lesion of ulnar nerve, left upper limb: Secondary | ICD-10-CM | POA: Diagnosis not present

## 2017-12-19 DIAGNOSIS — M19042 Primary osteoarthritis, left hand: Secondary | ICD-10-CM | POA: Diagnosis not present

## 2018-01-10 DIAGNOSIS — G5620 Lesion of ulnar nerve, unspecified upper limb: Secondary | ICD-10-CM | POA: Diagnosis not present

## 2018-01-10 DIAGNOSIS — Z13820 Encounter for screening for osteoporosis: Secondary | ICD-10-CM | POA: Diagnosis not present

## 2018-01-10 DIAGNOSIS — Z0000 Encounter for general adult medical examination without abnormal findings: Secondary | ICD-10-CM | POA: Diagnosis not present

## 2018-01-10 DIAGNOSIS — I700 Atherosclerosis of aorta: Secondary | ICD-10-CM | POA: Diagnosis not present

## 2018-01-10 DIAGNOSIS — E785 Hyperlipidemia, unspecified: Secondary | ICD-10-CM | POA: Diagnosis not present

## 2018-01-10 DIAGNOSIS — M0540 Rheumatoid myopathy with rheumatoid arthritis of unspecified site: Secondary | ICD-10-CM | POA: Diagnosis not present

## 2018-01-10 DIAGNOSIS — Z79899 Other long term (current) drug therapy: Secondary | ICD-10-CM | POA: Diagnosis not present

## 2018-01-10 DIAGNOSIS — I10 Essential (primary) hypertension: Secondary | ICD-10-CM | POA: Diagnosis not present

## 2018-01-10 DIAGNOSIS — Z1389 Encounter for screening for other disorder: Secondary | ICD-10-CM | POA: Diagnosis not present

## 2018-01-10 DIAGNOSIS — K219 Gastro-esophageal reflux disease without esophagitis: Secondary | ICD-10-CM | POA: Diagnosis not present

## 2018-01-10 DIAGNOSIS — M8580 Other specified disorders of bone density and structure, unspecified site: Secondary | ICD-10-CM | POA: Diagnosis not present

## 2018-01-15 DIAGNOSIS — Z79899 Other long term (current) drug therapy: Secondary | ICD-10-CM | POA: Diagnosis not present

## 2018-01-15 DIAGNOSIS — I10 Essential (primary) hypertension: Secondary | ICD-10-CM | POA: Diagnosis not present

## 2018-01-15 DIAGNOSIS — M8580 Other specified disorders of bone density and structure, unspecified site: Secondary | ICD-10-CM | POA: Diagnosis not present

## 2018-01-15 DIAGNOSIS — E559 Vitamin D deficiency, unspecified: Secondary | ICD-10-CM | POA: Diagnosis not present

## 2018-01-15 DIAGNOSIS — E785 Hyperlipidemia, unspecified: Secondary | ICD-10-CM | POA: Diagnosis not present

## 2018-01-16 ENCOUNTER — Ambulatory Visit (INDEPENDENT_AMBULATORY_CARE_PROVIDER_SITE_OTHER): Payer: Medicare Other | Admitting: Podiatry

## 2018-01-16 DIAGNOSIS — L600 Ingrowing nail: Secondary | ICD-10-CM

## 2018-01-16 NOTE — Patient Instructions (Addendum)

## 2018-01-17 NOTE — Progress Notes (Signed)
Subjective:   Patient ID: Patricia Long, female   DOB: 67 y.o.   MRN: [id number]   HPI Patient presents with painful ingrown toenail deformity right hallux and states that her ear for 2 weeks and she is tried to trim and soak it without relief she has had a fusion of that big toe joint.  Patient does not smoke and likes to be active   Review of Systems  All other systems reviewed and are negative.       Objective:  Physical Exam  Constitutional: She appears well-developed and well-nourished.  Cardiovascular: Intact distal pulses.  Pulmonary/Chest: Effort normal.  Musculoskeletal: Normal range of motion.  Neurological: She is alert.  Skin: Skin is warm.  Nursing note and vitals reviewed.   Neurovascular status intact muscle strength is adequate range of motion within normal limits with patient noted to have incurvated hallux nail corner with pain no redness or drainage was noted and good digital perfusion was noted.  Patient does have a fused right hallux and this was done a number of years ago with a rheumatoid foot reconstruction     Assessment:  Ingrown toenail deformity right hallux with pain     Plan:  H&P condition discussed at great length.  I do think correction is necessary due to pain she is and I explained the procedure and risks and patient read and signed consent form understanding risk.  Today I infiltrated the right hallux 61 g like Marcaine mixture of sterile prep applied to the toe and was sterilized mutation I remove the border exposed matrix and applied phenol 3 application 30 seconds followed by alcohol about sterile dressing.  Given instructions on soaks and reappoint

## 2018-01-22 DIAGNOSIS — M7062 Trochanteric bursitis, left hip: Secondary | ICD-10-CM | POA: Diagnosis not present

## 2018-01-23 ENCOUNTER — Other Ambulatory Visit: Payer: Self-pay

## 2018-01-23 ENCOUNTER — Encounter (HOSPITAL_BASED_OUTPATIENT_CLINIC_OR_DEPARTMENT_OTHER): Payer: Self-pay | Admitting: *Deleted

## 2018-01-23 ENCOUNTER — Emergency Department (HOSPITAL_BASED_OUTPATIENT_CLINIC_OR_DEPARTMENT_OTHER): Payer: Medicare Other

## 2018-01-23 ENCOUNTER — Emergency Department (HOSPITAL_BASED_OUTPATIENT_CLINIC_OR_DEPARTMENT_OTHER)
Admission: EM | Admit: 2018-01-23 | Discharge: 2018-01-23 | Disposition: A | Discharge status: Home / Self Care | Payer: Medicare Other | Attending: Emergency Medicine | Admitting: Emergency Medicine

## 2018-01-23 DIAGNOSIS — Z79899 Other long term (current) drug therapy: Secondary | ICD-10-CM | POA: Diagnosis not present

## 2018-01-23 DIAGNOSIS — Y9301 Activity, walking, marching and hiking: Secondary | ICD-10-CM | POA: Diagnosis not present

## 2018-01-23 DIAGNOSIS — Y998 Other external cause status: Secondary | ICD-10-CM | POA: Diagnosis not present

## 2018-01-23 DIAGNOSIS — W010XXA Fall on same level from slipping, tripping and stumbling without subsequent striking against object, initial encounter: Secondary | ICD-10-CM | POA: Diagnosis not present

## 2018-01-23 DIAGNOSIS — S0990XA Unspecified injury of head, initial encounter: Secondary | ICD-10-CM | POA: Diagnosis not present

## 2018-01-23 DIAGNOSIS — Y929 Unspecified place or not applicable: Secondary | ICD-10-CM | POA: Diagnosis not present

## 2018-01-23 DIAGNOSIS — R110 Nausea: Secondary | ICD-10-CM | POA: Diagnosis not present

## 2018-01-23 DIAGNOSIS — I10 Essential (primary) hypertension: Secondary | ICD-10-CM | POA: Diagnosis not present

## 2018-01-23 DIAGNOSIS — Z85828 Personal history of other malignant neoplasm of skin: Secondary | ICD-10-CM | POA: Insufficient documentation

## 2018-01-23 DIAGNOSIS — R42 Dizziness and giddiness: Secondary | ICD-10-CM | POA: Insufficient documentation

## 2018-01-23 DIAGNOSIS — S20211A Contusion of right front wall of thorax, initial encounter: Secondary | ICD-10-CM | POA: Diagnosis not present

## 2018-01-23 DIAGNOSIS — R0781 Pleurodynia: Secondary | ICD-10-CM | POA: Diagnosis not present

## 2018-01-23 DIAGNOSIS — S20219A Contusion of unspecified front wall of thorax, initial encounter: Secondary | ICD-10-CM | POA: Diagnosis not present

## 2018-01-23 DIAGNOSIS — W19XXXA Unspecified fall, initial encounter: Secondary | ICD-10-CM

## 2018-01-23 DIAGNOSIS — S299XXA Unspecified injury of thorax, initial encounter: Secondary | ICD-10-CM | POA: Diagnosis not present

## 2018-01-23 NOTE — ED Provider Notes (Signed)
[facility] EMERGENCY DEPARTMENT Provider Note   CSN: [id number] Arrival date & time: 01/23/18  0913     History   Chief Complaint Chief Complaint  Patient presents with  . Fall    HPI Patricia Long is a 67 y.o. female.  Pt presents to the ED today with fall around 1800 yesterday.  Pt said her husband walks with a walker.  He slipped and she was trying to catch him.  She ended up falling and hitting her right ribs and her left head.  She did not have a loc, but was initially dazed.  She said it hurts to take a deep breath.  She has felt nauseous since then.  Still feels a little dizzy.     Past Medical History:  Diagnosis Date  . Basal cell carcinoma ~ 2013   "legs"  . Chronic bronchitis ([place])    "I've had it several times but not q yr" (01/13/2015)  . Endometriosis   . Fibroid   . Irritable bowel syndrome   . Pneumonia 1970's X 1; ~ 2013  . PONV (postoperative nausea and vomiting)   . Rheumatoid arthritis(714.0)     Patient Active Problem List   Diagnosis Date Noted  . Neck pain on right side 12/28/2015  . Postconcussion syndrome 10/25/2015  . Aortic arch atherosclerosis ([place]) 03/08/2015  . Hypertension 01/13/2015  . Chest pain 01/13/2015  . Labile hypertension 01/13/2015  . Rheumatoid arthritis ([place]) 08/29/2013  . ABDOMINAL PAIN, EPIGASTRIC 08/02/2009  . GERD 07/30/2009  . CONSTIPATION 07/30/2009  . IRRITABLE BOWEL SYNDROME 07/30/2009  . NAUSEA AND VOMITING 07/30/2009  . Nausea without vomiting 07/30/2009  . ABDOMINAL PAIN, GENERALIZED 07/30/2009    Past Surgical History:  Procedure Laterality Date  . BASAL CELL CARCINOMA EXCISION  ~ 2013   "legs"  . BREAST CYST EXCISION Bilateral 1992   NODULE EXCISION OF RIGHT AND LEFT BREAST  . FOOT SURGERY Bilateral ~ 2006-2008   "reconstruction"  . JOINT REPLACEMENT Left 2009   WRIST AND FINGER   . KNEE ARTHROSCOPY Right 1980's  . PELVIC LAPAROSCOPY  '82 AND '87  . TOTAL ABDOMINAL HYSTERECTOMY   1997   TAH/BSO     OB History    Gravida  1   Para  0   Term      Preterm      AB  1   Living  0     SAB  1   TAB      Ectopic      Multiple      Live Births               Home Medications    Prior to Admission medications   Medication Sig Start Date End Date Taking? Authorizing Provider  amLODipine (NORVASC) 10 MG tablet Take 1 tablet (10 mg total) by mouth daily. 09/17/17 09/12/18  Croitoru, Mihai, MD  atorvastatin (LIPITOR) 20 MG tablet Take 1 tablet (20 mg total) by mouth daily. 09/26/17 12/25/17  Croitoru, Mihai, MD  cetirizine (ZYRTEC) 10 MG tablet Take 10 mg by mouth daily as needed (remicaide infusion).    [provider]  estradiol (ESTRACE) 1 MG tablet Take 1 tablet (1 mg total) by mouth daily. 11/06/17   Huel Cote, NP  folic acid (FOLVITE) 1 MG tablet Take 1 mg by mouth daily.    [provider]  hydroxychloroquine (PLAQUENIL) 200 MG tablet Take 200 mg by mouth daily.    [provider]  inFLIXimab (REMICADE) 100 MG injection Inject 100 mg into the vein every 8 (eight) weeks.    [provider]  Methotrexate, Anti-Rheumatic, (METHOTREXATE, PF, [place]) Inject 20 mg into the skin once a week.    [provider]  Multiple Vitamin (MULTIVITAMIN) tablet Take 1 tablet by mouth daily.    [provider]    Family History Family History  Problem Relation Age of Onset  . Heart disease Mother   . Heart disease Father   . Colon cancer Maternal Aunt   . Colon cancer Paternal Uncle   . Colon polyps Neg Hx   . Diabetes Neg Hx   . Kidney disease Neg Hx   . Esophageal cancer Neg Hx     Social History Social History   Tobacco Use  . Smoking status: Never Smoker  . Smokeless tobacco: Never Used  Substance Use Topics  . Alcohol use: Yes    Alcohol/week: 1.2 oz    Types: 1 Glasses of wine, 1 Standard drinks or equivalent per week  . Drug use: No     Allergies   Prochlorperazine edisylate;  Prochlorperazine edisylate; Codeine; Prochlorperazine; and Tape   Review of Systems Review of Systems  Gastrointestinal: Positive for nausea.  Musculoskeletal:       Right rib pain  Neurological: Positive for dizziness.  All other systems reviewed and are negative.    Physical Exam Updated Vital Signs BP (!) 148/69 (BP Location: Left Arm)   Pulse 64   Temp 97.8 F (36.6 C) (Oral)   Resp 18   Ht 5\' 8"  (1.727 m)   Wt 59 kg (130 lb)   SpO2 100%   BMI 19.77 kg/m   Physical Exam  Constitutional: She is oriented to person, place, and time. She appears well-developed and well-nourished.  HENT:  Head: Normocephalic.    Right Ear: External ear normal.  Left Ear: External ear normal.  Nose: Nose normal.  Mouth/Throat: Oropharynx is clear and moist.  Eyes: Pupils are equal, round, and reactive to light. Conjunctivae and EOM are normal.  Neck: Normal range of motion. Neck supple.  Cardiovascular: Normal rate, regular rhythm, normal heart sounds and intact distal pulses.  Pulmonary/Chest: Effort normal and breath sounds normal.    Abdominal: Soft. Bowel sounds are normal.  Musculoskeletal: Normal range of motion.  Neurological: She is alert and oriented to person, place, and time.  Skin: Skin is warm. Capillary refill takes less than 2 seconds.  Psychiatric: She has a normal mood and affect. Her behavior is normal. Judgment and thought content normal.  Nursing note and vitals reviewed.    ED Treatments / Results  Labs (all labs ordered are listed, but only abnormal results are displayed) Labs Reviewed - No data to display  EKG None  Radiology Dg Ribs Unilateral W/chest Right  Result Date: 01/23/2018 CLINICAL DATA:  Right rib pain after fall last night. EXAM: RIGHT RIBS AND CHEST - 3+ VIEW COMPARISON:  Radiographs of January 13, 2015. FINDINGS: No fracture or other bone lesions are seen involving the ribs. There is no evidence of pneumothorax or pleural effusion. Both  lungs are clear. Heart size and mediastinal contours are within normal limits. IMPRESSION: Normal right ribs.  No acute cardiopulmonary abnormality seen. Electronically Signed   By: Marijo Conception, M.D.   On: 01/23/2018 10:17   Ct Head Wo Contrast  Result Date: 01/23/2018 CLINICAL DATA:  Fall, hit head.  Headache, dizziness EXAM: CT HEAD WITHOUT CONTRAST TECHNIQUE: Contiguous axial images  were obtained from the base of the skull through the vertex without intravenous contrast. COMPARISON:  None. FINDINGS: Brain: Mild age related volume loss. No acute intracranial abnormality. Specifically, no hemorrhage, hydrocephalus, mass lesion, acute infarction, or significant intracranial injury. Vascular: No hyperdense vessel or unexpected calcification. Skull: No acute calvarial abnormality. Sinuses/Orbits: Visualized paranasal sinuses and mastoids clear. Orbital soft tissues unremarkable. Other: None IMPRESSION: No acute intracranial abnormality. Electronically Signed   By: Rolm Baptise M.D.   On: 01/23/2018 10:07    Procedures Procedures (including critical care time)  Medications Ordered in ED Medications - No data to display   Initial Impression / Assessment and Plan / ED Course  I have reviewed the triage vital signs and the nursing notes.  Pertinent labs & imaging results that were available during my care of the patient were reviewed by me and considered in my medical decision making (see chart for details).     Pt did not want anything for pain or for nausea here or at home.  She is given an incentive spirometer for home.   She is given head injury precautions.  She knows to return if worse and to f/u with pcp.  Final Clinical Impressions(s) / ED Diagnoses   Final diagnoses:  Fall, initial encounter  Rib contusion, right, initial encounter  Minor head injury, initial encounter    ED Discharge Orders    None       Isla Pence, MD 01/23/18 1039

## 2018-01-23 NOTE — ED Triage Notes (Signed)
Pt fell last night about 6pm. She hit her head on the corner of the shower, and hit her entire right side on her husbands walker. She has been sore, dizzy, blurred vision, nausea, and a headache every since.

## 2018-01-23 NOTE — ED Notes (Signed)
NAD at this time. Pt is stable and going home.  

## 2018-01-24 DIAGNOSIS — Z681 Body mass index (BMI) 19 or less, adult: Secondary | ICD-10-CM | POA: Diagnosis not present

## 2018-01-24 DIAGNOSIS — Z79899 Other long term (current) drug therapy: Secondary | ICD-10-CM | POA: Diagnosis not present

## 2018-01-24 DIAGNOSIS — M0589 Other rheumatoid arthritis with rheumatoid factor of multiple sites: Secondary | ICD-10-CM | POA: Diagnosis not present

## 2018-01-24 DIAGNOSIS — M2550 Pain in unspecified joint: Secondary | ICD-10-CM | POA: Diagnosis not present

## 2018-01-30 ENCOUNTER — Telehealth: Payer: Self-pay | Admitting: Neurology

## 2018-01-30 NOTE — Telephone Encounter (Signed)
Patient fell and hit her head and [facility] [address] [facility]. They told her that she had a concussion and she is worried that because she is still having really  Bad headaches and wants to speak with someone

## 2018-01-31 ENCOUNTER — Telehealth: Payer: Self-pay | Admitting: *Deleted

## 2018-01-31 NOTE — Telephone Encounter (Signed)
Kana can you help with this please.

## 2018-01-31 NOTE — Telephone Encounter (Signed)
Copied from [place] [id number]. Topic: Appointment Scheduling - Scheduling Inquiry for Clinic >> Jan 31, 2018  9:57 AM Scherrie Gerlach wrote: Reason for CRM: pt states she fell 4/30 trying to hold her husband up and hit her head. Pt went to med center and dx with concussion.  Pt still having issues, cannot sleep, blurred vision, nausea, (only at night and first thing in the am) Dr Delice Lesch office advised her to contact Dr Tamala Julian, stating he is good with concussions. Dr Delice Lesch office also gave her concussion hot line, but has not gotten a call back from then. Was hoping to be seen asap.  First available new pt is not until 02/25/18. Please call back if able to see sooner. Pt aware Dr Tamala Julian is out of the office this week.

## 2018-02-01 NOTE — Telephone Encounter (Signed)
Scheduled patient 5/14 at 2:30 for Dr. Tamala Julian

## 2018-02-04 ENCOUNTER — Telehealth: Payer: Self-pay

## 2018-02-04 NOTE — Progress Notes (Signed)
Subjective:   I, Patricia Long, am serving as a scribe for Dr. Hulan Saas.  Chief Complaint: Patricia Long, DOB: 01/15/1951, is a 67 y.o. female who presents for head injury sustained on 01/22/18 when she fell and hit her head on her husband's walker and on the floor and possibly on the side of a glass shower door. No LOC. Has had a headache since accident as well as nausea and dizziness. Patient has history of two prior concussions from [facility] with the most recent concussion occurring 2 years ago. Patient drove to the appointment and did not feel that her symptoms increased.   Injury date : 01/22/18 Visit #: 1  Previous imagine.   History of Present Illness:    Concussion Self-Reported Symptom Score Symptoms rated on a scale 1-6, in last 24 hours  Headache: 4   Nausea: 2  Vomiting: 0 Balance difficulty: 0  Dizziness: 1  Fatigue: 4  Trouble Falling Asleep: 0   Sleep More Than Usual: 0  Sleep Less Than Usual: 4  Daytime Drowsiness:0  Photophobia:0  Phonophobia: 0  Irritability: 2  Sadness:0  Nervousness: 0  Feeling More Emotional: 0  Numbness or Tingling: 0  Feeling Slowed Down: 0  Feeling Mentally Foggy: 0  Difficulty Concentrating: 0  Difficulty Remembering: 0  Visual Problems: 2     Total Symptom Score: 20   Review of Systems: Pertinent items are noted in HPI.  Review of History: Past Medical History:  Past Medical History:  Diagnosis Date  . Basal cell carcinoma ~ 2013   "legs"  . Chronic bronchitis ([place])    "I've had it several times but not q yr" (01/13/2015)  . Endometriosis   . Fibroid   . Irritable bowel syndrome   . Pneumonia 1970's X 1; ~ 2013  . PONV (postoperative nausea and vomiting)   . Rheumatoid arthritis(714.0)     Past Surgical History:  has a past surgical history that includes Total abdominal hysterectomy (1997); Pelvic laparoscopy ('82 AND '87); Foot surgery (Bilateral, ~ 2006-2008); Joint replacement (Left, 2009); Knee arthroscopy  (Right, 1980's); Breast cyst excision (Bilateral, 1992); and Excision basal cell carcinoma (~ 2013). Family History: family history includes Colon cancer in her maternal aunt and paternal uncle; Heart disease in her father and mother. Social History:  reports that she has never smoked. She has never used smokeless tobacco. She reports that she drinks about 1.2 oz of alcohol per week. She reports that she does not use drugs. Current Medications: has a current medication list which includes the following prescription(s): amlodipine, cetirizine, estradiol, folic acid, hydroxychloroquine, infliximab, methotrexate (anti-rheumatic), multivitamin, and atorvastatin. Allergies: is allergic to prochlorperazine edisylate; prochlorperazine edisylate; codeine; prochlorperazine; and tape.  Objective:    Physical Examination Vitals:   02/05/18 1422  BP: 122/82  Pulse: 63   General appearance: alert, appears stated age and cooperative Head: Normocephalic, without obvious abnormality, atraumatic Eyes: conjunctivae/corneas clear. PERRL, EOM's intact. Fundi benign. Sclera anicteric.  Very mild nystagmus no noted. Lungs: clear to auscultation bilaterally and percussion Heart: regular rate and rhythm, S1, S2 normal, no murmur, click, rub or gallop Neurologic: CN 2-12 normal.  Sensation to pain, touch, and proprioception normal.  DTRs  normal in upper and lower extremities. No pathologic reflexes. Neg rhomberg, modified rhomberg, pronator drift, tandem gait, finger-to-nose; see post-concussion vestibular and oculomotor testing in chart Psychiatric: Oriented X3, intact recent and remote memory, judgement and insight, normal mood and affect  Concussion testing performed today:   Assessment:  Cervical spine pain - Plan: DG Cervical Spine With Flex & Extend  Concussion without loss of consciousness, initial encounter - Plan: Ambulatory referral to Physical Therapy  Patricia Long presents with the  following concussion subtypes. [] Cognitive [] Cervical [x] Vestibular [] Ocular [] Migraine [] Anxiety/Mood   Plan:   Action/Discussion: Reviewed diagnosis, management options, expected outcomes, and the reasons for scheduled and emergent follow-up. Questions were adequately answered. Patient expressed verbal understanding and agreement with the following plan.     Patient Education:  Reviewed with patient the risks (i.e, a repeat concussion, post-concussion syndrome, second-impact syndrome) of returning to play prior to complete resolution, and thoroughly reviewed the signs and symptoms of concussion.Reviewed need for complete resolution of all symptoms, with rest AND exertion, prior to return to play.  Reviewed red flags for urgent medical evaluation: worsening symptoms, nausea/vomiting, intractable headache, musculoskeletal changes, focal neurological deficits.  [facility] Concussion Care Plan, which clearly outlines the plans stated above, was given to patient.  I was personally involved with the physical evaluation of and am in agreement with the assessment and treatment plan for this patient.  Greater than 50% of this encounter was spent in direct consultation with the patient in evaluation, counseling, and coordination of care. Duration of encounter:55 minutes.  After Visit Summary printed out and provided to patient as appropriate.

## 2018-02-04 NOTE — Telephone Encounter (Signed)
Talked to patient on 01/31/18 about a concussion she sustained on 01/22/18. Scheduled patient to be seen on 02/05/18 at 2:30.

## 2018-02-05 ENCOUNTER — Ambulatory Visit (INDEPENDENT_AMBULATORY_CARE_PROVIDER_SITE_OTHER): Payer: Medicare Other | Admitting: Family Medicine

## 2018-02-05 ENCOUNTER — Encounter: Payer: Self-pay | Admitting: Family Medicine

## 2018-02-05 ENCOUNTER — Ambulatory Visit (INDEPENDENT_AMBULATORY_CARE_PROVIDER_SITE_OTHER)
Admission: RE | Admit: 2018-02-05 | Discharge: 2018-02-05 | Disposition: A | Discharge status: Home / Self Care | Payer: Medicare Other | Source: Ambulatory Visit | Attending: Family Medicine | Admitting: Family Medicine

## 2018-02-05 VITALS — BP 122/82 | HR 63 | Ht 68.0 in

## 2018-02-05 DIAGNOSIS — S060X0A Concussion without loss of consciousness, initial encounter: Secondary | ICD-10-CM

## 2018-02-05 DIAGNOSIS — M542 Cervicalgia: Secondary | ICD-10-CM

## 2018-02-05 DIAGNOSIS — F0781 Postconcussional syndrome: Secondary | ICD-10-CM

## 2018-02-05 DIAGNOSIS — S199XXA Unspecified injury of neck, initial encounter: Secondary | ICD-10-CM | POA: Diagnosis not present

## 2018-02-05 NOTE — Assessment & Plan Note (Signed)
I do believe the patient does have some postconcussive syndrome.  Discussed which activities to do.  Patient discussed over-the-counter medications.  We discussed other type of resting aspects and trying not to exert self.  I do believe that with patient's history of rheumatoid arthritis we do want to check as long as there is no significant neck discomfort that could be contributing and x-rays ordered today.  Follow-up again in 2 weeks

## 2018-02-05 NOTE — Patient Instructions (Signed)
Good to see you.  I do you think you have a mild concussion  Fish oil 3 grams daily for 10 days then 2 grams daily thereafter Vitamin D 2000IU-4000IU daily  Tart cherry extract in pill form any dose at night Try to avoid pushing him too much for the next week.  Stay well hydrated  PT will be calling you  See [place] again in 2 weeks to make sure you are doing better

## 2018-02-12 DIAGNOSIS — Z79899 Other long term (current) drug therapy: Secondary | ICD-10-CM | POA: Diagnosis not present

## 2018-02-12 DIAGNOSIS — M0589 Other rheumatoid arthritis with rheumatoid factor of multiple sites: Secondary | ICD-10-CM | POA: Diagnosis not present

## 2018-02-19 NOTE — Progress Notes (Signed)
Subjective:   I, Jacqualin Combes, am serving as a scribe for Dr. Hulan Saas. Chief Complaint: Patricia Long, DOB: September 07, 1951, is a 67 y.o. female who presents for head injury. Patient does feel that she is getting better. She complains of blurred vision and intermittent headaches. She does note that when she wakes up at night and in the morning she has headache and nausea. Does have headaches with brighter lights. Patient has started supplements that were recommended.    Injury date : 01/22/18  Previous imagine.   History of Present Illness:    Concussion Self-Reported Symptom Score Symptoms rated on a scale 1-6, in last 24 hours  Headache: 0    Nausea: 0  Vomiting: 0  Balance Difficulty: 0  Dizziness: 0  Fatigue: 0  Trouble Falling Asleep: 0  Sleep More Than Usual: 0  Sleep Less Than Usual:2  Daytime Drowsiness: 0  Photophobia: 2  Phonophobia: 0  Irritability:0  Sadness: 0  Nervousness: 0  Feeling More Emotional: 0  Numbness or Tingling: 0  Feeling Slowed Down: 1  Feeling Mentally Foggy:0  Difficulty Concentrating: 0  Difficulty Remembering: 1  Visual Problems: 0    Total Symptom Score:7 Previous Symptom Score:20  Review of Systems: Pertinent items are noted in HPI.  Review of History: Past Medical History:  Past Medical History:  Diagnosis Date  . Basal cell carcinoma ~ 2013   "legs"  . Chronic bronchitis ([place])    "I've had it several times but not q yr" (01/13/2015)  . Endometriosis   . Fibroid   . Irritable bowel syndrome   . Pneumonia 1970's X 1; ~ 2013  . PONV (postoperative nausea and vomiting)   . Rheumatoid arthritis(714.0)     Past Surgical History:  has a past surgical history that includes Total abdominal hysterectomy (1997); Pelvic laparoscopy ('82 AND '87); Foot surgery (Bilateral, ~ 2006-2008); Joint replacement (Left, 2009); Knee arthroscopy (Right, 1980's); Breast cyst excision (Bilateral, 1992); and Excision basal cell carcinoma (~  2013). Family History: family history includes Colon cancer in her maternal aunt and paternal uncle; Heart disease in her father and mother. Social History:  reports that she has never smoked. She has never used smokeless tobacco. She reports that she drinks about 1.2 oz of alcohol per week. She reports that she does not use drugs. Current Medications: has a current medication list which includes the following prescription(s): amlodipine, cetirizine, estradiol, folic acid, hydroxychloroquine, infliximab, methotrexate (anti-rheumatic), and multivitamin. Allergies: is allergic to prochlorperazine edisylate; prochlorperazine edisylate; codeine; prochlorperazine; and tape.  Objective:    Physical Examination Vitals:   02/20/18 1406  BP: 110/62  Pulse: 61  SpO2: 98%   General appearance: alert, appears stated age and cooperative Head: Normocephalic, without obvious abnormality, atraumatic Eyes: conjunctivae/corneas clear. PERRL, EOM's intact. Fundi benign. Sclera anicteric. Lungs: clear to auscultation bilaterally and percussion Heart: regular rate and rhythm, S1, S2 normal, no murmur, click, rub or gallop Neurologic: CN 2-12 normal.  Sensation to pain, touch, and proprioception normal.  DTRs  normal in upper and lower extremities. No pathologic reflexes. Neg rhomberg, modified rhomberg, pronator drift, tandem gait, finger-to-nose; see post-concussion vestibular and oculomotor testing in chart Psychiatric: Oriented X3, intact recent and remote memory, judgement and insight, normal mood and affect       Assessment:     Patricia Long presents with the following concussion subtypes. [] Cognitive [] Cervical [x] Vestibular [] Ocular [] Migraine [] Anxiety/Mood   Plan:   Action/Discussion: Reviewed diagnosis, management options, expected outcomes, and the reasons  for scheduled and emergent follow-up. Questions were adequately answered. Patient expressed verbal understanding and  agreement with the following plan.     Patient Education:  Reviewed with patient the risks (i.e, a repeat concussion, post-concussion syndrome, second-impact syndrome) of returning to play prior to complete resolution, and thoroughly reviewed the signs and symptoms of concussion.Reviewed need for complete resolution of all symptoms, with rest AND exertion, prior to return to play.  Reviewed red flags for urgent medical evaluation: worsening symptoms, nausea/vomiting, intractable headache, musculoskeletal changes, focal neurological deficits.  [facility] Concussion Care Plan, which clearly outlines the plans stated above, was given to patient.  After Visit Summary printed out and provided to patient as appropriate.

## 2018-02-20 ENCOUNTER — Ambulatory Visit (INDEPENDENT_AMBULATORY_CARE_PROVIDER_SITE_OTHER): Payer: Medicare Other | Admitting: Family Medicine

## 2018-02-20 ENCOUNTER — Encounter: Payer: Self-pay | Admitting: Family Medicine

## 2018-02-20 DIAGNOSIS — F0781 Postconcussional syndrome: Secondary | ICD-10-CM

## 2018-02-20 NOTE — Patient Instructions (Signed)
Good to see you  Keep extra vitamin D until headache resolve then back to 2000  IU daily  Tart cherry until bottle is gone.  Fish oil 2 grams daily for 1 week then 1 gram daily thereafter See me again in 2 weeks if not resolved.

## 2018-02-20 NOTE — Assessment & Plan Note (Signed)
Exam unremarkable.  Discussed icing regimen, OTC meds, if worsening symptoms.  If headaches persist discussed further workup  RTC in 2 weeks if symptoms do not resolve.

## 2018-02-25 ENCOUNTER — Ambulatory Visit: Payer: Medicare Other | Admitting: Family Medicine

## 2018-03-06 ENCOUNTER — Ambulatory Visit: Payer: Medicare Other | Admitting: Family Medicine

## 2018-03-18 ENCOUNTER — Ambulatory Visit: Payer: Medicare Other | Attending: Family Medicine | Admitting: Physical Therapy

## 2018-03-18 ENCOUNTER — Encounter: Payer: Self-pay | Admitting: Physical Therapy

## 2018-03-18 DIAGNOSIS — R2681 Unsteadiness on feet: Secondary | ICD-10-CM | POA: Diagnosis not present

## 2018-03-18 DIAGNOSIS — R42 Dizziness and giddiness: Secondary | ICD-10-CM | POA: Diagnosis not present

## 2018-03-18 NOTE — Patient Instructions (Addendum)
Gaze Stabilization: Standing Feet Apart (Compliant Surface)    Feet apart on pillow, keeping eyes on target on wall __6__ feet away, tilt head down 15-30 and move head side to side for _60___ seconds. Repeat while moving head up and down for _60___ seconds. Do _3-5___ sessions per day. Repeat using target on pattern background.  Copyright  VHI. All rights reserved.  Gaze Stabilization: Tip Card  1.Target must remain in focus, not blurry, and appear stationary while head is in motion. 2.Perform exercises with small head movements (45 to either side of midline). 3.Increase speed of head motion so long as target is in focus. 4.If you wear eyeglasses, be sure you can see target through lens (therapist will give specific instructions for bifocal / progressive lenses). 5.These exercises may provoke dizziness or nausea. Work through these symptoms. If too dizzy, slow head movement slightly. Rest between each exercise. 6.Exercises demand concentration; avoid distractions. 7.For safety, perform standing exercises close to a counter, wall, corner, or next to someone.  Copyright  VHI. All rights reserved.

## 2018-03-19 ENCOUNTER — Other Ambulatory Visit: Payer: Self-pay

## 2018-03-19 ENCOUNTER — Encounter: Payer: Self-pay | Admitting: Physical Therapy

## 2018-03-19 NOTE — Therapy (Signed)
[facility] [address] [place], [place], [address] Phone: [phone]   Fax:  [phone]  Physical Therapy Evaluation  Patient Details  Name: Patricia Long MRN: [id number] Date of Birth: [birth year] Referring Provider: Hulan Saas, DO   Encounter Date: 03/18/2018  PT End of Session - 03/19/18 1900    Visit Number  1    Number of Visits  1    Authorization Type  Medicare and BCBS    Authorization Time Period  03-18-18 - 04-17-18    PT Start Time  1117    PT Stop Time  1205    PT Time Calculation (min)  48 min       Past Medical History:  Diagnosis Date  . Basal cell carcinoma ~ 2013   "legs"  . Chronic bronchitis ([place])    "I've had it several times but not q yr" (01/13/2015)  . Endometriosis   . Fibroid   . Irritable bowel syndrome   . Pneumonia 1970's X 1; ~ 2013  . PONV (postoperative nausea and vomiting)   . Rheumatoid arthritis(714.0)     Past Surgical History:  Procedure Laterality Date  . BASAL CELL CARCINOMA EXCISION  ~ 2013   "legs"  . BREAST CYST EXCISION Bilateral 1992   NODULE EXCISION OF RIGHT AND LEFT BREAST  . FOOT SURGERY Bilateral ~ 2006-2008   "reconstruction"  . JOINT REPLACEMENT Left 2009   WRIST AND FINGER   . KNEE ARTHROSCOPY Right 1980's  . PELVIC LAPAROSCOPY  '82 AND '87  . TOTAL ABDOMINAL HYSTERECTOMY  1997   TAH/BSO    There were no vitals filed for this visit.   Subjective Assessment - 03/19/18 1647    Subjective  Pt reports symptoms have significantly improved since fall sustained on 01-22-18; reports she had headaches but they have improved since time of onset    Patient Stated Goals  improve balance - assess status in terms of vestibular function    Currently in Pain?  No/denies         [facility] PT Assessment - 03/19/18 0001      Assessment   Medical Diagnosis  Dizziness:  post concussion     Referring Provider  Hulan Saas, DO    Onset Date/Surgical Date   01/22/18      Precautions   Precautions  None      Balance Screen   Has the patient fallen in the past 6 months  No    Has the patient had a decrease in activity level because of a fear of falling?   No    Is the patient reluctant to leave their home because of a fear of falling?   No      Prior Function   Level of Independence  Independent           Vestibular Assessment - 03/19/18 0001      Vestibular Assessment   General Observation  Pt is a 67 yr old lady s/p concussion after sustaining a fall while assisting her husband in the bathroom in their home.  Pt states dizziness and headaches have signficantly resolved since onset; pt reports much improvement in past 2 weeks.        Symptom Behavior   Type of Dizziness  Blurred vision    Frequency of Dizziness  very infrequent at this time     Duration of Dizziness  varies    Aggravating Factors  No known aggravating factors  Relieving Factors  Rest      Occulomotor Exam   Occulomotor Alignment  Normal    Spontaneous  Absent    Smooth Pursuits  Intact    Saccades  Intact      Visual Acuity   Static  line 9    Dynamic  2.5 line difference       Engineer, drilling organization test          Objective measurements completed on examination: See above findings.        Sensory Organization Test - composite score 68/100 (WNL's)  Somatosensory input - WNL's Visual - decreased at 68/100 with N=80/100 Vestibular - WNL's      Pt instructed in gaze stabilization exercise for HEP  PT Education - 03/19/18 1921    Education Details  x1 viewing in standing    Person(s) Educated  Patient    Methods  Explanation;Demonstration;Handout    Comprehension  Verbalized understanding;Returned demonstration                  Plan - 03/19/18 1905    Clinical Impression Statement  Pt is a 67 yr old lady s/p concussion due to fall sustained on 01-22-18.  Pt reports the dizziness and headaches  have mostly resolved since onset, with much improvement made within past 2 weeks.  Pt states she does not feel that she needs PT at this time but wanted to receive the evaluation to determine her status /deficits.      History and Personal Factors relevant to plan of care:  concussion due to fall on 01-22-18    Clinical Presentation  Stable    Clinical Presentation due to:  concussion on 01-22-18    Clinical Decision Making  Low    Rehab Potential  -- N/A - eval only    PT Frequency  One time visit    PT Treatment/Interventions  Patient/family education;ADLs/Self Care Home Management    PT Next Visit Plan  N/A    PT Home Exercise Plan  x1 viewing    Consulted and Agree with Plan of Care  Patient       Patient will benefit from skilled therapeutic intervention in order to improve the following deficits and impairments:  Decreased balance, Dizziness  Visit Diagnosis: Dizziness and giddiness - Plan: PT plan of care cert/re-cert  Unsteadiness on feet - Plan: PT plan of care cert/re-cert     Problem List Patient Active Problem List   Diagnosis Date Noted  . Neck pain on right side 12/28/2015  . Postconcussion syndrome 10/25/2015  . Aortic arch atherosclerosis ([place]) 03/08/2015  . Hypertension 01/13/2015  . Chest pain 01/13/2015  . Labile hypertension 01/13/2015  . Rheumatoid arthritis ([place]) 08/29/2013  . ABDOMINAL PAIN, EPIGASTRIC 08/02/2009  . GERD 07/30/2009  . CONSTIPATION 07/30/2009  . IRRITABLE BOWEL SYNDROME 07/30/2009  . NAUSEA AND VOMITING 07/30/2009  . Nausea without vomiting 07/30/2009  . ABDOMINAL PAIN, GENERALIZED 07/30/2009    Alda Lea, PT 03/19/2018, 7:21 PM  [facility] [address] [place], [place], [address] Phone: [phone]   Fax:  [phone]  Name: Patricia Long MRN: [id number] Date of Birth: Oct 12, 1950

## 2018-04-09 DIAGNOSIS — M0589 Other rheumatoid arthritis with rheumatoid factor of multiple sites: Secondary | ICD-10-CM | POA: Diagnosis not present

## 2018-04-09 DIAGNOSIS — Z79899 Other long term (current) drug therapy: Secondary | ICD-10-CM | POA: Diagnosis not present

## 2018-06-04 DIAGNOSIS — M0589 Other rheumatoid arthritis with rheumatoid factor of multiple sites: Secondary | ICD-10-CM | POA: Diagnosis not present

## 2018-06-04 DIAGNOSIS — Z79899 Other long term (current) drug therapy: Secondary | ICD-10-CM | POA: Diagnosis not present

## 2018-06-10 DIAGNOSIS — H2513 Age-related nuclear cataract, bilateral: Secondary | ICD-10-CM | POA: Diagnosis not present

## 2018-06-10 DIAGNOSIS — H524 Presbyopia: Secondary | ICD-10-CM | POA: Diagnosis not present

## 2018-06-10 DIAGNOSIS — H52203 Unspecified astigmatism, bilateral: Secondary | ICD-10-CM | POA: Diagnosis not present

## 2018-06-10 DIAGNOSIS — H5211 Myopia, right eye: Secondary | ICD-10-CM | POA: Diagnosis not present

## 2018-06-10 DIAGNOSIS — M0689 Other specified rheumatoid arthritis, multiple sites: Secondary | ICD-10-CM | POA: Diagnosis not present

## 2018-06-10 DIAGNOSIS — Z79899 Other long term (current) drug therapy: Secondary | ICD-10-CM | POA: Diagnosis not present

## 2018-06-10 DIAGNOSIS — H25013 Cortical age-related cataract, bilateral: Secondary | ICD-10-CM | POA: Diagnosis not present

## 2018-06-11 DIAGNOSIS — M7062 Trochanteric bursitis, left hip: Secondary | ICD-10-CM | POA: Diagnosis not present

## 2018-06-28 DIAGNOSIS — I10 Essential (primary) hypertension: Secondary | ICD-10-CM | POA: Diagnosis not present

## 2018-06-28 DIAGNOSIS — E785 Hyperlipidemia, unspecified: Secondary | ICD-10-CM | POA: Diagnosis not present

## 2018-06-28 DIAGNOSIS — M0540 Rheumatoid myopathy with rheumatoid arthritis of unspecified site: Secondary | ICD-10-CM | POA: Diagnosis not present

## 2018-07-01 DIAGNOSIS — Z1231 Encounter for screening mammogram for malignant neoplasm of breast: Secondary | ICD-10-CM | POA: Diagnosis not present

## 2018-07-23 DIAGNOSIS — M0589 Other rheumatoid arthritis with rheumatoid factor of multiple sites: Secondary | ICD-10-CM | POA: Diagnosis not present

## 2018-07-23 DIAGNOSIS — G2581 Restless legs syndrome: Secondary | ICD-10-CM | POA: Diagnosis not present

## 2018-07-23 DIAGNOSIS — Z79899 Other long term (current) drug therapy: Secondary | ICD-10-CM | POA: Diagnosis not present

## 2018-07-23 DIAGNOSIS — M2550 Pain in unspecified joint: Secondary | ICD-10-CM | POA: Diagnosis not present

## 2018-07-23 DIAGNOSIS — Z6820 Body mass index (BMI) 20.0-20.9, adult: Secondary | ICD-10-CM | POA: Diagnosis not present

## 2018-07-23 DIAGNOSIS — R5383 Other fatigue: Secondary | ICD-10-CM | POA: Diagnosis not present

## 2018-07-23 DIAGNOSIS — R202 Paresthesia of skin: Secondary | ICD-10-CM | POA: Diagnosis not present

## 2018-07-29 ENCOUNTER — Other Ambulatory Visit: Payer: Self-pay | Admitting: Cardiovascular Disease

## 2018-07-29 NOTE — Telephone Encounter (Signed)
Needs OV in December for further refills.

## 2018-07-30 DIAGNOSIS — M0589 Other rheumatoid arthritis with rheumatoid factor of multiple sites: Secondary | ICD-10-CM | POA: Diagnosis not present

## 2018-08-12 DIAGNOSIS — I10 Essential (primary) hypertension: Secondary | ICD-10-CM | POA: Diagnosis not present

## 2018-08-12 DIAGNOSIS — M0540 Rheumatoid myopathy with rheumatoid arthritis of unspecified site: Secondary | ICD-10-CM | POA: Diagnosis not present

## 2018-08-12 DIAGNOSIS — E785 Hyperlipidemia, unspecified: Secondary | ICD-10-CM | POA: Diagnosis not present

## 2018-09-12 DIAGNOSIS — H25013 Cortical age-related cataract, bilateral: Secondary | ICD-10-CM | POA: Diagnosis not present

## 2018-09-12 DIAGNOSIS — H04123 Dry eye syndrome of bilateral lacrimal glands: Secondary | ICD-10-CM | POA: Diagnosis not present

## 2018-09-12 DIAGNOSIS — H5203 Hypermetropia, bilateral: Secondary | ICD-10-CM | POA: Diagnosis not present

## 2018-09-12 DIAGNOSIS — H52223 Regular astigmatism, bilateral: Secondary | ICD-10-CM | POA: Diagnosis not present

## 2018-09-12 DIAGNOSIS — Z79899 Other long term (current) drug therapy: Secondary | ICD-10-CM | POA: Diagnosis not present

## 2018-09-12 DIAGNOSIS — D3132 Benign neoplasm of left choroid: Secondary | ICD-10-CM | POA: Diagnosis not present

## 2018-09-12 DIAGNOSIS — H40013 Open angle with borderline findings, low risk, bilateral: Secondary | ICD-10-CM | POA: Diagnosis not present

## 2018-09-12 DIAGNOSIS — M069 Rheumatoid arthritis, unspecified: Secondary | ICD-10-CM | POA: Diagnosis not present

## 2018-09-12 DIAGNOSIS — H40053 Ocular hypertension, bilateral: Secondary | ICD-10-CM | POA: Diagnosis not present

## 2018-09-20 DIAGNOSIS — L570 Actinic keratosis: Secondary | ICD-10-CM | POA: Diagnosis not present

## 2018-09-20 DIAGNOSIS — X32XXXD Exposure to sunlight, subsequent encounter: Secondary | ICD-10-CM | POA: Diagnosis not present

## 2018-09-20 DIAGNOSIS — D0472 Carcinoma in situ of skin of left lower limb, including hip: Secondary | ICD-10-CM | POA: Diagnosis not present

## 2018-10-01 DIAGNOSIS — M0589 Other rheumatoid arthritis with rheumatoid factor of multiple sites: Secondary | ICD-10-CM | POA: Diagnosis not present

## 2018-10-08 DIAGNOSIS — M545 Low back pain: Secondary | ICD-10-CM | POA: Diagnosis not present

## 2018-10-08 DIAGNOSIS — M25552 Pain in left hip: Secondary | ICD-10-CM | POA: Diagnosis not present

## 2018-10-11 DIAGNOSIS — M461 Sacroiliitis, not elsewhere classified: Secondary | ICD-10-CM | POA: Diagnosis not present

## 2018-10-16 DIAGNOSIS — M461 Sacroiliitis, not elsewhere classified: Secondary | ICD-10-CM | POA: Diagnosis not present

## 2018-10-17 DIAGNOSIS — M461 Sacroiliitis, not elsewhere classified: Secondary | ICD-10-CM | POA: Diagnosis not present

## 2018-10-21 DIAGNOSIS — M461 Sacroiliitis, not elsewhere classified: Secondary | ICD-10-CM | POA: Diagnosis not present

## 2018-10-24 DIAGNOSIS — M461 Sacroiliitis, not elsewhere classified: Secondary | ICD-10-CM | POA: Diagnosis not present

## 2018-10-28 ENCOUNTER — Other Ambulatory Visit: Payer: Self-pay | Admitting: Cardiovascular Disease

## 2018-10-28 DIAGNOSIS — M461 Sacroiliitis, not elsewhere classified: Secondary | ICD-10-CM | POA: Diagnosis not present

## 2018-10-30 DIAGNOSIS — M461 Sacroiliitis, not elsewhere classified: Secondary | ICD-10-CM | POA: Diagnosis not present

## 2018-11-04 DIAGNOSIS — M461 Sacroiliitis, not elsewhere classified: Secondary | ICD-10-CM | POA: Diagnosis not present

## 2018-11-06 DIAGNOSIS — M461 Sacroiliitis, not elsewhere classified: Secondary | ICD-10-CM | POA: Diagnosis not present

## 2018-11-12 DIAGNOSIS — M461 Sacroiliitis, not elsewhere classified: Secondary | ICD-10-CM | POA: Diagnosis not present

## 2018-11-14 DIAGNOSIS — M461 Sacroiliitis, not elsewhere classified: Secondary | ICD-10-CM | POA: Diagnosis not present

## 2018-11-15 DIAGNOSIS — M7062 Trochanteric bursitis, left hip: Secondary | ICD-10-CM | POA: Diagnosis not present

## 2018-11-18 DIAGNOSIS — M461 Sacroiliitis, not elsewhere classified: Secondary | ICD-10-CM | POA: Diagnosis not present

## 2018-11-20 DIAGNOSIS — M25552 Pain in left hip: Secondary | ICD-10-CM | POA: Diagnosis not present

## 2018-11-20 DIAGNOSIS — M461 Sacroiliitis, not elsewhere classified: Secondary | ICD-10-CM | POA: Diagnosis not present

## 2018-11-20 DIAGNOSIS — M1612 Unilateral primary osteoarthritis, left hip: Secondary | ICD-10-CM | POA: Diagnosis not present

## 2018-11-25 DIAGNOSIS — M461 Sacroiliitis, not elsewhere classified: Secondary | ICD-10-CM | POA: Diagnosis not present

## 2018-11-26 DIAGNOSIS — M0589 Other rheumatoid arthritis with rheumatoid factor of multiple sites: Secondary | ICD-10-CM | POA: Diagnosis not present

## 2018-11-26 DIAGNOSIS — Z79899 Other long term (current) drug therapy: Secondary | ICD-10-CM | POA: Diagnosis not present

## 2018-11-27 DIAGNOSIS — M461 Sacroiliitis, not elsewhere classified: Secondary | ICD-10-CM | POA: Diagnosis not present

## 2018-11-30 ENCOUNTER — Other Ambulatory Visit: Payer: Self-pay | Admitting: Women's Health

## 2018-11-30 DIAGNOSIS — Z79890 Hormone replacement therapy (postmenopausal): Secondary | ICD-10-CM

## 2018-12-02 DIAGNOSIS — M461 Sacroiliitis, not elsewhere classified: Secondary | ICD-10-CM | POA: Diagnosis not present

## 2018-12-05 DIAGNOSIS — M461 Sacroiliitis, not elsewhere classified: Secondary | ICD-10-CM | POA: Diagnosis not present

## 2018-12-10 DIAGNOSIS — M461 Sacroiliitis, not elsewhere classified: Secondary | ICD-10-CM | POA: Diagnosis not present

## 2018-12-12 DIAGNOSIS — M461 Sacroiliitis, not elsewhere classified: Secondary | ICD-10-CM | POA: Diagnosis not present

## 2018-12-16 DIAGNOSIS — M461 Sacroiliitis, not elsewhere classified: Secondary | ICD-10-CM | POA: Diagnosis not present

## 2018-12-17 DIAGNOSIS — M25552 Pain in left hip: Secondary | ICD-10-CM | POA: Diagnosis not present

## 2018-12-19 DIAGNOSIS — M461 Sacroiliitis, not elsewhere classified: Secondary | ICD-10-CM | POA: Diagnosis not present

## 2018-12-23 DIAGNOSIS — M25552 Pain in left hip: Secondary | ICD-10-CM | POA: Diagnosis not present

## 2018-12-31 ENCOUNTER — Other Ambulatory Visit: Payer: Self-pay

## 2019-01-01 ENCOUNTER — Ambulatory Visit (INDEPENDENT_AMBULATORY_CARE_PROVIDER_SITE_OTHER): Payer: Medicare Other | Admitting: Women's Health

## 2019-01-01 ENCOUNTER — Telehealth: Payer: Self-pay | Admitting: *Deleted

## 2019-01-01 ENCOUNTER — Encounter: Payer: Self-pay | Admitting: Women's Health

## 2019-01-01 VITALS — BP 132/82

## 2019-01-01 DIAGNOSIS — N644 Mastodynia: Secondary | ICD-10-CM | POA: Diagnosis not present

## 2019-01-01 MED ORDER — ESTRADIOL 0.5 MG PO TABS: 0.5000 mg | ORAL_TABLET | Freq: Every day | ORAL | 4 refills | Status: DC

## 2019-01-01 MED ORDER — BETAMETHASONE VALERATE 0.1 % EX OINT: 1.0000 "application " | TOPICAL_OINTMENT | Freq: Two times a day (BID) | CUTANEOUS | 0 refills | Status: DC

## 2019-01-01 NOTE — Progress Notes (Signed)
68 year old MWF G1 P 0 presents with complaint of right nipple irritation with redness and tenderness for the past few months but has increased over the last week.  No change in routine, injury, nipple discharge, or fever.  States feels questionable thickening behind nipple.  History of benign cysts in the past.  Normal mammogram 06/2018.  Postmenopausal on HRT did run out of prescription and has had numerous hot flushes causing poor sleep.  1997 TAH with BSO for fibroids.  Not sexually active, husband's health.  Exam: Appears well.  Heart regular rate and rhythm, lungs clear throughout, thyroid without palpable nodules, breast examined sitting and lying position, right areola erythemic without visible discharge, dimpling, retractions.  Breast tissue is dense with no nodules/masses.  Right areola erythema 97 TAH with BSO with hot flushes  Plan: Diagnostic mammogram with ultrasound, Valisone small amount twice daily to  areola.  Loose clothing, open to air as able.  HRT reviewed, risks of blood clots, strokes and breast cancer, states having much difficulty with rest due to hot flushes, had been on estradiol 1 mg, trying to take half tablet daily, has a stressful home life, husband stroke wheelchair-bound and has to do much of his care.  Reviewed if mammogram normal and no relief with Valisone will refer to dermatologist.  Options reviewed, will try estradiol 0.5 and if can tolerate 1/2 tablet would be best, prescription given.  Instructed to call if no relief.

## 2019-01-01 NOTE — Telephone Encounter (Signed)
-----   Message from Huel Cote, NP sent at 01/01/2019 11:23 AM EDT ----- Needs diag mammogram/US rt areola erythematous/tender no drainage and questionable thichening, anytime after noon is best, husband W/C bound

## 2019-01-01 NOTE — Telephone Encounter (Signed)
Patient scheduled at [facility] on 01/21/19 @ 1:15pm, order faxed, patient informed

## 2019-01-01 NOTE — Patient Instructions (Signed)

## 2019-01-06 DIAGNOSIS — K219 Gastro-esophageal reflux disease without esophagitis: Secondary | ICD-10-CM | POA: Diagnosis not present

## 2019-01-06 DIAGNOSIS — Z1211 Encounter for screening for malignant neoplasm of colon: Secondary | ICD-10-CM | POA: Diagnosis not present

## 2019-01-06 DIAGNOSIS — I10 Essential (primary) hypertension: Secondary | ICD-10-CM | POA: Diagnosis not present

## 2019-01-06 DIAGNOSIS — Z0001 Encounter for general adult medical examination with abnormal findings: Secondary | ICD-10-CM | POA: Diagnosis not present

## 2019-01-06 DIAGNOSIS — E785 Hyperlipidemia, unspecified: Secondary | ICD-10-CM | POA: Diagnosis not present

## 2019-01-06 DIAGNOSIS — M0540 Rheumatoid myopathy with rheumatoid arthritis of unspecified site: Secondary | ICD-10-CM | POA: Diagnosis not present

## 2019-01-06 DIAGNOSIS — Z1389 Encounter for screening for other disorder: Secondary | ICD-10-CM | POA: Diagnosis not present

## 2019-01-13 ENCOUNTER — Other Ambulatory Visit: Payer: Self-pay

## 2019-01-14 DIAGNOSIS — M25552 Pain in left hip: Secondary | ICD-10-CM | POA: Diagnosis not present

## 2019-01-15 ENCOUNTER — Encounter: Payer: Self-pay | Admitting: Women's Health

## 2019-01-15 ENCOUNTER — Other Ambulatory Visit: Payer: Self-pay

## 2019-01-15 ENCOUNTER — Ambulatory Visit (INDEPENDENT_AMBULATORY_CARE_PROVIDER_SITE_OTHER): Payer: Medicare Other | Admitting: Women's Health

## 2019-01-15 VITALS — BP 140/84 | Ht 66.5 in | Wt 134.0 lb

## 2019-01-15 DIAGNOSIS — Z01419 Encounter for gynecological examination (general) (routine) without abnormal findings: Secondary | ICD-10-CM | POA: Diagnosis not present

## 2019-01-15 DIAGNOSIS — Z79890 Hormone replacement therapy (postmenopausal): Secondary | ICD-10-CM | POA: Diagnosis not present

## 2019-01-15 MED ORDER — ESTRADIOL 0.5 MG PO TABS: 0.5000 mg | ORAL_TABLET | Freq: Every day | ORAL | 4 refills | Status: DC

## 2019-01-15 NOTE — Patient Instructions (Signed)
Health Maintenance After Age 70 After age 70, you are at a higher risk for certain long-term diseases and infections as well as injuries from falls. Falls are a major cause of broken bones and head injuries in people who are older than age 70. Getting regular preventive care can help to keep you healthy and well. Preventive care includes getting regular testing and making lifestyle changes as recommended by your health care provider. Talk with your health care provider about:  Which screenings and tests you should have. A screening is a test that checks for a disease when you have no symptoms.  A diet and exercise plan that is right for you. What should I know about screenings and tests to prevent falls? Screening and testing are the best ways to find a health problem early. Early diagnosis and treatment give you the best chance of managing medical conditions that are common after age 70. Certain conditions and lifestyle choices may make you more likely to have a fall. Your health care provider may recommend:  Regular vision checks. Poor vision and conditions such as cataracts can make you more likely to have a fall. If you wear glasses, make sure to get your prescription updated if your vision changes.  Medicine review. Work with your health care provider to regularly review all of the medicines you are taking, including over-the-counter medicines. Ask your health care provider about any side effects that may make you more likely to have a fall. Tell your health care provider if any medicines that you take make you feel dizzy or sleepy.  Osteoporosis screening. Osteoporosis is a condition that causes the bones to get weaker. This can make the bones weak and cause them to break more easily.  Blood pressure screening. Blood pressure changes and medicines to control blood pressure can make you feel dizzy.  Strength and balance checks. Your health care provider may recommend certain tests to check your  strength and balance while standing, walking, or changing positions.  Foot health exam. Foot pain and numbness, as well as not wearing proper footwear, can make you more likely to have a fall.  Depression screening. You may be more likely to have a fall if you have a fear of falling, feel emotionally low, or feel unable to do activities that you used to do.  Alcohol use screening. Using too much alcohol can affect your balance and may make you more likely to have a fall. What actions can I take to lower my risk of falls? General instructions  Talk with your health care provider about your risks for falling. Tell your health care provider if: ? You fall. Be sure to tell your health care provider about all falls, even ones that seem minor. ? You feel dizzy, sleepy, or off-balance.  Take over-the-counter and prescription medicines only as told by your health care provider. These include any supplements.  Eat a healthy diet and maintain a healthy weight. A healthy diet includes low-fat dairy products, low-fat (lean) meats, and fiber from whole grains, beans, and lots of fruits and vegetables. Home safety  Remove any tripping hazards, such as rugs, cords, and clutter.  Install safety equipment such as grab bars in bathrooms and safety rails on stairs.  Keep rooms and walkways well-lit. Activity   Follow a regular exercise program to stay fit. This will help you maintain your balance. Ask your health care provider what types of exercise are appropriate for you.  If you need a cane or   walker, use it as recommended by your health care provider.  Wear supportive shoes that have nonskid soles. Lifestyle  Do not drink alcohol if your health care provider tells you not to drink.  If you drink alcohol, limit how much you have: ? 0-1 drink a day for women. ? 0-2 drinks a day for men.  Be aware of how much alcohol is in your drink. In the [place] one drink equals one typical bottle of beer (12  oz), one-half glass of wine (5 oz), or one shot of hard liquor (1 oz).  Do not use any products that contain nicotine or tobacco, such as cigarettes and e-cigarettes. If you need help quitting, ask your health care provider. Summary  Having a healthy lifestyle and getting preventive care can help to protect your health and wellness after age 70.  Screening and testing are the best way to find a health problem early and help you avoid having a fall. Early diagnosis and treatment give you the best chance for managing medical conditions that are more common for people who are older than age 70.  Falls are a major cause of broken bones and head injuries in people who are older than age 70. Take precautions to prevent a fall at home.  Work with your health care provider to learn what changes you can make to improve your health and wellness and to prevent falls. This information is not intended to replace advice given to you by your health care provider. Make sure you discuss any questions you have with your health care provider. Document Released: 07/25/2017 Document Revised: 07/25/2017 Document Reviewed: 07/25/2017 Elsevier Interactive Patient Education  2019 [employer]vier Inc.  

## 2019-01-15 NOTE — Progress Notes (Signed)
Salah Burlison [name] November 21, 1950 [id number]    History:    Presents for breast and pelvic exam with no complaints.  Has had some nipple irritation has follow-up scheduled with [facility] in 2 weeks.  1997 TAH with BSO for fibroids on estradiol has not been able to come off due to numerous hot flushes, difficulty with sleep when not taking estradiol.  2010- colonoscopy.  Reports normal DEXA at primary care at [facility].  Rheumatoid arthritis on methotrexate.  Hypertension primary care manages.  Has had Pneumovax, unable to take Shingrex due to methotrexate.  Past medical history, past surgical history, family history and social history were all reviewed and documented in the EPIC chart.  Retired, husband CVA only can take a few steps, currently discussing possible dialysis.  Not sexually active/husband's health.  ROS:  A ROS was performed and pertinent positives and negatives are included.  Exam:  Vitals:   01/15/19 1253  BP: 140/84  Weight: 134 lb (60.8 kg)  Height: 5' 6.5" (1.689 m)   Body mass index is 21.3 kg/m.   General appearance:  Normal Thyroid:  Symmetrical, normal in size, without palpable masses or nodularity. Respiratory  Auscultation:  Clear without wheezing or rhonchi Cardiovascular  Auscultation:  Regular rate, without rubs, murmurs or gallops  Edema/varicosities:  Not grossly evident Abdominal  Soft,nontender, without masses, guarding or rebound.  Liver/spleen:  No organomegaly noted  Hernia:  None appreciated  Skin  Inspection:  Grossly normal   Breasts: Examined lying and sitting.     Right: Without masses, retractions, discharge or axillary adenopathy.     Left: Without masses, retractions, discharge or axillary adenopathy. Gentitourinary   Inguinal/mons:  Normal without inguinal adenopathy  External genitalia:  Normal  BUS/Urethra/Skene's glands:  Normal  Vagina: Atrophic   Cervix: Absent uterus: Absent  Adnexa/parametria:     Rt: Without masses or  tenderness.   Lt: Without masses or tenderness.  Anus and perineum: Normal  Digital rectal exam: Normal sphincter tone without palpated masses or tenderness  Assessment/Plan:  68 y.o. MWF G1, P0  for breast and pelvic exam and requesting refill of estradiol.  1997 TAH with BSO for fibroids on estradiol Right nipple irritation follow-up mammogram scheduled RA rheumatologist manages Hypertension primary care manages labs and meds  Plan: Keep scheduled follow-up mammogram at [facility].  HRT reviewed risks of blood clots, strokes and breast cancer, states unable to tolerate no estrogen estradiol 0.5 mg p.o. daily had been on 1 mg, reviewed best if can gradually decrease to .25 which would decrease risks.  SBEs, continue annual screening mammogram, calcium rich foods, vitamin D 2000 daily encouraged.  Continue to work on increasing regular cardio type and balance exercise such as yoga, home safety, fall prevention discussed.  DEXA at primary care.  Leisure activities and self-care encouraged.  Condolences given regarding husband's poor health.   [place], 2:48 PM 01/15/2019

## 2019-01-20 DIAGNOSIS — E785 Hyperlipidemia, unspecified: Secondary | ICD-10-CM | POA: Diagnosis not present

## 2019-01-20 DIAGNOSIS — M0540 Rheumatoid myopathy with rheumatoid arthritis of unspecified site: Secondary | ICD-10-CM | POA: Diagnosis not present

## 2019-01-20 DIAGNOSIS — I10 Essential (primary) hypertension: Secondary | ICD-10-CM | POA: Diagnosis not present

## 2019-01-20 DIAGNOSIS — K219 Gastro-esophageal reflux disease without esophagitis: Secondary | ICD-10-CM | POA: Diagnosis not present

## 2019-01-21 DIAGNOSIS — M0589 Other rheumatoid arthritis with rheumatoid factor of multiple sites: Secondary | ICD-10-CM | POA: Diagnosis not present

## 2019-01-22 ENCOUNTER — Other Ambulatory Visit: Payer: Self-pay | Admitting: Cardiovascular Disease

## 2019-01-22 DIAGNOSIS — M2550 Pain in unspecified joint: Secondary | ICD-10-CM | POA: Diagnosis not present

## 2019-01-22 DIAGNOSIS — Z79899 Other long term (current) drug therapy: Secondary | ICD-10-CM | POA: Diagnosis not present

## 2019-01-22 DIAGNOSIS — M0589 Other rheumatoid arthritis with rheumatoid factor of multiple sites: Secondary | ICD-10-CM | POA: Diagnosis not present

## 2019-01-23 NOTE — Telephone Encounter (Signed)
Amlodipine 10 mg refilled. 

## 2019-01-28 DIAGNOSIS — N644 Mastodynia: Secondary | ICD-10-CM | POA: Diagnosis not present

## 2019-01-28 DIAGNOSIS — Z803 Family history of malignant neoplasm of breast: Secondary | ICD-10-CM | POA: Diagnosis not present

## 2019-01-28 DIAGNOSIS — L539 Erythematous condition, unspecified: Secondary | ICD-10-CM | POA: Diagnosis not present

## 2019-01-29 ENCOUNTER — Other Ambulatory Visit: Payer: Self-pay | Admitting: Radiology

## 2019-01-29 DIAGNOSIS — N644 Mastodynia: Secondary | ICD-10-CM

## 2019-02-05 ENCOUNTER — Encounter: Payer: Self-pay | Admitting: Women's Health

## 2019-02-05 ENCOUNTER — Other Ambulatory Visit: Payer: Self-pay

## 2019-02-05 ENCOUNTER — Ambulatory Visit
Admission: RE | Admit: 2019-02-05 | Discharge: 2019-02-05 | Disposition: A | Discharge status: Home / Self Care | Payer: Medicare Other | Source: Ambulatory Visit | Attending: Radiology | Admitting: Radiology

## 2019-02-05 ENCOUNTER — Telehealth: Payer: Self-pay | Admitting: Cardiovascular Disease

## 2019-02-05 DIAGNOSIS — N644 Mastodynia: Secondary | ICD-10-CM | POA: Diagnosis not present

## 2019-02-05 MED ORDER — GADOBUTROL 1 MMOL/ML IV SOLN
6.0000 mL | Freq: Once | INTRAVENOUS | Status: AC | PRN
  Administered 2019-02-05: 6 mL via INTRAVENOUS

## 2019-02-07 ENCOUNTER — Telehealth: Payer: Medicare Other | Admitting: Cardiovascular Disease

## 2019-02-07 ENCOUNTER — Telehealth (INDEPENDENT_AMBULATORY_CARE_PROVIDER_SITE_OTHER): Payer: Medicare Other | Admitting: Cardiovascular Disease

## 2019-02-07 VITALS — BP 140/63 | Ht 68.0 in | Wt 135.0 lb

## 2019-02-07 DIAGNOSIS — I700 Atherosclerosis of aorta: Secondary | ICD-10-CM

## 2019-02-07 DIAGNOSIS — E7800 Pure hypercholesterolemia, unspecified: Secondary | ICD-10-CM | POA: Diagnosis not present

## 2019-02-07 DIAGNOSIS — I10 Essential (primary) hypertension: Secondary | ICD-10-CM | POA: Diagnosis not present

## 2019-02-07 MED ORDER — COENZYME Q10 300 MG PO CAPS: 1.0000 | ORAL_CAPSULE | Freq: Every day | ORAL | 0 refills | Status: DC

## 2019-02-07 MED ORDER — ROSUVASTATIN CALCIUM 10 MG PO TABS: 10.0000 mg | ORAL_TABLET | Freq: Every day | ORAL | 0 refills | Status: DC

## 2019-02-07 NOTE — Progress Notes (Signed)
Virtual Visit via Video Note   This visit type was conducted due to national recommendations for restrictions regarding the COVID-19 Pandemic (e.g. social distancing) in an effort to limit this patient's exposure and mitigate transmission in our community.  Due to her co-morbid illnesses, this patient is at least at moderate risk for complications without adequate follow up.  This format is felt to be most appropriate for this patient at this time.  All issues noted in this document were discussed and addressed.  A limited physical exam was performed with this format.  Please refer to the patient's chart for her consent to telehealth for [facility].   Date:  02/07/2019   ID:  Patricia Long, Patricia Long [birth year], MRN [id number]  Patient Location: Home Provider Location: Home  PCP:  Josetta Huddle, MD  Cardiologist:  [place] Electrophysiologist:  None   Evaluation Performed:  Follow-Up Visit  Chief Complaint: Hypercholesterolemia, hypertension, aortic atherosclerosis  History of Present Illness:    Patricia Long is a 68 y.o. female with moderate hypercholesterolemia, systolic hypertension and aortic atherosclerosis, but without known symptomatic coronary or peripheral vascular disease.  Dr. Inda Merlin added valsartan to her amlodipine since her blood pressure was not well controlled.  She is taking both medications at night.  When she wakes up in the morning she often feels lightheaded, but the symptoms have been slowly improving as she becomes accustomed to the medication.  She continues to have a rather low diastolic blood pressure, sometimes in the 50s.  Her systolic blood pressure has generally been in the low to mid 120s.  She did not tolerate atorvastatin which caused terrible muscle aches and cramps.  Her most recent LDL cholesterol was 127, without medications.  She continues to have an excellent HDL cholesterol at 76.  The patient does not have symptoms concerning for COVID-19  infection (fever, chills, cough, or new shortness of breath).    Past Medical History:  Diagnosis Date  . Basal cell carcinoma ~ 2013   "legs"  . Chronic bronchitis ([place])    "I've had it several times but not q yr" (01/13/2015)  . Endometriosis   . Fibroid   . Irritable bowel syndrome   . Pneumonia 1970's X 1; ~ 2013  . PONV (postoperative nausea and vomiting)   . Rheumatoid arthritis(714.0)    Past Surgical History:  Procedure Laterality Date  . BASAL CELL CARCINOMA EXCISION  ~ 2013   "legs"  . BREAST CYST EXCISION Bilateral 1992   NODULE EXCISION OF RIGHT AND LEFT BREAST  . FOOT SURGERY Bilateral ~ 2006-2008   "reconstruction"  . JOINT REPLACEMENT Left 2009   WRIST AND FINGER   . KNEE ARTHROSCOPY Right 1980's  . PELVIC LAPAROSCOPY  '82 AND '87  . TOTAL ABDOMINAL HYSTERECTOMY  1997   TAH/BSO     Current Meds  Medication Sig  . amLODipine (NORVASC) 10 MG tablet TAKE 1 TABLET BY MOUTH  DAILY.  Marland Kitchen betamethasone valerate ointment (VALISONE) 0.1 % Apply 1 application topically 2 (two) times daily.  . cetirizine (ZYRTEC) 10 MG tablet Take 10 mg by mouth daily as needed (remicaide infusion).  Marland Kitchen estradiol (ESTRACE) 0.5 MG tablet Take 1 tablet (0.5 mg total) by mouth daily.  . folic acid (FOLVITE) 1 MG tablet Take 1 mg by mouth daily.  . hydroxychloroquine (PLAQUENIL) 200 MG tablet Take 200 mg by mouth daily.  Marland Kitchen inFLIXimab (REMICADE) 100 MG injection Inject 100 mg into the vein every 8 (eight) weeks.  Marland Kitchen  Methotrexate, Anti-Rheumatic, (METHOTREXATE, PF, [place]) Inject 20 mg into the skin once a week.  . Multiple Vitamin (MULTIVITAMIN) tablet Take 1 tablet by mouth daily.  . valsartan (DIOVAN) 80 MG tablet      Allergies:   Prochlorperazine edisylate; Prochlorperazine edisylate; Codeine; Prochlorperazine; and Tape   Social History   Tobacco Use  . Smoking status: Never Smoker  . Smokeless tobacco: Never Used  Substance Use Topics  . Alcohol use: Yes    Alcohol/week: 2.0 standard  drinks    Types: 1 Glasses of wine, 1 Standard drinks or equivalent per week  . Drug use: No     Family Hx: The patient's family history includes Colon cancer in her maternal aunt and paternal uncle; Heart disease in her father and mother. There is no history of Colon polyps, Diabetes, Kidney disease, or Esophageal cancer.  ROS:   Please see the history of present illness.     All other systems reviewed and are negative.   Prior CV studies:   The following studies were reviewed today:  Labs and recent lipid profile performed by Dr. Inda Merlin.  All labs are normal.  LDL 127, HDL 76, normal triglycerides.  Labs/Other Tests and Data Reviewed:    EKG:  An ECG dated 09/17/2017 was personally reviewed today and demonstrated:  Sinus rhythm, normal tracing  Recent Labs: Labs from PCP from January 21, 2019 Normal liver function tests, CRP 1.0, creatinine 0.89, hemoglobin 13.6 Total cholesterol 241, HDL 76, LDL 127, triglycerides 191  Recent Lipid Panel Lab Results  Component Value Date/Time   CHOL 259 (H) 09/21/2017 11:19 AM   TRIG 45 09/21/2017 11:19 AM   HDL 108 09/21/2017 11:19 AM   CHOLHDL 2.4 09/21/2017 11:19 AM   LDLCALC 142 (H) 09/21/2017 11:19 AM    Wt Readings from Last 3 Encounters:  02/07/19 135 lb (61.2 kg)  01/15/19 134 lb (60.8 kg)  02/20/18 128 lb (58.1 kg)     Objective:    Vital Signs:  BP 140/63   Ht 5\' 8"  (1.727 m)   Wt 135 lb (61.2 kg)   BMI 20.53 kg/m    VITAL SIGNS:  reviewed GEN:  no acute distress EYES:  sclerae anicteric, EOMI - Extraocular Movements Intact RESPIRATORY:  normal respiratory effort, symmetric expansion CARDIOVASCULAR:  no peripheral edema SKIN:  no rash, lesions or ulcers. MUSCULOSKELETAL:  no obvious deformities. NEURO:  alert and oriented x 3, no obvious focal deficit PSYCH:  normal affect  ASSESSMENT & PLAN:    1. HTN: Recommended trying to take the valsartan in the morning and continue amlodipine at night to see if that  relieves some of the complaints of hypotension that she experiences in the mornings.  Might have to reduce the dose of amlodipine if that does not work.  She has a very broad pulse pressure and may have to tolerate slight systolic hypertension. 2. HLP: Intolerant to atorvastatin.  We will try once weekly rosuvastatin with coenzyme Q 10 and recheck a lipid profile in 3 months.  Target LDL less than 100. 3. Aortic atherosclerosis: She has no symptoms of coronary or peripheral vascular disease and has never had a CVA/TIA, but this marks her at risk for development of symptomatic vascular complications.  She is very lean and has a very healthy diet.  Her activity level has been somewhat curtailed by the coronavirus epidemic and her husband's health problems, but I doubt she could make much progress in her lipid profile with additional lifestyle changes.  4. RA: On methotrexate and hydroxychloroquine.  Not requiring steroids.  COVID-19 Education: The signs and symptoms of COVID-19 were discussed with the patient and how to seek care for testing (follow up with PCP or arrange E-visit).  The importance of social distancing was discussed today.  Time:   Today, I have spent 16 minutes with the patient with telehealth technology discussing the above problems.     Medication Adjustments/Labs and Tests Ordered: Current medicines are reviewed at length with the patient today.  Concerns regarding medicines are outlined above.   Tests Ordered: No orders of the defined types were placed in this encounter.   Medication Changes: No orders of the defined types were placed in this encounter.   Disposition:  Follow up 1 year  Signed, Sanda Klein, MD  02/07/2019 11:02 AM    [place]

## 2019-02-07 NOTE — Patient Instructions (Signed)
Medication Instructions:  START Rosuvastatin 10 mg---take 1 tablet once weekly.  START Co-Q-10 300 mg daily.  Take Valsartan in the morning instead of evening.  If you need a refill on your cardiac medications before your next appointment, please call your pharmacy.   Lab work: Your physician recommends that you return for a FASTING lipid profile in 3 months. I will have this lab slip mailed to you.  If you have labs (blood work) drawn today and your tests are completely normal, you will receive your results only by: Marland Kitchen MyChart Message (if you have MyChart) OR . A paper copy in the mail If you have any lab test that is abnormal or we need to change your treatment, we will call you to review the results.  Follow-Up: At [facility], you and your health needs are our priority.  As part of our continuing mission to provide you with exceptional heart care, we have created designated Provider Care Teams.  These Care Teams include your primary Cardiologist (physician) and Advanced Practice Providers (APPs -  Physician Assistants and Nurse Practitioners) who all work together to provide you with the care you need, when you need it. You will need a follow up appointment in 12 months.  Please call our office 2 months in advance to schedule this appointment.  You may see Sanda Klein, MD or one of the following Advanced Practice Providers on your designated Care Team: [place], [place] . Fabian Sharp, PA-C  Any Other Special Instructions Will Be Listed Below (If Applicable). None

## 2019-02-10 ENCOUNTER — Other Ambulatory Visit: Payer: Self-pay | Admitting: Radiology

## 2019-02-10 DIAGNOSIS — R9389 Abnormal findings on diagnostic imaging of other specified body structures: Secondary | ICD-10-CM

## 2019-02-11 ENCOUNTER — Telehealth: Payer: Self-pay

## 2019-02-11 MED ORDER — COENZYME Q10 200 MG PO CAPS: 200.0000 mg | ORAL_CAPSULE | Freq: Every day | ORAL | 3 refills | Status: DC

## 2019-02-11 NOTE — Telephone Encounter (Signed)
CoQ10 200mg  daily will be fine.

## 2019-02-11 NOTE — Telephone Encounter (Signed)
Received fax from [facility] to clarify the strength for Coenzyme Q10 300mg  cap, for this patient. It is currently available in strengths of 100mg  OTC and 200mg . Please include new directions if necessary. Will forward to the pharmacy pool for recommendations.

## 2019-02-21 ENCOUNTER — Ambulatory Visit
Admission: RE | Admit: 2019-02-21 | Discharge: 2019-02-21 | Disposition: A | Discharge status: Home / Self Care | Payer: Medicare Other | Source: Ambulatory Visit | Attending: Radiology | Admitting: Radiology

## 2019-02-21 ENCOUNTER — Other Ambulatory Visit: Payer: Self-pay

## 2019-02-21 ENCOUNTER — Other Ambulatory Visit: Payer: Self-pay | Admitting: Cardiovascular Disease

## 2019-02-21 DIAGNOSIS — C50311 Malignant neoplasm of lower-inner quadrant of right female breast: Secondary | ICD-10-CM | POA: Diagnosis not present

## 2019-02-21 DIAGNOSIS — R928 Other abnormal and inconclusive findings on diagnostic imaging of breast: Secondary | ICD-10-CM | POA: Diagnosis not present

## 2019-02-21 DIAGNOSIS — C50411 Malignant neoplasm of upper-outer quadrant of right female breast: Secondary | ICD-10-CM | POA: Diagnosis not present

## 2019-02-21 DIAGNOSIS — R9389 Abnormal findings on diagnostic imaging of other specified body structures: Secondary | ICD-10-CM

## 2019-02-21 MED ORDER — GADOBUTROL 1 MMOL/ML IV SOLN
6.0000 mL | Freq: Once | INTRAVENOUS | Status: AC | PRN
  Administered 2019-02-21: 6 mL via INTRAVENOUS

## 2019-02-26 ENCOUNTER — Encounter: Payer: Self-pay | Admitting: *Deleted

## 2019-02-26 ENCOUNTER — Telehealth: Payer: Self-pay | Admitting: Oncology

## 2019-02-26 DIAGNOSIS — D0511 Intraductal carcinoma in situ of right breast: Secondary | ICD-10-CM | POA: Insufficient documentation

## 2019-02-26 NOTE — Telephone Encounter (Signed)
Spoke with patient to confirm morning BC appointment for 6/10, will get packet from [place], will also email just in case

## 2019-02-27 DIAGNOSIS — M0540 Rheumatoid myopathy with rheumatoid arthritis of unspecified site: Secondary | ICD-10-CM | POA: Diagnosis not present

## 2019-02-27 DIAGNOSIS — M8580 Other specified disorders of bone density and structure, unspecified site: Secondary | ICD-10-CM | POA: Diagnosis not present

## 2019-02-27 DIAGNOSIS — D0511 Intraductal carcinoma in situ of right breast: Secondary | ICD-10-CM | POA: Diagnosis not present

## 2019-02-27 DIAGNOSIS — E785 Hyperlipidemia, unspecified: Secondary | ICD-10-CM | POA: Diagnosis not present

## 2019-02-27 DIAGNOSIS — C50911 Malignant neoplasm of unspecified site of right female breast: Secondary | ICD-10-CM | POA: Diagnosis not present

## 2019-02-27 DIAGNOSIS — I10 Essential (primary) hypertension: Secondary | ICD-10-CM | POA: Diagnosis not present

## 2019-03-03 ENCOUNTER — Other Ambulatory Visit: Payer: Self-pay | Admitting: *Deleted

## 2019-03-03 ENCOUNTER — Encounter: Payer: Self-pay | Admitting: Women's Health

## 2019-03-04 ENCOUNTER — Encounter: Payer: Self-pay | Admitting: Women's Health

## 2019-03-04 ENCOUNTER — Encounter: Payer: Self-pay | Admitting: Oncology

## 2019-03-04 NOTE — Progress Notes (Signed)
[place]  [phone] [phone]     ID: Patricia Long DOB: 1951/01/23  MR#: [id number]  OMV#:[id number]  Patient Care Team: Josetta Huddle, MD as PCP - General (Internal Medicine) Croitoru, Dani Gobble, MD as Consulting Physician (Cardiology) Mauro Kaufmann, RN as Oncology Nurse Navigator Rockwell Germany, RN as Oncology Nurse Navigator Quincie Haroon, Virgie Dad, MD as Consulting Physician (Oncology) Stark Klein, MD as Consulting Physician (General Surgery) Eppie Gibson, MD as Attending Physician (Radiation Oncology) Huel Cote, NP as Nurse Practitioner (Obstetrics and Gynecology) Chauncey Cruel, MD OTHER MD:  CHIEF COMPLAINT: Ductal carcinoma in situ, estrogen receptor positive  CURRENT TREATMENT: Awaiting definitive surgery   HISTORY OF CURRENT ILLNESS: ZYKIRIA BRUENING presented to her gynecologist with right nipple and retroareolar tenderness and redness intermittently for one year, with worsening redness and pain for 2 months. She underwent right diagnostic mammography and right breast ultrasonography at [facility] on 01/28/2019 showing: breast density category C; no evident etiology for nipple erythema or discomfort. She proceeded to bilateral breast MRI on 02/05/2019 to evaluate for possible Paget's disease. This showed: nonspecific non-mass enhancements within the central superior and lower inner right breast; asymmetric enhancement of the right nipple; no abnormal-appearing lymph nodes.  Accordingly on 02/21/2019 she proceeded to biopsy of the two right breast areas in question. The pathology from this procedure ([id number]) showed:  1. 11:30: mammary carcinoma in situ, high grade, necrosis is observed, e-cadherin positive. Prognostic indicators significant for: estrogen receptor, 100% positive and progesterone receptor, 40% positive, both with strong staining intensity. 2. 4 o'clock: mammary carcinoma in situ, high grade, necrosis is  observed, microinvasion cannot be excluded, e-cadherin positive. Of note, there is minimal tumor present. Prognostic indicators significant for: estrogen receptor, 100% positive and progesterone receptor, 10% positive, both with strong staining intensity.   Diagnostic left mammogram and right axilla ultrasound were performed on 02/27/2019 at [facility]. Both scans were benign.  The patient's subsequent history is as detailed below.   INTERVAL HISTORY: Jennie was evaluated in the multidisciplinary breast cancer clinic on 03/05/2019. Her case was also presented at the multidisciplinary breast cancer conference on the same day. At that time a preliminary plan was proposed: Mastectomy, no adjuvant radiation, consideration of antiestrogens. She is planning to undergo reconstruction.   REVIEW OF SYSTEMS: Sharlene reports she experienced nipple redness for about a year, but it became painful and was rubbing against her bra in the last 2 months. She reports her rheumatoid arthritis is very aggressive, and she has undergone 17 surgeries for it over the last 30+ years. She states she never experienced any bleeding issues with her prior surgeries, it was just this breast biopsy. Her husband is currently admitted for aneurysm.   She has had multiple breast biopsies and bled with 3 of them.  In one case apparently an artery was nicked because she had spurting blood immediately from the procedure.  In the other 2 cases there was bleeding approximately a week later.  On the other hand she has had 17 orthopedic surgeries and has had no unusual bleeding from any of them.  There is no family history of a bleeding problem.  A detailed review of systems today was otherwise noncontributory   PAST MEDICAL HISTORY: Past Medical History:  Diagnosis Date   Basal cell carcinoma ~ 2013   "legs"   Chronic bronchitis ([place])    "I've had it several times but not q yr" (01/13/2015)   Endometriosis    Fibroid  Hypertension      Irritable bowel syndrome    Pneumonia 1970's X 1; ~ 2013   PONV (postoperative nausea and vomiting)    Rheumatoid arthritis(714.0)     PAST SURGICAL HISTORY: Past Surgical History:  Procedure Laterality Date   BASAL CELL CARCINOMA EXCISION  ~ 2013   "legs"   BREAST CYST EXCISION Bilateral 1992   NODULE EXCISION OF RIGHT AND LEFT BREAST   FOOT SURGERY Bilateral ~ 2006-2008   "reconstruction"   JOINT REPLACEMENT Left 2009   WRIST AND FINGER    KNEE ARTHROSCOPY Right 1980's   PELVIC LAPAROSCOPY  '82 AND '87   TOTAL ABDOMINAL HYSTERECTOMY  1997   TAH/BSO  She has had 17 surgeries for her rheumatoid arthritis.   FAMILY HISTORY: Family History  Problem Relation Age of Onset   Heart disease Mother    Heart attack Mother    Heart disease Father    Heart attack Father    Colon cancer Maternal Aunt    Colon cancer Paternal Uncle    Breast cancer Maternal Aunt 74   Colon polyps Neg Hx    Diabetes Neg Hx    Kidney disease Neg Hx    Esophageal cancer Neg Hx    Patient's father was 36 years old when he died from a heart attack. Patient's mother died from heart attack at age 59. The patient notes a family hx of breast cancer. A maternal aunt was diagnosed with breast cancer at age 3. She has 3 siblings, 1 brother and 2 sisters. She denies a family history of ovarian cancer. She notes a history of colon cancer in a maternal aunt and a paternal uncle.  GYNECOLOGIC HISTORY:  No LMP recorded. Patient has had a hysterectomy. Menarche: 68 years old [place] P 0 LMP 1997 Contraceptive used from [id number] with no complications HRT yes, since 1997, continues for symptom management   Hysterectomy? Yes, 1997, for fibroids BSO? 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 She is married. Husband Coralyn Mark is currently wheelchair-bound following a stroke. He used to sell diesel. She lives at home with husband, but he  is currently inpatient for an aneurysm. She attends [employer].     ADVANCED DIRECTIVES: Her HCPOA is her husband, but she has her niece, Casimiro Needle, as a back up.  Christy's phone number is [phone].   HEALTH MAINTENANCE: Social History   Tobacco Use   Smoking status: Never Smoker   Smokeless tobacco: Never Used  Substance Use Topics   Alcohol use: Yes    Alcohol/week: 1.0 standard drinks    Types: 1 Glasses of wine per week   Drug use: No     Colonoscopy: 08/24/2009, normal  PAP: 2015  Bone density: 12/16/2014 (at [place])   Allergies  Allergen Reactions   Prochlorperazine Edisylate Other (See Comments)    compazine   Prochlorperazine Edisylate     Other reaction(s): Other (See Comments) compazine   Codeine Nausea Only and Rash   Prochlorperazine Rash   Tape Rash    Plastic tape, not paper tape    Current Outpatient Medications  Medication Sig Dispense Refill   amLODipine (NORVASC) 10 MG tablet TAKE 1 TABLET BY MOUTH  DAILY 90 tablet 3   betamethasone valerate ointment (VALISONE) 0.1 % Apply 1 application topically 2 (two) times daily. 30 g 0   cetirizine (ZYRTEC) 10 MG tablet Take 10 mg by mouth daily as needed (remicaide infusion).  Coenzyme Q10 200 MG capsule Take 1 capsule (200 mg total) by mouth daily. 90 capsule 3   estradiol (ESTRACE) 0.5 MG tablet Take 1 tablet (0.5 mg total) by mouth daily. 90 tablet 4   folic acid (FOLVITE) 1 MG tablet Take 1 mg by mouth daily.     hydroxychloroquine (PLAQUENIL) 200 MG tablet Take 200 mg by mouth daily.     inFLIXimab (REMICADE) 100 MG injection Inject 100 mg into the vein every 8 (eight) weeks.     Methotrexate, Anti-Rheumatic, (METHOTREXATE, PF, [place]) Inject 20 mg into the skin once a week.     Multiple Vitamin (MULTIVITAMIN) tablet Take 1 tablet by mouth daily.     rosuvastatin (CRESTOR) 10 MG tablet TAKE 1 TABLET BY MOUTH  DAILY 90 tablet 3   valsartan (DIOVAN) 80 MG tablet Take 1 tablet by  mouth every morning.     No current facility-administered medications for this visit.     OBJECTIVE: Middle-aged white woman who appears stated age  18:   03/05/19 0904  BP: 122/61  Pulse: 62  Resp: 17  Temp: 98.9 F (37.2 C)  SpO2: 100%     Body mass index is 20.66 kg/m.   Wt Readings from Last 3 Encounters:  03/05/19 135 lb 14.4 oz (61.6 kg)  02/07/19 135 lb (61.2 kg)  01/15/19 134 lb (60.8 kg)      ECOG FS:1 - Symptomatic but completely ambulatory  Ocular: Sclerae unicteric, pupils round and equal Wearing a mask Lymphatic: No cervical or supraclavicular adenopathy Lungs no rales or rhonchi Heart regular rate and rhythm Abd soft, nontender, positive bowel sounds MSK no focal spinal tenderness, no joint edema Neuro: non-focal, well-oriented, appropriate affect Breasts: The right breast is status post recent biopsies and shows significant ecchymoses.  There are no other findings of concern and while the nipple is slightly irritated looking it is not inverted.  The left breast is unremarkable.  Both axillae are benign.   LAB RESULTS:  CMP     Component Value Date/Time   NA 141 03/05/2019 0837   NA 142 09/21/2017 1119   K 4.3 03/05/2019 0837   CL 104 03/05/2019 0837   CO2 29 03/05/2019 0837   GLUCOSE 81 03/05/2019 0837   BUN 19 03/05/2019 0837   BUN 20 09/21/2017 1119   CREATININE 0.88 03/05/2019 0837   CALCIUM 8.8 (L) 03/05/2019 0837   PROT 6.1 (L) 03/05/2019 0837   PROT 6.6 09/21/2017 1119   ALBUMIN 3.6 03/05/2019 0837   ALBUMIN 4.3 09/21/2017 1119   AST 25 03/05/2019 0837   ALT 27 03/05/2019 0837   ALKPHOS 52 03/05/2019 0837   BILITOT 0.4 03/05/2019 0837   GFRNONAA >60 03/05/2019 0837   GFRAA >60 03/05/2019 0837    No results found for: TOTALPROTELP, ALBUMINELP, A1GS, A2GS, BETS, BETA2SER, GAMS, MSPIKE, SPEI  No results found for: KPAFRELGTCHN, LAMBDASER, KAPLAMBRATIO  Lab Results  Component Value Date   WBC 6.1 03/05/2019   NEUTROABS 3.4  03/05/2019   HGB 13.0 03/05/2019   HCT 38.0 03/05/2019   MCV 101.1 (H) 03/05/2019   PLT 258 03/05/2019    _0 @  No results found for: LABCA2  No components found for: ONGEXB284  No results for input(s): INR in the last 168 hours.  No results found for: LABCA2  No results found for: XLK440  No results found for: NUU725  No results found for: DGU440  No results found for: CA2729  No components found for: HGQUANT  No  results found for: CEA1 / No results found for: CEA1   No results found for: AFPTUMOR  No results found for: [place]  No results found for: PSA1  Appointment on 03/05/2019  Component Date Value Ref Range Status   Sodium 03/05/2019 141  135 - 145 mmol/L Final   Potassium 03/05/2019 4.3  3.5 - 5.1 mmol/L Final   Chloride 03/05/2019 104  98 - 111 mmol/L Final   CO2 03/05/2019 29  22 - 32 mmol/L Final   Glucose, Bld 03/05/2019 81  70 - 99 mg/dL Final   BUN 03/05/2019 19  8 - 23 mg/dL Final   Creatinine 03/05/2019 0.88  0.44 - 1.00 mg/dL Final   Calcium 03/05/2019 8.8* 8.9 - 10.3 mg/dL Final   Total Protein 03/05/2019 6.1* 6.5 - 8.1 g/dL Final   Albumin 03/05/2019 3.6  3.5 - 5.0 g/dL Final   AST 03/05/2019 25  15 - 41 U/L Final   ALT 03/05/2019 27  0 - 44 U/L Final   Alkaline Phosphatase 03/05/2019 52  38 - 126 U/L Final   Total Bilirubin 03/05/2019 0.4  0.3 - 1.2 mg/dL Final   GFR, Est Non Af Am 03/05/2019 >60  >60 mL/min Final   GFR, Est AFR Am 03/05/2019 >60  >60 mL/min Final   Anion gap 03/05/2019 8  5 - 15 Final   Performed at [facility], [place] [address] [place], [place] [address]   WBC Count 03/05/2019 6.1  4.0 - 10.5 K/uL Final   RBC 03/05/2019 3.76* 3.87 - 5.11 MIL/uL Final   Hemoglobin 03/05/2019 13.0  12.0 - 15.0 g/dL Final   HCT 03/05/2019 38.0  36.0 - 46.0 % Final   MCV 03/05/2019 101.1* 80.0 - 100.0 fL Final   MCH 03/05/2019 34.6* 26.0 - 34.0 pg Final   MCHC 03/05/2019 34.2   30.0 - 36.0 g/dL Final   RDW 03/05/2019 13.3  11.5 - 15.5 % Final   Platelet Count 03/05/2019 258  150 - 400 K/uL Final   nRBC 03/05/2019 0.0  0.0 - 0.2 % Final   Neutrophils Relative % 03/05/2019 55  % Final   Neutro Abs 03/05/2019 3.4  1.7 - 7.7 K/uL Final   Lymphocytes Relative 03/05/2019 23  % Final   Lymphs Abs 03/05/2019 1.4  0.7 - 4.0 K/uL Final   Monocytes Relative 03/05/2019 16  % Final   Monocytes Absolute 03/05/2019 1.0  0.1 - 1.0 K/uL Final   Eosinophils Relative 03/05/2019 5  % Final   Eosinophils Absolute 03/05/2019 0.3  0.0 - 0.5 K/uL Final   Basophils Relative 03/05/2019 1  % Final   Basophils Absolute 03/05/2019 0.1  0.0 - 0.1 K/uL Final   Immature Granulocytes 03/05/2019 0  % Final   Abs Immature Granulocytes 03/05/2019 0.01  0.00 - 0.07 K/uL Final   Performed at [facility], [place] [address], [place], [place] [address]    (this displays the last labs from the last 3 days)  No results found for: TOTALPROTELP, ALBUMINELP, A1GS, A2GS, BETS, BETA2SER, GAMS, MSPIKE, SPEI (this displays SPEP labs)  No results found for: KPAFRELGTCHN, LAMBDASER, KAPLAMBRATIO (kappa/lambda light chains)  No results found for: HGBA, HGBA2QUANT, HGBFQUANT, HGBSQUAN (Hemoglobinopathy evaluation)   No results found for: LDH  No results found for: IRON, TIBC, IRONPCTSAT (Iron and TIBC)  No results found for: FERRITIN  Urinalysis    Component Value Date/Time   COLORURINE YELLOW 07/24/2014 [place] 07/24/2014 1647  LABSPEC 1.015 07/24/2014 1647   PHURINE 5.5 07/24/2014 1647   GLUCOSEU NEGATIVE 07/24/2014 1647   HGBUR NEGATIVE 07/24/2014 [place] 07/24/2014 [place] 07/24/2014 1647   PROTEINUR NEG 11/14/2011 1220   UROBILINOGEN 0.2 07/24/2014 1647   NITRITE NEGATIVE 07/24/2014 1647   LEUKOCYTESUR NEGATIVE 07/24/2014 1647     STUDIES: Mr Breast Bilateral W Wo Contrast Inc Cad  Result  Date: 02/05/2019 CLINICAL DATA:  Patient with history of right nipple redness per outside report. MRI recommended for further evaluation. Outside mammogram and ultrasound reported as negative. EXAM: BILATERAL BREAST MRI WITH AND WITHOUT CONTRAST TECHNIQUE: Multiplanar, multisequence MR images of both breasts were obtained prior to and following the intravenous administration of 6 ml of Gadavist Three-dimensional MR images were rendered by post-processing of the original MR data on an independent workstation. The three-dimensional MR images were interpreted, and findings are reported in the following complete MRI report for this study. Three dimensional images were evaluated at the independent DynaCad workstation COMPARISON:  Previous exam(s). FINDINGS: Breast composition: c. Heterogeneous fibroglandular tissue. Background parenchymal enhancement: Moderate. Right breast: Within the superior central right breast middle depth there is a 3.9 x 2.6 cm area of non mass enhancement (image 53; series 12) which extends cranially and medially (image 41; series 12). Within the lower inner right breast there is a 4.5 x 2.8 cm area of non mass enhancement (image 76-93; series 9). There is asymmetric enhancement of the right nipple. Left breast: No mass or abnormal enhancement. Lymph nodes: No abnormal appearing lymph nodes. Ancillary findings:  None. IMPRESSION: 1. Nonspecific non mass enhancement within the central superior right breast. 2. Nonspecific non mass enhancement within the lower inner right breast. 3. Asymmetric enhancement of the right nipple. RECOMMENDATION: 1. MRI guided core needle biopsy non mass enhancement superior central right breast. 2. MRI guided core needle biopsy non mass enhancement lower inner right breast. 3. Clinical evaluation for appearance of the right nipple given the asymmetric enhancement. Consider surgical biopsy of the nipple to exclude the possibility of Paget's disease given reported  clinical findings as well as asymmetric enhancement on MRI. BI-RADS CATEGORY  4: Suspicious. Electronically Signed   By: Lovey Newcomer M.D.   On: 02/05/2019 16:03   Mm Clip Placement Right  Result Date: 02/21/2019 CLINICAL DATA:  Status post MRI guided biopsy 2 areas of non mass enhancement in the right breast. EXAM: DIAGNOSTIC RIGHT MAMMOGRAM POST MRI BIOPSY COMPARISON:  Previous exam(s). FINDINGS: Mammographic images were obtained following MRI guided biopsy of 2 areas of non mass enhancement in the right breast. The cylinder shaped biopsy clip, which localizes biopsy #1, lies in the upper outer quadrant in the expected location the 11:30 o'clock area of non mass enhancement. The dumbbell shaped biopsy clip, which localizes biopsy #2, lies in the lower slightly outer aspect of the right breast in the expected location of the 4 o'clock lesion. IMPRESSION: Well-positioned biopsy clips following MRI guided biopsy of 2 areas of non mass enhancement in the right breast. Final Assessment: Post Procedure Mammograms for Marker Placement Electronically Signed   By: Lajean Manes M.D.   On: 02/21/2019 11:16   Mr Rt Breast Bx Johnella Moloney Dev 1st Lesion Image Bx Spec Mr Guide  Addendum Date: 02/25/2019   ADDENDUM REPORT: 02/25/2019 14:18 ADDENDUM: Pathology revealed HIGH GRADE MAMMARY CARCINOMA IN SITU of the Right breast, 11:30 o'clock. Pathology revealed HIGH GRADE MAMMARY CARCINOMA IN SITU, MICROINVASION CANNOT BE EXCLUDED, of the Right  breast, 4 o'clock. E-Cadherin is positive in both specimens consistent with a ductal phenotype. This was found to be concordant by Dr. Lajean Manes. Pathology results were discussed with the patient by telephone by Dr. Johnnette Gourd of [facility] in [place], [place]. The patient reported doing well after the biopsies with tenderness at the sites. Post biopsy instructions and care were reviewed and questions were answered. The patient was encouraged to call [facility] Mammography for any  additional concerns. Dr. Isaiah Blakes will arrange a surgical referral and any followup care that is recommended. Pathology results reported by Terie Purser, RN on 02/25/2019. Electronically Signed   By: Lajean Manes M.D.   On: 02/25/2019 14:18   Result Date: 02/25/2019 CLINICAL DATA:  Patient presents for MRI guided biopsy of areas of abnormal non mass enhancement in the right breast. EXAM: MRI GUIDED CORE NEEDLE BIOPSY OF THE RIGHT BREAST TECHNIQUE: Multiplanar, multisequence MR imaging of the right breast was performed both before and after administration of intravenous contrast. CONTRAST:  6 mL of Gadavist COMPARISON:  Previous exams. FINDINGS: I met with the patient, and we discussed the procedure of MRI guided biopsy, including risks, benefits, and alternatives. Specifically, we discussed the risks of infection, bleeding, tissue injury, clip migration, and inadequate sampling. Informed, written consent was given. The usual time out protocol was performed immediately prior to the procedure. Lesion #1: 11:30 o'clock; UOQ. Using sterile technique, 1% Lidocaine, MRI guidance, and a 9 gauge vacuum assisted device, biopsy was performed of the area of non mass enhancement in the 11:30 o'clock position of the upper-outer quadrant using a lateral approach. At the conclusion of the procedure, a cylinder shaped tissue marker clip was deployed into the biopsy cavity. Lesion #2: 4 o'clock; LIQ. Using sterile technique, 1% Lidocaine, MRI guidance, and a 9 gauge vacuum assisted device, biopsy was performed of the area of non mass enhancement in the 4 o'clock position of the lower outer quadrant using a lateral approach. At the conclusion of the procedure, a dumbbell shaped tissue marker clip was deployed into the biopsy cavity. Follow-up 2-view mammogram was performed and dictated separately. IMPRESSION: MRI guided biopsy of of 2 areas of non mass enhancement in the right breast. No apparent complications. Electronically Signed:  By: Lajean Manes M.D. On: 02/21/2019 11:20   Mr Rt Breast Bx W Loc Dev Ea Add Lesion Image Bx Spec Mr Guide  Addendum Date: 02/25/2019   ADDENDUM REPORT: 02/25/2019 14:18 ADDENDUM: Pathology revealed HIGH GRADE MAMMARY CARCINOMA IN SITU of the Right breast, 11:30 o'clock. Pathology revealed HIGH GRADE MAMMARY CARCINOMA IN SITU, MICROINVASION CANNOT BE EXCLUDED, of the Right breast, 4 o'clock. E-Cadherin is positive in both specimens consistent with a ductal phenotype. This was found to be concordant by Dr. Lajean Manes. Pathology results were discussed with the patient by telephone by Dr. Johnnette Gourd of [place] in [place], [place]. The patient reported doing well after the biopsies with tenderness at the sites. Post biopsy instructions and care were reviewed and questions were answered. The patient was encouraged to call [facility] Mammography for any additional concerns. Dr. Isaiah Blakes will arrange a surgical referral and any followup care that is recommended. Pathology results reported by Terie Purser, RN on 02/25/2019. Electronically Signed   By: Lajean Manes M.D.   On: 02/25/2019 14:18   Result Date: 02/25/2019 CLINICAL DATA:  Patient presents for MRI guided biopsy of areas of abnormal non mass enhancement in the right breast. EXAM: MRI GUIDED CORE NEEDLE BIOPSY OF THE RIGHT BREAST  TECHNIQUE: Multiplanar, multisequence MR imaging of the right breast was performed both before and after administration of intravenous contrast. CONTRAST:  6 mL of Gadavist COMPARISON:  Previous exams. FINDINGS: I met with the patient, and we discussed the procedure of MRI guided biopsy, including risks, benefits, and alternatives. Specifically, we discussed the risks of infection, bleeding, tissue injury, clip migration, and inadequate sampling. Informed, written consent was given. The usual time out protocol was performed immediately prior to the procedure. Lesion #1: 11:30 o'clock; UOQ. Using sterile technique, 1%  Lidocaine, MRI guidance, and a 9 gauge vacuum assisted device, biopsy was performed of the area of non mass enhancement in the 11:30 o'clock position of the upper-outer quadrant using a lateral approach. At the conclusion of the procedure, a cylinder shaped tissue marker clip was deployed into the biopsy cavity. Lesion #2: 4 o'clock; LIQ. Using sterile technique, 1% Lidocaine, MRI guidance, and a 9 gauge vacuum assisted device, biopsy was performed of the area of non mass enhancement in the 4 o'clock position of the lower outer quadrant using a lateral approach. At the conclusion of the procedure, a dumbbell shaped tissue marker clip was deployed into the biopsy cavity. Follow-up 2-view mammogram was performed and dictated separately. IMPRESSION: MRI guided biopsy of of 2 areas of non mass enhancement in the right breast. No apparent complications. Electronically Signed: By: Lajean Manes M.D. On: 02/21/2019 11:20    ELIGIBLE FOR AVAILABLE RESEARCH PROTOCOL: no  ASSESSMENT: 68 y.o. [place] woman status post right breast biopsy x2 on 02/21/2019 for multicentric ductal carcinoma in situ, grade 3, estrogen receptor positive  (1) definitive surgery pending  (2) consider prophylactic antiestrogens  (3) genetics testing drawn 03/05/2019  (4) possible coagulopathy being worked up  PLAN: I spent approximately 60 minutes face to face with Ronni with more than 50% of that time spent in counseling and coordination of care. Specifically we reviewed the biology of the patient's diagnosis and the specifics of her situation.  Hodaya understands that in noninvasive ductal carcinoma, also called ductal carcinoma in situ ("DCIS") the breast cancer cells remain trapped in the ducts were they started. They cannot travel to a vital organ. For that reason these cancers in themselves are not life-threatening.  If the whole breast is removed then all the ducts are removed and since the cancer cells are trapped in  the ducts, the cure rate with mastectomy for noninvasive breast cancer is approximately 99%. Nevertheless we generally recommend lumpectomy, because there is no survival advantage to mastectomy.  However in this case the extent of tumor is such mastectomy is really the only choice.  The patient is interested in pursuing reconstruction.    While antiestrogens will not have a role in the treatment of this cancer, they could be taken prophylactically.  They cut the risk of developing a new cancer in half.  However Pam's risk is very low, it would be 1/2 %/year.  Accordingly we are unlikely to proceed to prophylactic antiestrogens once she recovers from the current treatments, but in any case we will discuss this further when she returns to see me in approximately 3 months.  The bleeding history is not suggestive of von Willebrand's disease but there may be an issue with fibrinolysis and I will evaluate this further with some additional lab work next week.  Also Pam had lab work for genetics testing done today. In patients who carry a deleterious mutation [for example in a  BRCA gene], the risk of a new  breast cancer developing in the future may be sufficiently great that the patient may choose bilateral mastectomies. However if she wishes to keep her breasts in that situation it is safe to do so. That would require intensified screening, which generally means not only yearly mammography but a yearly breast MRI as well.   Shavonda has a good understanding of the overall plan. She agrees with it. She knows the goal of treatment in her case is cure. She will call with any problems that may develop before her next visit here.   Chauncey Cruel, MD   03/05/2019 11:12 AM Medical Oncology and Hematology [facility] [address] [place], [place] [address] Tel. [phone]    Fax. [phone]   This document serves as a record of services personally performed by Lurline Del, MD. It was  created on his behalf by Wilburn Mylar, a trained medical scribe. The creation of this record is based on the scribe's personal observations and the provider's statements to them.   I, Lurline Del MD, have reviewed the above documentation for accuracy and completeness, and I agree with the above.

## 2019-03-05 ENCOUNTER — Encounter: Payer: Self-pay | Admitting: Oncology

## 2019-03-05 ENCOUNTER — Ambulatory Visit
Admission: RE | Admit: 2019-03-05 | Discharge: 2019-03-05 | Disposition: A | Discharge status: Home / Self Care | Payer: Medicare Other | Source: Ambulatory Visit | Attending: Radiation Oncology | Admitting: Radiation Oncology

## 2019-03-05 ENCOUNTER — Other Ambulatory Visit: Payer: Self-pay

## 2019-03-05 ENCOUNTER — Inpatient Hospital Stay: Payer: Medicare Other | Attending: Oncology | Admitting: Oncology

## 2019-03-05 ENCOUNTER — Encounter: Payer: Self-pay | Admitting: Radiation Oncology

## 2019-03-05 ENCOUNTER — Ambulatory Visit: Payer: Medicare Other | Attending: General Surgery | Admitting: Physical Therapy

## 2019-03-05 ENCOUNTER — Encounter: Payer: Self-pay | Admitting: Physical Therapy

## 2019-03-05 ENCOUNTER — Inpatient Hospital Stay: Payer: Medicare Other

## 2019-03-05 ENCOUNTER — Other Ambulatory Visit: Payer: Self-pay | Admitting: General Surgery

## 2019-03-05 VITALS — BP 122/61 | HR 62 | Temp 98.9°F | Resp 17 | Ht 68.0 in | Wt 135.9 lb

## 2019-03-05 DIAGNOSIS — R58 Hemorrhage, not elsewhere classified: Secondary | ICD-10-CM | POA: Diagnosis not present

## 2019-03-05 DIAGNOSIS — Z170 Estrogen receptor positive status [ER+]: Secondary | ICD-10-CM | POA: Diagnosis not present

## 2019-03-05 DIAGNOSIS — M069 Rheumatoid arthritis, unspecified: Secondary | ICD-10-CM

## 2019-03-05 DIAGNOSIS — Z800 Family history of malignant neoplasm of digestive organs: Secondary | ICD-10-CM | POA: Insufficient documentation

## 2019-03-05 DIAGNOSIS — R293 Abnormal posture: Secondary | ICD-10-CM

## 2019-03-05 DIAGNOSIS — D0511 Intraductal carcinoma in situ of right breast: Secondary | ICD-10-CM

## 2019-03-05 DIAGNOSIS — C50311 Malignant neoplasm of lower-inner quadrant of right female breast: Secondary | ICD-10-CM | POA: Insufficient documentation

## 2019-03-05 DIAGNOSIS — Z803 Family history of malignant neoplasm of breast: Secondary | ICD-10-CM | POA: Insufficient documentation

## 2019-03-05 DIAGNOSIS — C50811 Malignant neoplasm of overlapping sites of right female breast: Secondary | ICD-10-CM | POA: Diagnosis not present

## 2019-03-05 DIAGNOSIS — Z862 Personal history of diseases of the blood and blood-forming organs and certain disorders involving the immune mechanism: Secondary | ICD-10-CM

## 2019-03-05 LAB — CBC WITH DIFFERENTIAL (CANCER CENTER ONLY)
Abs Immature Granulocytes: 0.01 10*3/uL (ref 0.00–0.07)
Basophils Absolute: 0.1 10*3/uL (ref 0.0–0.1)
Basophils Relative: 1 %
Eosinophils Absolute: 0.3 10*3/uL (ref 0.0–0.5)
Eosinophils Relative: 5 %
HCT: 38 % (ref 36.0–46.0)
Hemoglobin: 13 g/dL (ref 12.0–15.0)
Immature Granulocytes: 0 %
Lymphocytes Relative: 23 %
Lymphs Abs: 1.4 10*3/uL (ref 0.7–4.0)
MCH: 34.6 pg — ABNORMAL HIGH (ref 26.0–34.0)
MCHC: 34.2 g/dL (ref 30.0–36.0)
MCV: 101.1 fL — ABNORMAL HIGH (ref 80.0–100.0)
Monocytes Absolute: 1 10*3/uL (ref 0.1–1.0)
Monocytes Relative: 16 %
Neutro Abs: 3.4 10*3/uL (ref 1.7–7.7)
Neutrophils Relative %: 55 %
Platelet Count: 258 10*3/uL (ref 150–400)
RBC: 3.76 MIL/uL — ABNORMAL LOW (ref 3.87–5.11)
RDW: 13.3 % (ref 11.5–15.5)
WBC Count: 6.1 10*3/uL (ref 4.0–10.5)
nRBC: 0 % (ref 0.0–0.2)

## 2019-03-05 LAB — CMP (CANCER CENTER ONLY)
ALT: 27 U/L (ref 0–44)
AST: 25 U/L (ref 15–41)
Albumin: 3.6 g/dL (ref 3.5–5.0)
Alkaline Phosphatase: 52 U/L (ref 38–126)
Anion gap: 8 (ref 5–15)
BUN: 19 mg/dL (ref 8–23)
CO2: 29 mmol/L (ref 22–32)
Calcium: 8.8 mg/dL — ABNORMAL LOW (ref 8.9–10.3)
Chloride: 104 mmol/L (ref 98–111)
Creatinine: 0.88 mg/dL (ref 0.44–1.00)
GFR, Est AFR Am: >60 mL/min (ref >60)
GFR, Estimated: >60 mL/min (ref >60)
Glucose, Bld: 81 mg/dL (ref 70–99)
Potassium: 4.3 mmol/L (ref 3.5–5.1)
Sodium: 141 mmol/L (ref 135–145)
Total Bilirubin: 0.4 mg/dL (ref 0.3–1.2)
Total Protein: 6.1 g/dL — ABNORMAL LOW (ref 6.5–8.1)

## 2019-03-05 NOTE — Patient Instructions (Signed)

## 2019-03-05 NOTE — Therapy (Signed)
[place], [place], [address] Phone: [phone]   Fax:  [phone]  Physical Therapy Evaluation  Patient Details  Name: Patricia Long MRN: [id number] Date of Birth: 10-03-1950 Referring Provider (PT): Dr. Stark Klein   Encounter Date: 03/05/2019  PT End of Session - 03/05/19 1115    Visit Number  1    Number of Visits  2    Date for PT Re-Evaluation  04/30/19    PT Start Time  1027    PT Stop Time  8889   Also saw pt from 1054-1110 for a total of 24 minutes   PT Time Calculation (min)  8 min    Activity Tolerance  Patient tolerated treatment well       Past Medical History:  Diagnosis Date  . Basal cell carcinoma ~ 2013   "legs"  . Chronic bronchitis ([place])    "I've had it several times but not q yr" (01/13/2015)  . Endometriosis   . Fibroid   . Hypertension   . Irritable bowel syndrome   . Pneumonia 1970's X 1; ~ 2013  . PONV (postoperative nausea and vomiting)   . Rheumatoid arthritis(714.0)     Past Surgical History:  Procedure Laterality Date  . BASAL CELL CARCINOMA EXCISION  ~ 2013   "legs"  . BREAST CYST EXCISION Bilateral 1992   NODULE EXCISION OF RIGHT AND LEFT BREAST  . FOOT SURGERY Bilateral ~ 2006-2008   "reconstruction"  . JOINT REPLACEMENT Left 2009   WRIST AND FINGER   . KNEE ARTHROSCOPY Right 1980's  . PELVIC LAPAROSCOPY  '82 AND '87  . TOTAL ABDOMINAL HYSTERECTOMY  1997   TAH/BSO    There were no vitals filed for this visit.   Subjective Assessment - 03/05/19 1050    Subjective  Patient reports she is here today to be seen by her medical team for her newly diagnosed right breast cancer.    Pertinent History  Patient was diagnosed on 01/28/2019 with right high grade DCIS. It measures 4.5 cm and is located in the lower inner quadrant. It is ER/PR positive. She has rheumatoid arthritis with primary involvement in her hands and feet.    Patient Stated Goals   Reduce lymphedema risk and learn post op shoulder ROM HEP    Currently in Pain?  No/denies   Lives with some level of pain due to RA        [facility] PT Assessment - 03/05/19 0001      Assessment   Medical Diagnosis  Right breast cancer    Referring Provider (PT)  Dr. Stark Klein    Onset Date/Surgical Date  01/28/19    Hand Dominance  Right    Prior Therapy  none related to breast CA      Precautions   Precautions  Fall    Precaution Comments  Reports she has had some falls related to helping her husband in/out of her wheelchair      Balance Screen   Has the patient fallen in the past 6 months  Yes    How many times?  --   Has fallen several times helping husband in/out of his w/c   Has the patient had a decrease in activity level because of a fear of falling?   No    Is the patient reluctant to leave their home because of a fear of falling?   No      Home Environment  Living Environment  Private residence    Living Arrangements  Spouse/significant other   Husband currently at [place] [facility]  Family      Prior Function   Level of [place]  Retired    Leisure  She is not exercising      Cognition   Overall Cognitive Status  Within Functional Limits for tasks assessed      Posture/Postural Control   Posture/Postural Control  Postural limitations    Postural Limitations  Rounded Shoulders;Forward head      ROM / Strength   AROM / PROM / Strength  AROM;Strength      AROM   AROM Assessment Site  Shoulder;Cervical    Right/Left Shoulder  Right;Left    Right Shoulder Extension  53 Degrees    Right Shoulder Flexion  159 Degrees    Right Shoulder ABduction  178 Degrees    Right Shoulder Internal Rotation  75 Degrees    Right Shoulder External Rotation  89 Degrees    Left Shoulder Extension  58 Degrees    Left Shoulder Flexion  160 Degrees    Left Shoulder ABduction  180 Degrees    Left Shoulder Internal  Rotation  88 Degrees    Left Shoulder External Rotation  89 Degrees    Cervical Flexion  WNL    Cervical Extension  WNL    Cervical - Right Side Bend  WNL    Cervical - Left Side Bend  WNL    Cervical - Right Rotation  WNL    Cervical - Left Rotation  WNL      Strength   Overall Strength  Within functional limits for tasks performed        LYMPHEDEMA/ONCOLOGY QUESTIONNAIRE - 03/05/19 1112      Type   Cancer Type  Right breast cancer      Lymphedema Assessments   Lymphedema Assessments  Upper extremities      Right Upper Extremity Lymphedema   10 cm Proximal to Olecranon Process  23.9 cm    Olecranon Process  21.8 cm    10 cm Proximal to Ulnar Styloid Process  18.9 cm    Just Proximal to Ulnar Styloid Process  13.6 cm    Across Hand at [employer]  16.9 cm    At [place] of 2nd Digit  5.7 cm      Left Upper Extremity Lymphedema   10 cm Proximal to Olecranon Process  24.3 cm    Olecranon Process  21.8 cm    10 cm Proximal to Ulnar Styloid Process  18 cm    Just Proximal to Ulnar Styloid Process  23.2 cm    Across Hand at [employer]  16.2 cm    At [place] of 2nd Digit  5.8 cm          Quick Dash - 03/05/19 0001    Open a tight or new jar  Mild difficulty    Do heavy household chores (wash walls, wash floors)  No difficulty    Carry a shopping bag or briefcase  Mild difficulty    Wash your back  No difficulty    Use a knife to cut food  No difficulty    Recreational activities in which you take some force or impact through your arm, shoulder, or hand (golf, hammering, tennis)  Mild difficulty    During the past week, to what extent has  your arm, shoulder or hand problem interfered with your normal social activities with family, friends, neighbors, or groups?  Not at all    During the past week, to what extent has your arm, shoulder or hand problem limited your work or other regular daily activities  Slightly    Arm, shoulder, or hand pain.  None    Tingling (pins  and needles) in your arm, shoulder, or hand  None    Difficulty Sleeping  No difficulty    DASH Score  9.09 %        Objective measurements completed on examination: See above findings.     Patient was instructed today in a home exercise program today for post op shoulder range of motion. These included active assist shoulder flexion in sitting, scapular retraction, wall walking with shoulder abduction, and hands behind head external rotation.  She was encouraged to do these twice a day, holding 3 seconds and repeating 5 times when permitted by her physician.      PT Education - 03/05/19 1113    Education Details  Lymphedema risk reduction and post op shoulder ROM HEP    Person(s) Educated  Patient    Methods  Explanation;Demonstration;Handout    Comprehension  Returned demonstration;Verbalized understanding          PT Long Term Goals - 03/05/19 1124      PT LONG TERM GOAL #1   Title  Patient will demonstrate she has returned to baseline related to shoulder ROM and function post operatively.    Time  8    Period  Weeks    Status  New      Breast Clinic Goals - 03/05/19 1124      Patient will be able to verbalize understanding of pertinent lymphedema risk reduction practices relevant to her diagnosis specifically related to skin care.   Time  1    Period  Days    Status  Achieved      Patient will be able to return demonstrate and/or verbalize understanding of the post-op home exercise program related to regaining shoulder range of motion.   Time  1    Period  Days    Status  Achieved      Patient will be able to verbalize understanding of the importance of attending the postoperative After Breast Cancer Class for further lymphedema risk reduction education and therapeutic exercise.   Time  1    Period  Days    Status  Achieved            Plan - 03/05/19 1115    Clinical Impression Statement  Patient was diagnosed on 01/28/2019 with right high grade DCIS. It  measures 4.5 cm and is located in the lower inner quadrant. It is ER/PR positive. She has rheumatoid arthritis with primary involvement in her hands and feet. Her multidisciplinary medical team met prior to her assessments to determine a recommended treatment plan. She is planning to have a right mastectomy and sentinel node biopsy followed by anti-estrogen therapy. She may consider breast reconstruction although may not be a candidate due to her RA. Her husband who is the sole caretaker for is currently at [facility] recovering from aortic aneurysm surgery. He had a previous CVA in 2017 and requires max assist for wheelchair transfers. She will benefit from post op PT to regain shoulder ROM and strength as she needs full function to assist her husband.    Personal Factors and Comorbidities  Comorbidity 1;Social Background    Comorbidities  Rheumatoid arthritis    Examination-Participation Restrictions  Interpersonal Relationship   Caregiver for her husband   Stability/Clinical Decision Making  Stable/Uncomplicated    Clinical Decision Making  Low    Rehab Potential  Excellent    PT Frequency  --   Eval and 1 f/u visit   PT Treatment/Interventions  ADLs/Self Care Home Management;Patient/family education;Therapeutic exercise    PT Next Visit Plan  Will reassess 3-4 weeks post op to determine needs    PT Home Exercise Plan  Post op shoulder ROM HEP    Consulted and Agree with Plan of Care  Patient       Patient will benefit from skilled therapeutic intervention in order to improve the following deficits and impairments:  Decreased range of motion, Decreased knowledge of precautions, Postural dysfunction, Pain, Impaired UE functional use  Visit Diagnosis: Malignant neoplasm of lower-inner quadrant of right breast of female, estrogen receptor positive ([place]) - Plan: PT plan of care cert/re-cert  Abnormal posture - Plan: PT plan of care cert/re-cert   Patient will follow up at  outpatient cancer rehab 3-4 weeks following surgery.  If the patient requires physical therapy at that time, a specific plan will be dictated and sent to the referring physician for approval. The patient was educated today on appropriate basic range of motion exercises to begin post operatively and the importance of attending the After Breast Cancer class following surgery.  Patient was educated today on lymphedema risk reduction practices as it pertains to recommendations that will benefit the patient immediately following surgery.  She verbalized good understanding.      Problem List Patient Active Problem List   Diagnosis Date Noted  . Ductal carcinoma in situ (DCIS) of right breast 02/26/2019  . Neck pain on right side 12/28/2015  . Postconcussion syndrome 10/25/2015  . Aortic arch atherosclerosis ([place]) 03/08/2015  . Hypertension 01/13/2015  . Chest pain 01/13/2015  . Labile hypertension 01/13/2015  . Rheumatoid arthritis ([place]) 08/29/2013  . ABDOMINAL PAIN, EPIGASTRIC 08/02/2009  . GERD 07/30/2009  . CONSTIPATION 07/30/2009  . IRRITABLE BOWEL SYNDROME 07/30/2009  . NAUSEA AND VOMITING 07/30/2009  . Nausea without vomiting 07/30/2009  . ABDOMINAL PAIN, GENERALIZED 07/30/2009   Annia Friendly, PT 03/05/19 11:28 AM  [place], [place], [address] Phone: [phone]   Fax:  [phone]  Name: Patricia Long MRN: [id number] Date of Birth: 1951-04-05

## 2019-03-05 NOTE — Progress Notes (Signed)
Radiation Oncology         [phone] ________________________________  Initial outpatient Consultation - patient was seen in person  Name: Patricia Long MRN: [id number]  Date: 03/05/2019  DOB: 03/25/1951  GU:YQIHK, Herbie [place], MD  Stark Klein, MD   REFERRING PHYSICIAN: Stark Klein, MD  DIAGNOSIS:    ICD-10-CM   1. Ductal carcinoma in situ (DCIS) of right breast D05.11    Cancer Staging Ductal carcinoma in situ (DCIS) of right breast Staging form: Breast, AJCC 8th Edition - Clinical stage from 03/05/2019: Stage 0 (cTis (DCIS), cN0, cM0, ER+, PR+, HER2: Not Assessed) - Unsigned   CHIEF COMPLAINT: Here to discuss management of right breast DCIS  HISTORY OF PRESENT ILLNESS::Patricia Long is a 68 y.o. female who presented with right nipple redness and right areolar tenderness for 1 year which worsened more recently. Mammography and ultrasound did not reveal any clear abnormality.  She then underwent an MRI of her breasts which showed in the right breast a 3.9 cm region of non-masslike enhancement in the superior central breast.  4.5 cm of non-masslike enhancement in the lower inner quadrant.  And there is enhancement of the right nipple.  The lymph nodes in left breast were unremarkable.  She underwent 2 biopsies of the right breast both of which showed high-grade DCIS.  There was some possible microinvasion.  The DCIS is ER/ PR positive.  The patient reports immunosuppression related to rheumatoid arthritis.  PREVIOUS RADIATION THERAPY: No  PAST MEDICAL HISTORY:  has a past medical history of Basal cell carcinoma (~ 2013), Chronic bronchitis ([place]), Endometriosis, Fibroid, Hypertension, Irritable bowel syndrome, Pneumonia (1970's X 1; ~ 2013), PONV (postoperative nausea and vomiting), and Rheumatoid arthritis(714.0).    PAST SURGICAL HISTORY: Past Surgical History:  Procedure Laterality Date   BASAL CELL CARCINOMA EXCISION  ~ 2013   "legs"   BREAST CYST EXCISION  Bilateral 1992   NODULE EXCISION OF RIGHT AND LEFT BREAST   FOOT SURGERY Bilateral ~ 2006-2008   "reconstruction"   JOINT REPLACEMENT Left 2009   WRIST AND FINGER    KNEE ARTHROSCOPY Right 1980's   PELVIC LAPAROSCOPY  '82 AND '87   TOTAL ABDOMINAL HYSTERECTOMY  1997   TAH/BSO    FAMILY HISTORY: family history includes Breast cancer (age of onset: 16) in her maternal aunt; Colon cancer in her maternal aunt and paternal uncle; Heart attack in her father and mother; Heart disease in her father and mother.  SOCIAL HISTORY:  reports that she has never smoked. She has never used smokeless tobacco. She reports current alcohol use of about 1.0 standard drinks of alcohol per week. She reports that she does not use drugs.  ALLERGIES: Prochlorperazine edisylate; Prochlorperazine edisylate; Codeine; Prochlorperazine; and Tape  MEDICATIONS:  Current Outpatient Medications  Medication Sig Dispense Refill   amLODipine (NORVASC) 10 MG tablet TAKE 1 TABLET BY MOUTH  DAILY 90 tablet 3   betamethasone valerate ointment (VALISONE) 0.1 % Apply 1 application topically 2 (two) times daily. 30 g 0   cetirizine (ZYRTEC) 10 MG tablet Take 10 mg by mouth daily as needed (remicaide infusion).     Coenzyme Q10 200 MG capsule Take 1 capsule (200 mg total) by mouth daily. 90 capsule 3   estradiol (ESTRACE) 0.5 MG tablet Take 1 tablet (0.5 mg total) by mouth daily. 90 tablet 4   folic acid (FOLVITE) 1 MG tablet Take 1 mg by mouth daily.     hydroxychloroquine (PLAQUENIL) 200 MG tablet Take 200  mg by mouth daily.     inFLIXimab (REMICADE) 100 MG injection Inject 100 mg into the vein every 8 (eight) weeks.     Methotrexate, Anti-Rheumatic, (METHOTREXATE, PF, [place]) Inject 20 mg into the skin once a week.     Multiple Vitamin (MULTIVITAMIN) tablet Take 1 tablet by mouth daily.     rosuvastatin (CRESTOR) 10 MG tablet TAKE 1 TABLET BY MOUTH  DAILY 90 tablet 3   valsartan (DIOVAN) 80 MG tablet Take 1 tablet  by mouth every morning.     No current facility-administered medications for this encounter.     REVIEW OF SYSTEMS: as above  PHYSICAL EXAM:   Vitals - 1 value per visit 3/53/6144  SYSTOLIC 315  DIASTOLIC 61  Pulse 62  Temperature 98.9  Respirations 17  Weight (lb) 135.9  Height 5' 8"  BMI 20.66  VISIT REPORT    General: Alert and oriented, in no acute distress Skin is notable for bruising over the right breast related to recent biopsy and erythematous right nipple/areola Psychiatric: Judgment and insight are intact. Affect is appropriate. Breasts: The right areolar complex is erythematous and slightly swollen.  No current discharge or crusting of the nipple; there is bruising from biopsy of the right breast   ECOG = 0  0 - Asymptomatic (Fully active, able to carry on all predisease activities without restriction)  1 - Symptomatic but completely ambulatory (Restricted in physically strenuous activity but ambulatory and able to carry out work of a light or sedentary nature. For example, light housework, office work)  2 - Symptomatic, <50% in bed during the day (Ambulatory and capable of all self care but unable to carry out any work activities. Up and about more than 50% of waking hours)  3 - Symptomatic, >50% in bed, but not bedbound (Capable of only limited self-care, confined to bed or chair 50% or more of waking hours)  4 - Bedbound (Completely disabled. Cannot carry on any self-care. Totally confined to bed or chair)  5 - Death   Eustace Pen MM, Creech RH, Tormey DC, et al. [phone]). "Toxicity and response criteria of the [facility]". [place] Oncol. 5 (6): 649-55   LABORATORY DATA:  Lab Results  Component Value Date   WBC 6.1 03/05/2019   HGB 13.0 03/05/2019   HCT 38.0 03/05/2019   MCV 101.1 (H) 03/05/2019   PLT 258 03/05/2019   CMP     Component Value Date/Time   NA 141 03/05/2019 0837   NA 142 09/21/2017 1119   K 4.3 03/05/2019 0837    CL 104 03/05/2019 0837   CO2 29 03/05/2019 0837   GLUCOSE 81 03/05/2019 0837   BUN 19 03/05/2019 0837   BUN 20 09/21/2017 1119   CREATININE 0.88 03/05/2019 0837   CALCIUM 8.8 (L) 03/05/2019 0837   PROT 6.1 (L) 03/05/2019 0837   PROT 6.6 09/21/2017 1119   ALBUMIN 3.6 03/05/2019 0837   ALBUMIN 4.3 09/21/2017 1119   AST 25 03/05/2019 0837   ALT 27 03/05/2019 0837   ALKPHOS 52 03/05/2019 0837   BILITOT 0.4 03/05/2019 0837   GFRNONAA >60 03/05/2019 0837   GFRAA >60 03/05/2019 0837         RADIOGRAPHY: Mr Breast Bilateral W Wo Contrast Inc Cad  Result Date: 02/05/2019 CLINICAL DATA:  Patient with history of right nipple redness per outside report. MRI recommended for further evaluation. Outside mammogram and ultrasound reported as negative. EXAM: BILATERAL BREAST MRI WITH AND WITHOUT CONTRAST  TECHNIQUE: Multiplanar, multisequence MR images of both breasts were obtained prior to and following the intravenous administration of 6 ml of Gadavist Three-dimensional MR images were rendered by post-processing of the original MR data on an independent workstation. The three-dimensional MR images were interpreted, and findings are reported in the following complete MRI report for this study. Three dimensional images were evaluated at the independent DynaCad workstation COMPARISON:  Previous exam(s). FINDINGS: Breast composition: c. Heterogeneous fibroglandular tissue. Background parenchymal enhancement: Moderate. Right breast: Within the superior central right breast middle depth there is a 3.9 x 2.6 cm area of non mass enhancement (image 53; series 12) which extends cranially and medially (image 41; series 12). Within the lower inner right breast there is a 4.5 x 2.8 cm area of non mass enhancement (image 76-93; series 9). There is asymmetric enhancement of the right nipple. Left breast: No mass or abnormal enhancement. Lymph nodes: No abnormal appearing lymph nodes. Ancillary findings:  None. IMPRESSION: 1.  Nonspecific non mass enhancement within the central superior right breast. 2. Nonspecific non mass enhancement within the lower inner right breast. 3. Asymmetric enhancement of the right nipple. RECOMMENDATION: 1. MRI guided core needle biopsy non mass enhancement superior central right breast. 2. MRI guided core needle biopsy non mass enhancement lower inner right breast. 3. Clinical evaluation for appearance of the right nipple given the asymmetric enhancement. Consider surgical biopsy of the nipple to exclude the possibility of Paget's disease given reported clinical findings as well as asymmetric enhancement on MRI. BI-RADS CATEGORY  4: Suspicious. Electronically Signed   By: Lovey Newcomer M.D.   On: 02/05/2019 16:03   Mm Clip Placement Right  Result Date: 02/21/2019 CLINICAL DATA:  Status post MRI guided biopsy 2 areas of non mass enhancement in the right breast. EXAM: DIAGNOSTIC RIGHT MAMMOGRAM POST MRI BIOPSY COMPARISON:  Previous exam(s). FINDINGS: Mammographic images were obtained following MRI guided biopsy of 2 areas of non mass enhancement in the right breast. The cylinder shaped biopsy clip, which localizes biopsy #1, lies in the upper outer quadrant in the expected location the 11:30 o'clock area of non mass enhancement. The dumbbell shaped biopsy clip, which localizes biopsy #2, lies in the lower slightly outer aspect of the right breast in the expected location of the 4 o'clock lesion. IMPRESSION: Well-positioned biopsy clips following MRI guided biopsy of 2 areas of non mass enhancement in the right breast. Final Assessment: Post Procedure Mammograms for Marker Placement Electronically Signed   By: Lajean Manes M.D.   On: 02/21/2019 11:16   Mr Rt Breast Bx Johnella Moloney Dev 1st Lesion Image Bx Spec Mr Guide  Addendum Date: 02/25/2019   ADDENDUM REPORT: 02/25/2019 14:18 ADDENDUM: Pathology revealed HIGH GRADE MAMMARY CARCINOMA IN SITU of the Right breast, 11:30 o'clock. Pathology revealed HIGH GRADE  MAMMARY CARCINOMA IN SITU, MICROINVASION CANNOT BE EXCLUDED, of the Right breast, 4 o'clock. E-Cadherin is positive in both specimens consistent with a ductal phenotype. This was found to be concordant by Dr. Lajean Manes. Pathology results were discussed with the patient by telephone by Dr. Johnnette Gourd of [place] in [place], [place]. The patient reported doing well after the biopsies with tenderness at the sites. Post biopsy instructions and care were reviewed and questions were answered. The patient was encouraged to call [facility] for any additional concerns. Dr. Isaiah Blakes will arrange a surgical referral and any followup care that is recommended. Pathology results reported by Terie Purser, RN on 02/25/2019. Electronically Signed   By: Shanon Brow  Ormond M.D.   On: 02/25/2019 14:18   Result Date: 02/25/2019 CLINICAL DATA:  Patient presents for MRI guided biopsy of areas of abnormal non mass enhancement in the right breast. EXAM: MRI GUIDED CORE NEEDLE BIOPSY OF THE RIGHT BREAST TECHNIQUE: Multiplanar, multisequence MR imaging of the right breast was performed both before and after administration of intravenous contrast. CONTRAST:  6 mL of Gadavist COMPARISON:  Previous exams. FINDINGS: I met with the patient, and we discussed the procedure of MRI guided biopsy, including risks, benefits, and alternatives. Specifically, we discussed the risks of infection, bleeding, tissue injury, clip migration, and inadequate sampling. Informed, written consent was given. The usual time out protocol was performed immediately prior to the procedure. Lesion #1: 11:30 o'clock; UOQ. Using sterile technique, 1% Lidocaine, MRI guidance, and a 9 gauge vacuum assisted device, biopsy was performed of the area of non mass enhancement in the 11:30 o'clock position of the upper-outer quadrant using a lateral approach. At the conclusion of the procedure, a cylinder shaped tissue marker clip was deployed into the biopsy cavity.  Lesion #2: 4 o'clock; LIQ. Using sterile technique, 1% Lidocaine, MRI guidance, and a 9 gauge vacuum assisted device, biopsy was performed of the area of non mass enhancement in the 4 o'clock position of the lower outer quadrant using a lateral approach. At the conclusion of the procedure, a dumbbell shaped tissue marker clip was deployed into the biopsy cavity. Follow-up 2-view mammogram was performed and dictated separately. IMPRESSION: MRI guided biopsy of of 2 areas of non mass enhancement in the right breast. No apparent complications. Electronically Signed: By: Lajean Manes M.D. On: 02/21/2019 11:20   Mr Rt Breast Bx W Loc Dev Ea Add Lesion Image Bx Spec Mr Guide  Addendum Date: 02/25/2019   ADDENDUM REPORT: 02/25/2019 14:18 ADDENDUM: Pathology revealed HIGH GRADE MAMMARY CARCINOMA IN SITU of the Right breast, 11:30 o'clock. Pathology revealed HIGH GRADE MAMMARY CARCINOMA IN SITU, MICROINVASION CANNOT BE EXCLUDED, of the Right breast, 4 o'clock. E-Cadherin is positive in both specimens consistent with a ductal phenotype. This was found to be concordant by Dr. Lajean Manes. Pathology results were discussed with the patient by telephone by Dr. Johnnette Gourd of [place] in [place], [place]. The patient reported doing well after the biopsies with tenderness at the sites. Post biopsy instructions and care were reviewed and questions were answered. The patient was encouraged to call [facility] for any additional concerns. Dr. Isaiah Blakes will arrange a surgical referral and any followup care that is recommended. Pathology results reported by Terie Purser, RN on 02/25/2019. Electronically Signed   By: Lajean Manes M.D.   On: 02/25/2019 14:18   Result Date: 02/25/2019 CLINICAL DATA:  Patient presents for MRI guided biopsy of areas of abnormal non mass enhancement in the right breast. EXAM: MRI GUIDED CORE NEEDLE BIOPSY OF THE RIGHT BREAST TECHNIQUE: Multiplanar, multisequence MR imaging of the right  breast was performed both before and after administration of intravenous contrast. CONTRAST:  6 mL of Gadavist COMPARISON:  Previous exams. FINDINGS: I met with the patient, and we discussed the procedure of MRI guided biopsy, including risks, benefits, and alternatives. Specifically, we discussed the risks of infection, bleeding, tissue injury, clip migration, and inadequate sampling. Informed, written consent was given. The usual time out protocol was performed immediately prior to the procedure. Lesion #1: 11:30 o'clock; UOQ. Using sterile technique, 1% Lidocaine, MRI guidance, and a 9 gauge vacuum assisted device, biopsy was performed of the area of non mass  enhancement in the 11:30 o'clock position of the upper-outer quadrant using a lateral approach. At the conclusion of the procedure, a cylinder shaped tissue marker clip was deployed into the biopsy cavity. Lesion #2: 4 o'clock; LIQ. Using sterile technique, 1% Lidocaine, MRI guidance, and a 9 gauge vacuum assisted device, biopsy was performed of the area of non mass enhancement in the 4 o'clock position of the lower outer quadrant using a lateral approach. At the conclusion of the procedure, a dumbbell shaped tissue marker clip was deployed into the biopsy cavity. Follow-up 2-view mammogram was performed and dictated separately. IMPRESSION: MRI guided biopsy of of 2 areas of non mass enhancement in the right breast. No apparent complications. Electronically Signed: By: Lajean Manes M.D. On: 02/21/2019 11:20      IMPRESSION/PLAN: Right breast DCIS, high-grade   It was a pleasure meeting the patient today in breast multidisciplinary clinic.  Recommended surgery is a mastectomy.  She is interested in reconstruction.  Plastic surgery referral will be made.  It is unlikely that she will need adjuvant radiotherapy.  I will be available to review her pathology at our upcoming breast tumor boards if there are unexpected findings.  Otherwise, I wished her the  best and I will see her back on an as needed basis.   Following consultation I contacted the multidisciplinary team and nurse navigator to verify/double check if she has been instructed to stop estradiol.  This will be prudent given the estrogen positivity of her DCIS. __________________________________________   Eppie Gibson, MD

## 2019-03-06 ENCOUNTER — Telehealth: Payer: Self-pay | Admitting: Oncology

## 2019-03-06 NOTE — Telephone Encounter (Signed)
I talk with patient regarding schedule  

## 2019-03-07 ENCOUNTER — Telehealth: Payer: Self-pay

## 2019-03-07 NOTE — Telephone Encounter (Signed)
Nutrition  RD working remotely.  Patient seen in [facility] on 6/10.  Called patient to introduce self and service at [facility].  Patient not able to talk as at the hospital waiting on husband to be discharged.  RD will plan to call patient at later date.    Scottie Metayer B. Zenia Resides, [place], [place] Registered Dietitian [phone] (pager)

## 2019-03-10 ENCOUNTER — Other Ambulatory Visit: Payer: Self-pay | Admitting: Oncology

## 2019-03-10 ENCOUNTER — Encounter: Payer: Self-pay | Admitting: General Practice

## 2019-03-10 DIAGNOSIS — D689 Coagulation defect, unspecified: Secondary | ICD-10-CM | POA: Insufficient documentation

## 2019-03-10 NOTE — Progress Notes (Signed)
[place] Psychosocial Distress Screening [place] by phone following Breast Multidisciplinary Clinic to introduce [place] team/resources, reviewing distress screen per protocol.  The patient scored a 1 on the Psychosocial Distress Thermometer which indicates mild distress. Also assessed for distress and other psychosocial needs.   ONCBCN DISTRESS SCREENING 03/10/2019  Screening Type Initial Screening  Distress experienced in past week (1-10) 1  Family Problem type Other (comment)  Emotional problem type Nervousness/Anxiety  Referral to support programs Yes   Patricia Long reports good support from her three siblings and gratitude that, though her husband was just in the hospital for three weeks secondary to aneurysm and hx stroke, he is now in a rehab facility getting help with ADLs that would be hard for pt to provide while she is recovering from her own surgery. Patricia Long understands that this is God's timing in caring for her family and finds comfort and strength in her faith. We talked about how her hx RA surgeries has equipped her with a number of transferable skills for coping with this situation.  Follow up needed: No. [place] Guide referral per pt request for help with tips for preparing to cope with ADLs alone after surgery. Per pt, no other needs at this time, but she knows to contact Team whenever needed/desired. Please also page if needs arise or circumstances change. Thank you.   [place], [place], [facility] Pager [phone] Voicemail [phone]

## 2019-03-11 ENCOUNTER — Telehealth: Payer: Self-pay

## 2019-03-11 DIAGNOSIS — D0511 Intraductal carcinoma in situ of right breast: Secondary | ICD-10-CM | POA: Diagnosis not present

## 2019-03-11 NOTE — Telephone Encounter (Signed)
Nutrition  RD working remotely.   Follow-up phone call to patient regarding nutrition.  Patient confirms did receive nutrition packet in [facility] on 6/10.  Reports she has had a lot going on recently with husband in the hospital and then discharged to rehab.  Patient reports good appetite overall and eats a healthy diet.  Reviewed plant based diet with patient.  No questions at this time.  Patient has contact information and will reach out if needed.  Patricia Long, [place], [place] Registered Dietitian [phone] (pager)

## 2019-03-12 ENCOUNTER — Other Ambulatory Visit: Payer: Self-pay

## 2019-03-12 ENCOUNTER — Inpatient Hospital Stay: Payer: Medicare Other

## 2019-03-12 DIAGNOSIS — Z800 Family history of malignant neoplasm of digestive organs: Secondary | ICD-10-CM | POA: Diagnosis not present

## 2019-03-12 DIAGNOSIS — Z862 Personal history of diseases of the blood and blood-forming organs and certain disorders involving the immune mechanism: Secondary | ICD-10-CM | POA: Diagnosis not present

## 2019-03-12 DIAGNOSIS — D689 Coagulation defect, unspecified: Secondary | ICD-10-CM

## 2019-03-12 DIAGNOSIS — Z170 Estrogen receptor positive status [ER+]: Secondary | ICD-10-CM | POA: Diagnosis not present

## 2019-03-12 DIAGNOSIS — M069 Rheumatoid arthritis, unspecified: Secondary | ICD-10-CM | POA: Diagnosis not present

## 2019-03-12 DIAGNOSIS — Z803 Family history of malignant neoplasm of breast: Secondary | ICD-10-CM | POA: Diagnosis not present

## 2019-03-12 DIAGNOSIS — D0511 Intraductal carcinoma in situ of right breast: Secondary | ICD-10-CM | POA: Diagnosis not present

## 2019-03-12 LAB — DIC (DISSEMINATED INTRAVASCULAR COAGULATION)PANEL
D-Dimer, Quant: 0.31 ug/mL-FEU (ref 0.00–0.50)
Fibrinogen: 233 mg/dL (ref 210–475)
INR: 0.9 (ref 0.8–1.2)
Platelets: 248 10*3/uL (ref 150–400)
Prothrombin Time: 12.4 seconds (ref 11.4–15.2)
Smear Review: NONE SEEN
aPTT: 26 seconds (ref 24–36)

## 2019-03-13 ENCOUNTER — Other Ambulatory Visit: Payer: Self-pay | Admitting: *Deleted

## 2019-03-13 ENCOUNTER — Telehealth: Payer: Self-pay | Admitting: *Deleted

## 2019-03-13 LAB — VON WILLEBRAND PANEL
Coagulation Factor VIII: 88 % (ref 56–140)
Ristocetin Co-factor, Plasma: 86 % (ref 50–200)
Von Willebrand Antigen, Plasma: 90 % (ref 50–200)

## 2019-03-13 LAB — COAG STUDIES INTERP REPORT

## 2019-03-13 MED ORDER — VENLAFAXINE HCL ER 37.5 MG PO CP24: 37.5000 mg | ORAL_CAPSULE | Freq: Every day | ORAL | 2 refills | Status: DC

## 2019-03-13 NOTE — Telephone Encounter (Signed)
Spoke with patient to follow up from [facility].  Patient states she is doing ok except for hot flashes that is keeping her up at night since being off the estrace.  Informed her we could call in some effexor for her to try.  She states she would love to try that.  Rx called into her pharmacy.  Instructed she could start at 37.5mg  and see how that works and we can increase to 75mg  if we need to.  Patient is going to [facility] for a 2nd opinion for plastics.  She wants to use her tissue.  She may also call [facility] to see if she can be seen sooner than a month from now.

## 2019-03-14 LAB — FACTOR 13 ACTIVITY: Factor XIII, Qualitative: NORMAL

## 2019-03-19 DIAGNOSIS — Z170 Estrogen receptor positive status [ER+]: Secondary | ICD-10-CM | POA: Diagnosis not present

## 2019-03-19 DIAGNOSIS — D0511 Intraductal carcinoma in situ of right breast: Secondary | ICD-10-CM | POA: Diagnosis not present

## 2019-03-21 DIAGNOSIS — D0511 Intraductal carcinoma in situ of right breast: Secondary | ICD-10-CM | POA: Diagnosis not present

## 2019-03-25 NOTE — Telephone Encounter (Signed)
Opened in error

## 2019-03-26 DIAGNOSIS — Z1159 Encounter for screening for other viral diseases: Secondary | ICD-10-CM | POA: Diagnosis not present

## 2019-03-26 DIAGNOSIS — Z01812 Encounter for preprocedural laboratory examination: Secondary | ICD-10-CM | POA: Diagnosis not present

## 2019-03-26 DIAGNOSIS — D0511 Intraductal carcinoma in situ of right breast: Secondary | ICD-10-CM | POA: Diagnosis not present

## 2019-03-31 DIAGNOSIS — D0511 Intraductal carcinoma in situ of right breast: Secondary | ICD-10-CM | POA: Diagnosis not present

## 2019-04-01 DIAGNOSIS — N63 Unspecified lump in unspecified breast: Secondary | ICD-10-CM | POA: Diagnosis not present

## 2019-04-01 DIAGNOSIS — C50011 Malignant neoplasm of nipple and areola, right female breast: Secondary | ICD-10-CM | POA: Diagnosis not present

## 2019-04-01 DIAGNOSIS — Z170 Estrogen receptor positive status [ER+]: Secondary | ICD-10-CM | POA: Diagnosis not present

## 2019-04-01 DIAGNOSIS — E785 Hyperlipidemia, unspecified: Secondary | ICD-10-CM | POA: Diagnosis present

## 2019-04-01 DIAGNOSIS — M0589 Other rheumatoid arthritis with rheumatoid factor of multiple sites: Secondary | ICD-10-CM | POA: Diagnosis present

## 2019-04-01 DIAGNOSIS — M3500 Sicca syndrome, unspecified: Secondary | ICD-10-CM | POA: Diagnosis present

## 2019-04-01 DIAGNOSIS — D0511 Intraductal carcinoma in situ of right breast: Secondary | ICD-10-CM | POA: Diagnosis not present

## 2019-04-06 DIAGNOSIS — Z9889 Other specified postprocedural states: Secondary | ICD-10-CM | POA: Insufficient documentation

## 2019-04-08 DIAGNOSIS — Z421 Encounter for breast reconstruction following mastectomy: Secondary | ICD-10-CM | POA: Diagnosis not present

## 2019-04-08 DIAGNOSIS — D0511 Intraductal carcinoma in situ of right breast: Secondary | ICD-10-CM | POA: Diagnosis not present

## 2019-04-08 DIAGNOSIS — K219 Gastro-esophageal reflux disease without esophagitis: Secondary | ICD-10-CM | POA: Diagnosis not present

## 2019-04-08 DIAGNOSIS — M3500 Sicca syndrome, unspecified: Secondary | ICD-10-CM | POA: Diagnosis not present

## 2019-04-08 DIAGNOSIS — Z483 Aftercare following surgery for neoplasm: Secondary | ICD-10-CM | POA: Diagnosis not present

## 2019-04-08 DIAGNOSIS — M0579 Rheumatoid arthritis with rheumatoid factor of multiple sites without organ or systems involvement: Secondary | ICD-10-CM | POA: Diagnosis not present

## 2019-04-08 DIAGNOSIS — Z9011 Acquired absence of right breast and nipple: Secondary | ICD-10-CM | POA: Diagnosis not present

## 2019-04-08 DIAGNOSIS — Z4803 Encounter for change or removal of drains: Secondary | ICD-10-CM | POA: Diagnosis not present

## 2019-04-08 DIAGNOSIS — E785 Hyperlipidemia, unspecified: Secondary | ICD-10-CM | POA: Diagnosis not present

## 2019-04-08 DIAGNOSIS — K589 Irritable bowel syndrome without diarrhea: Secondary | ICD-10-CM | POA: Diagnosis not present

## 2019-04-08 DIAGNOSIS — I10 Essential (primary) hypertension: Secondary | ICD-10-CM | POA: Diagnosis not present

## 2019-04-09 DIAGNOSIS — Z170 Estrogen receptor positive status [ER+]: Secondary | ICD-10-CM | POA: Diagnosis not present

## 2019-04-09 DIAGNOSIS — D0511 Intraductal carcinoma in situ of right breast: Secondary | ICD-10-CM | POA: Diagnosis not present

## 2019-04-30 DIAGNOSIS — H5203 Hypermetropia, bilateral: Secondary | ICD-10-CM | POA: Diagnosis not present

## 2019-04-30 DIAGNOSIS — D3131 Benign neoplasm of right choroid: Secondary | ICD-10-CM | POA: Diagnosis not present

## 2019-04-30 DIAGNOSIS — H40053 Ocular hypertension, bilateral: Secondary | ICD-10-CM | POA: Diagnosis not present

## 2019-04-30 DIAGNOSIS — H524 Presbyopia: Secondary | ICD-10-CM | POA: Diagnosis not present

## 2019-04-30 DIAGNOSIS — H52223 Regular astigmatism, bilateral: Secondary | ICD-10-CM | POA: Diagnosis not present

## 2019-05-05 ENCOUNTER — Ambulatory Visit: Payer: Medicare Other | Admitting: Physical Therapy

## 2019-05-06 DIAGNOSIS — M0589 Other rheumatoid arthritis with rheumatoid factor of multiple sites: Secondary | ICD-10-CM | POA: Diagnosis not present

## 2019-05-06 DIAGNOSIS — Z79899 Other long term (current) drug therapy: Secondary | ICD-10-CM | POA: Diagnosis not present

## 2019-06-09 DIAGNOSIS — R7989 Other specified abnormal findings of blood chemistry: Secondary | ICD-10-CM | POA: Diagnosis not present

## 2019-06-16 NOTE — Progress Notes (Signed)
[place]  [phone] [phone]) [phone]     ID: [name] DOB: [birth year]  MR#: [id number]  [id number]#:[id number]  Patient Care Team: Patricia Huddle, MD as PCP - General (Internal Medicine) Croitoru, Patricia Gobble, MD as Consulting Physician (Cardiology) Patricia Kaufmann, RN as Oncology Nurse Navigator Patricia Germany, RN as Oncology Nurse Navigator Patricia Long, Patricia Dad, MD as Consulting Physician (Oncology) Stark Klein, MD as Consulting Physician (General Surgery) Eppie Gibson, MD as Attending Physician (Radiation Oncology) Huel Cote, NP as Nurse Practitioner (Obstetrics and Gynecology) Patricia Busman, MD (Plastic Surgery) Patricia Mins, MD as Referring Physician (Surgical Oncology) Patricia Cruel, MD OTHER MD:  CHIEF COMPLAINT: Ductal carcinoma in situ, estrogen receptor positive  CURRENT TREATMENT: observation   HISTORY OF CURRENT ILLNESS: From the original intake note:  Patricia Long presented to her gynecologist with right nipple and retroareolar tenderness and redness intermittently for one year, with worsening redness and pain for 2 months. She underwent right diagnostic mammography and right breast ultrasonography at [facility] on 01/28/2019 showing: breast density category C; no evident etiology for nipple erythema or discomfort. She proceeded to bilateral breast MRI on 02/05/2019 to evaluate for possible Paget's disease. This showed: nonspecific non-mass enhancements within the central superior and lower inner right breast; asymmetric enhancement of the right nipple; no abnormal-appearing lymph nodes.  Accordingly on 02/21/2019 she proceeded to biopsy of the two right breast areas in question. The pathology from this procedure ([id number]) showed:  1. 11:30: mammary carcinoma in situ, high grade, necrosis is observed, e-cadherin positive. Prognostic indicators significant for: estrogen receptor, 100% positive and progesterone receptor, 40%  positive, both with strong staining intensity. 2. 4 o'clock: mammary carcinoma in situ, high grade, necrosis is observed, microinvasion cannot be excluded, e-cadherin positive. Of note, there is minimal tumor present. Prognostic indicators significant for: estrogen receptor, 100% positive and progesterone receptor, 10% positive, both with strong staining intensity.   Diagnostic left mammogram and right axilla ultrasound were performed on 02/27/2019 at [facility]. Both scans were benign.  The patient's subsequent history is as detailed below.   INTERVAL HISTORY: Patricia Long returns today for follow-up of her estrogen receptor positive ductal carcinoma in situ. She was last seen here on 03/05/2019.   Since her last visit here, she underwent a left mastectomy with sentinel lymph node mapping on 04/01/2019 under Dr. Ronnette Hila at [facility], with immediate reconstruction with a left-sided DIEP flap under Dr Wynetta Fines.  I cannot bring up the final pathology report in care everywhere but from Dr. Georgina Snell notes it seems clear there was no invasive disease and margins were negative  Of note also the patient's husband died from a ruptured aneurysm in the interval since her last visit here.  REVIEW OF SYSTEMS: Patricia Long did generally well with her surgery.  She is now less restricted and is able to walk a couple of miles a day.  The biggest problems she is having are awful hot flashes and inability to sleep.  She does fall asleep easily and then wakes up after about 2 hours.  Sometimes she is simply not able to get back to sleep for the rest of the night.  She had been started on venlafaxine here but after 2 doses she felt depressed and terrible and stopped that medication.  A detailed review of systems today was otherwise noncontributory   PAST MEDICAL HISTORY: Past Medical History:  Diagnosis Date   Basal cell carcinoma ~ 2013   "legs"   Chronic  bronchitis ([place])    "I've had it several times but not q yr"  (01/13/2015)   Endometriosis    Fibroid    Hypertension    Irritable bowel syndrome    Pneumonia 1970's X 1; ~ 06-Dec-2011   PONV (postoperative nausea and vomiting)    Rheumatoid arthritis(714.0)     PAST SURGICAL HISTORY: Past Surgical History:  Procedure Laterality Date   BASAL CELL CARCINOMA EXCISION  ~ 12/06/2011   "legs"   BREAST CYST EXCISION Bilateral 1992   NODULE EXCISION OF RIGHT AND LEFT BREAST   FOOT SURGERY Bilateral ~ 2006-2008   "reconstruction"   JOINT REPLACEMENT Left 2007-12-06   WRIST AND FINGER    KNEE ARTHROSCOPY Right 1980's   PELVIC LAPAROSCOPY  Dec 05, 1980 AND 12/05/85   TOTAL ABDOMINAL HYSTERECTOMY  1997   TAH/BSO  She has had 17 surgeries for her rheumatoid arthritis.   FAMILY HISTORY: Family History  Problem Relation Age of Onset   Heart disease Mother    Heart attack Mother    Heart disease Father    Heart attack Father    Colon cancer Maternal Aunt    Colon cancer Paternal Uncle    Breast cancer Maternal Aunt 52   Colon polyps Neg Hx    Diabetes Neg Hx    Kidney disease Neg Hx    Esophageal cancer Neg Hx    Patient's father was 66 years old when he died from a heart attack. Patient's mother died from heart attack at age 32. The patient notes a family hx of breast cancer. A maternal aunt was diagnosed with breast cancer at age 41. She has 3 siblings, 1 brother and 2 sisters. She denies a family history of ovarian cancer. She notes a history of colon cancer in a maternal aunt and a paternal uncle.  GYNECOLOGIC HISTORY:  No LMP recorded. Patient has had a hysterectomy. Menarche: 68 years old [place] P 0 LMP 1997 Contraceptive used from [id number] with no complications HRT yes, since 12-06-95, continues for symptom management   Hysterectomy? Yes, 1997, for fibroids BSO? 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  Her husband Patricia Long died in 2018/12/05 from a ruptured aneurysm.Marland Kitchen He used to sell diesels.   The patient lives by herself.  She e attends [employer].  ADVANCED DIRECTIVES: Her HCPOA is her niece, Patricia Needle.  Patricia Long phone number is [phone].   HEALTH MAINTENANCE: Social History   Tobacco Use   Smoking status: Never Smoker   Smokeless tobacco: Never Used  Substance Use Topics   Alcohol use: Yes    Alcohol/week: 1.0 standard drinks    Types: 1 Glasses of wine per week   Drug use: No     Colonoscopy: 08/24/2009, normal  PAP: December 05, 2013  Bone density: 12/16/2014 (at [facility])   Allergies  Allergen Reactions   Prochlorperazine Edisylate Other (See Comments)    compazine   Prochlorperazine Edisylate     Other reaction(s): Other (See Comments) compazine   Codeine Nausea Only and Rash   Prochlorperazine Rash   Tape Rash    Plastic tape, not paper tape    Current Outpatient Medications  Medication Sig Dispense Refill   amLODipine (NORVASC) 10 MG tablet TAKE 1 TABLET BY MOUTH  DAILY 90 tablet 3   betamethasone valerate ointment (VALISONE) 0.1 % Apply 1 application topically 2 (two) times daily. 30 g 0   cetirizine (ZYRTEC) 10 MG tablet Take 10 mg by  mouth daily as needed (remicaide infusion).     cloNIDine (CATAPRES-TTS-1) 0.1 mg/24hr patch Place 1 patch (0.1 mg total) onto the skin once a week. 4 patch 12   Coenzyme Q10 200 MG capsule Take 1 capsule (200 mg total) by mouth daily. 90 capsule 3   estradiol (ESTRACE) 0.5 MG tablet Take 1 tablet (0.5 mg total) by mouth daily. 90 tablet 4   folic acid (FOLVITE) 1 MG tablet Take 1 mg by mouth daily.     gabapentin (NEURONTIN) 300 MG capsule Take 1 capsule (300 mg total) by mouth at bedtime. 90 capsule 4   hydroxychloroquine (PLAQUENIL) 200 MG tablet Take 200 mg by mouth daily.     inFLIXimab (REMICADE) 100 MG injection Inject 100 mg into the vein every 8 (eight) weeks.     Methotrexate, Anti-Rheumatic, (METHOTREXATE, PF, Sabinal) Inject 20 mg into the skin once a week.     Multiple Vitamin (MULTIVITAMIN)  tablet Take 1 tablet by mouth daily.     rosuvastatin (CRESTOR) 10 MG tablet TAKE 1 TABLET BY MOUTH  DAILY 90 tablet 3   valsartan (DIOVAN) 80 MG tablet Take 1 tablet by mouth every morning.     venlafaxine XR (EFFEXOR-XR) 37.5 MG 24 hr capsule Take 1 capsule (37.5 mg total) by mouth daily with breakfast. May increase to 2 capsules daily 60 capsule 2   No current facility-administered medications for this visit.     OBJECTIVE: Middle-aged white woman in no acute distress  Vitals:   06/17/19 1100  BP: (!) 151/66  Pulse: 60  Resp: 18  Temp: 97.8 F (36.6 C)  SpO2: 100%   Wt Readings from Last 3 Encounters:  06/17/19 134 lb 11.2 oz (61.1 kg)  03/05/19 135 lb 14.4 oz (61.6 kg)  02/07/19 135 lb (61.2 kg)   Body mass index is 20.48 kg/m.    ECOG FS:1 - Symptomatic but completely ambulatory  Ocular: Sclerae unicteric, pupils round and equal Ear-nose-throat: Wearing a mask Lymphatic: No cervical or supraclavicular adenopathy Lungs no rales or rhonchi Heart regular rate and rhythm Abd soft, nontender, positive bowel sounds MSK no focal spinal tenderness, no joint edema Neuro: non-focal, well-oriented, appropriate affect Breasts: The right breast is status post mastectomy with D IEP reconstruction.  There is fair symmetry.  The flap appears viable.  The left breast is benign.  Both axillae are benign.  LAB RESULTS:  CMP     Component Value Date/Time   NA 141 03/05/2019 0837   NA 142 09/21/2017 1119   K 4.3 03/05/2019 0837   CL 104 03/05/2019 0837   CO2 29 03/05/2019 0837   GLUCOSE 81 03/05/2019 0837   BUN 19 03/05/2019 0837   BUN 20 09/21/2017 1119   CREATININE 0.88 03/05/2019 0837   CALCIUM 8.8 (L) 03/05/2019 0837   PROT 6.1 (L) 03/05/2019 0837   PROT 6.6 09/21/2017 1119   ALBUMIN 3.6 03/05/2019 0837   ALBUMIN 4.3 09/21/2017 1119   AST 25 03/05/2019 0837   ALT 27 03/05/2019 0837   ALKPHOS 52 03/05/2019 0837   BILITOT 0.4 03/05/2019 0837   GFRNONAA >60  03/05/2019 0837   GFRAA >60 03/05/2019 0837    No results found for: TOTALPROTELP, ALBUMINELP, A1GS, A2GS, BETS, BETA2SER, GAMS, MSPIKE, SPEI  No results found for: KPAFRELGTCHN, LAMBDASER, KAPLAMBRATIO  Lab Results  Component Value Date   WBC 6.1 03/05/2019   NEUTROABS 3.4 03/05/2019   HGB 13.0 03/05/2019   HCT 38.0 03/05/2019   MCV 101.1 (H) 03/05/2019  PLT 248 03/12/2019    @LASTCHEMISTRY @  No results found for: LABCA2  No components found for: [id number]  No results for input(s): INR in the last 168 hours.  No results found for: LABCA2  No results found for: TIR443  No results found for: XVQ008  No results found for: QPY195  No results found for: CA2729  No components found for: HGQUANT  No results found for: CEA1 / No results found for: CEA1   No results found for: AFPTUMOR  No results found for: CHROMOGRNA  No results found for: PSA1  No visits with results within 3 Day(s) from this visit.  Latest known visit with results is:  Clinical Support on 03/12/2019  Component Date Value Ref Range Status   Coagulation Factor VIII 03/12/2019 88  56 - 140 % Final   Ristocetin Co-factor, Plasma 03/12/2019 86  50 - 200 % Final   Comment: (NOTE) Performed At: [facility] [place], [place] [id number] Rush Farmer MD PY:[id number]    Von Willebrand Antigen, Plasma 03/12/2019 90  50 - 200 % Final   Comment: (NOTE) This test was developed and its performance characteristics determined by [facility]. It has not been cleared or approved by the Food and Drug Administration.    Prothrombin Time 03/12/2019 12.4  11.4 - 15.2 seconds Final   INR 03/12/2019 0.9  0.8 - 1.2 Final   Comment: (NOTE) INR goal varies based on device and disease states.    aPTT 03/12/2019 26  24 - 36 seconds Final   Fibrinogen 03/12/2019 233  210 - 475 mg/dL Final   D-Dimer, Quant 03/12/2019 0.31  0.00 - 0.50 ug/mL-FEU Final   Comment: (NOTE) At the  manufacturer cut-off of 0.50 ug/mL FEU, this assay has been documented to exclude PE with a sensitivity and negative predictive value of 97 to 99%.  At this time, this assay has not been approved by the FDA to exclude DVT/VTE. Results should be correlated with clinical presentation.    Platelets 03/12/2019 248  150 - 400 K/uL Final   Smear Review 03/12/2019 NO SCHISTOCYTES SEEN   Final   Performed at [facility], [place] [address] [place], [place] [address]   Factor XIII, Qualitative 03/12/2019 NORMAL  No Lysis/24 Final   Comment: (NOTE) Factor XIII activity was measured by a urea solubility assay. This method is meant as a screen and detects only severe deficiency (activity <2%) as may occur with homozygous hereditary factor XIII deficiency or acquired deficiency due to the presence of a specific factor XIII inhibitor. Performed At: [facility] [place], [place] [id number] Rush Farmer MD WI:[id number]    Interpretation 03/12/2019 Note   Final   Comment: (NOTE) ------------------------------- COAGULATION: VON WILLEBRAND FACTOR ASSESSMENT CURRENT RESULTS ASSESSMENT The VWF:Ag is normal. The VWF:RCo is normal. The FVIII is normal. VON WILLEBRAND FACTOR ASSESSMENT CURRENT RESULTS INTERPRETATION - These results are not consistent with a diagnosis of VWD according to the current NHLBI guideline. VON WILLEBRAND FACTOR ASSESSMENT - Results may be falsely elevated and possibly falsely normal as VWF and FVIII may increase in samples drawn from patients (particularly children) who are visibly stressed at the time of phlebotomy, as acute phase reactants, or in response to certain drug therapies such as desmopressin. Repeat testing may be necessary before excluding a diagnosis of VWD especially if the clinical suspicion is high for an underlying bleeding disorder. The setting for phlebotomy should be as calm as possible and patients should  be encouraged to sit quietly prior to the blood draw. VON WILLEBRAND FACTOR ASSESSMENT                           DEFINITIONS - VWD - von Willebrand disease; VWF - von Willebrand factor; VWF:Ag - VWF antigen; VWF:RCo - VWF ristocetin cofactor activity; FVIII - factor VIII activity. - MEDICAL DIRECTOR: For questions regarding panel interpretation, please contact Jake Bathe, M.D. at [facility] at [phone]. ------------------------------- DISCLAIMER These assessments and interpretations are provided as a convenience in support of the physician-patient relationship and are not intended to replace the physician's clinical judgment. They are derived from national guidelines in addition to other evidence and expert opinion. The clinician should consider this information within the context of clinical opinion and the individual patient. SEE GUIDANCE FOR VON WILLEBRAND FACTOR ASSESSMENT: (1) The National Heart, Lung and Blood Institute. The Diagnosis, Evaluation and Management of von Willebrand Disease. Janeal Holmes, MD: [place]                          n [phone]. 2007. Available at vSpecials.com.pt. (2) Daryl Eastern et al. Carmin [place] J Hematol. 2009; 84(6):366-370. (3) Wade. 2004;10(3):199-217. (4) Pasi KJ et al. Haemophilia. 2004; 10(3):218-231. Performed At: [facility] [place], [place] [id number] Thomasene Ripple MD [phone]     (this displays the last labs from the last 3 days)  No results found for: TOTALPROTELP, ALBUMINELP, A1GS, A2GS, BETS, BETA2SER, GAMS, MSPIKE, SPEI (this displays SPEP labs)  No results found for: KPAFRELGTCHN, LAMBDASER, KAPLAMBRATIO (kappa/lambda light chains)  No results found for: HGBA, HGBA2QUANT, HGBFQUANT, HGBSQUAN (Hemoglobinopathy evaluation)   No results found for: LDH  No results found for: IRON, TIBC,  IRONPCTSAT (Iron and TIBC)  No results found for: FERRITIN  Urinalysis    Component Value Date/Time   COLORURINE YELLOW 07/24/2014 [place] 07/24/2014 1647   LABSPEC 1.015 07/24/2014 1647   PHURINE 5.5 07/24/2014 [place] 07/24/2014 [place] 07/24/2014 [place] 07/24/2014 [place] 07/24/2014 1647   PROTEINUR NEG 11/14/2011 1220   UROBILINOGEN 0.2 07/24/2014 1647   NITRITE NEGATIVE 07/24/2014 [place] 07/24/2014 1647     STUDIES: No results found.  ELIGIBLE FOR AVAILABLE RESEARCH PROTOCOL: no  ASSESSMENT: 68 y.o. [place] woman status post right breast biopsy x2 on 02/21/2019 for multicentric ductal carcinoma in situ, grade 3, estrogen receptor positive  (1) status post R-sided skin sparing mastectomy 04/01/2019 with sentinel lymph node mapping, deep axillary sentinel lymph node biopsy as well as immediate reconstruction of the right breast with a left-sided MS2 fTRAM flap, secondary outflow vein anastomosis between second deep inferior epigastric vein and second internal mammary vein, and right chest local tissue rearrangement closure after resection of ischemic superoir mastectomy flap measuring 20 x 10 cm. at [facility], with negative margins  (a) final pathology Stage 0 (pTis (DCIS), pN0, cM0, ER+, PR+, HER2: Not Assessed)  (2) adjuvant antiestrogens not indicated  (3) genetics testing discussed  (4) possible coagulopathy work-up June 2020 showed a normal von Willebrand panel and normal factor XIII activity   PLAN: Patricia Long did well with her surgery and she understands the cure rate for noninvasive breast cancer with mastectomy approaches to 100%.  Accordingly she is done with that cancer.  Her risk of developing cancer in the contralateral breast is approximately 1/2 %/year.  The benefit of antiestrogens in this setting is marginal and she is not interested in  pursuing that.  We discussed sleep hygiene in detail today and I also wrote her for gabapentin to take at bedtime.  If she wishes to try the TTS 1 patch for daytime hot flashes also offered that to her.  She understands we have practically no data for vaginal estrogen use in women with a history of breast cancer.  I feel very comfortable prescribing that for patients on tamoxifen.  If she wishes to try that she will discuss further with her gynecologist.  I have to say her risk of developing breast cancer related to vaginal estrogens is likely to be minimal if any.  She will have future mammograms at [facility] and will be followed there.  Accordingly I am not making a return appointment for her here but will be glad to see her at any point in the future on an as-needed basis.  Patricia Long, Patricia Dad, MD  06/17/19 11:43 AM Medical Oncology and Hematology [facility] [place], [place] [address] Tel. [phone]    Fax. [phone]  I, Jacqualyn Posey am acting as a Education administrator for Patricia Cruel, MD.   I, Lurline Del MD, have reviewed the above documentation for accuracy and completeness, and I agree with the above.

## 2019-06-17 ENCOUNTER — Other Ambulatory Visit: Payer: Self-pay

## 2019-06-17 ENCOUNTER — Inpatient Hospital Stay: Payer: Medicare Other | Attending: Oncology | Admitting: Oncology

## 2019-06-17 ENCOUNTER — Encounter: Payer: Self-pay | Admitting: *Deleted

## 2019-06-17 VITALS — BP 151/66 | HR 60 | Temp 97.8°F | Resp 18 | Ht 68.0 in | Wt 134.7 lb

## 2019-06-17 DIAGNOSIS — K589 Irritable bowel syndrome without diarrhea: Secondary | ICD-10-CM | POA: Insufficient documentation

## 2019-06-17 DIAGNOSIS — Z800 Family history of malignant neoplasm of digestive organs: Secondary | ICD-10-CM | POA: Insufficient documentation

## 2019-06-17 DIAGNOSIS — Z90710 Acquired absence of both cervix and uterus: Secondary | ICD-10-CM | POA: Diagnosis not present

## 2019-06-17 DIAGNOSIS — Z803 Family history of malignant neoplasm of breast: Secondary | ICD-10-CM | POA: Diagnosis not present

## 2019-06-17 DIAGNOSIS — I10 Essential (primary) hypertension: Secondary | ICD-10-CM | POA: Insufficient documentation

## 2019-06-17 DIAGNOSIS — Z79810 Long term (current) use of selective estrogen receptor modulators (SERMs): Secondary | ICD-10-CM | POA: Insufficient documentation

## 2019-06-17 DIAGNOSIS — D0511 Intraductal carcinoma in situ of right breast: Secondary | ICD-10-CM | POA: Diagnosis not present

## 2019-06-17 DIAGNOSIS — M069 Rheumatoid arthritis, unspecified: Secondary | ICD-10-CM | POA: Insufficient documentation

## 2019-06-17 DIAGNOSIS — Z170 Estrogen receptor positive status [ER+]: Secondary | ICD-10-CM | POA: Insufficient documentation

## 2019-06-17 DIAGNOSIS — Z9012 Acquired absence of left breast and nipple: Secondary | ICD-10-CM | POA: Diagnosis not present

## 2019-06-17 DIAGNOSIS — Z79899 Other long term (current) drug therapy: Secondary | ICD-10-CM | POA: Diagnosis not present

## 2019-06-17 MED ORDER — CLONIDINE 0.1 MG/24HR TD PTWK: 0.1000 mg | MEDICATED_PATCH | TRANSDERMAL | 12 refills | Status: DC

## 2019-06-17 MED ORDER — GABAPENTIN 300 MG PO CAPS: 300.0000 mg | ORAL_CAPSULE | Freq: Every day | ORAL | 4 refills | Status: AC

## 2019-07-01 DIAGNOSIS — M0589 Other rheumatoid arthritis with rheumatoid factor of multiple sites: Secondary | ICD-10-CM | POA: Diagnosis not present

## 2019-07-08 DIAGNOSIS — M0589 Other rheumatoid arthritis with rheumatoid factor of multiple sites: Secondary | ICD-10-CM | POA: Diagnosis not present

## 2019-07-08 DIAGNOSIS — Z79899 Other long term (current) drug therapy: Secondary | ICD-10-CM | POA: Diagnosis not present

## 2019-07-08 DIAGNOSIS — Z6820 Body mass index (BMI) 20.0-20.9, adult: Secondary | ICD-10-CM | POA: Diagnosis not present

## 2019-07-08 DIAGNOSIS — M2550 Pain in unspecified joint: Secondary | ICD-10-CM | POA: Diagnosis not present

## 2019-07-23 DIAGNOSIS — I10 Essential (primary) hypertension: Secondary | ICD-10-CM | POA: Diagnosis not present

## 2019-07-23 DIAGNOSIS — C50911 Malignant neoplasm of unspecified site of right female breast: Secondary | ICD-10-CM | POA: Diagnosis not present

## 2019-07-23 DIAGNOSIS — M8580 Other specified disorders of bone density and structure, unspecified site: Secondary | ICD-10-CM | POA: Diagnosis not present

## 2019-07-23 DIAGNOSIS — M0540 Rheumatoid myopathy with rheumatoid arthritis of unspecified site: Secondary | ICD-10-CM | POA: Diagnosis not present

## 2019-07-23 DIAGNOSIS — E785 Hyperlipidemia, unspecified: Secondary | ICD-10-CM | POA: Diagnosis not present

## 2019-08-20 DIAGNOSIS — I10 Essential (primary) hypertension: Secondary | ICD-10-CM | POA: Diagnosis not present

## 2019-08-20 DIAGNOSIS — C50911 Malignant neoplasm of unspecified site of right female breast: Secondary | ICD-10-CM | POA: Diagnosis not present

## 2019-08-20 DIAGNOSIS — M0540 Rheumatoid myopathy with rheumatoid arthritis of unspecified site: Secondary | ICD-10-CM | POA: Diagnosis not present

## 2019-08-20 DIAGNOSIS — E785 Hyperlipidemia, unspecified: Secondary | ICD-10-CM | POA: Diagnosis not present

## 2019-08-20 DIAGNOSIS — M8580 Other specified disorders of bone density and structure, unspecified site: Secondary | ICD-10-CM | POA: Diagnosis not present

## 2019-08-26 DIAGNOSIS — M0589 Other rheumatoid arthritis with rheumatoid factor of multiple sites: Secondary | ICD-10-CM | POA: Diagnosis not present

## 2019-08-27 ENCOUNTER — Encounter: Payer: Self-pay | Admitting: Internal Medicine

## 2019-08-28 ENCOUNTER — Encounter: Payer: Self-pay | Admitting: Internal Medicine

## 2019-09-01 ENCOUNTER — Other Ambulatory Visit: Payer: Self-pay

## 2019-09-02 ENCOUNTER — Ambulatory Visit (INDEPENDENT_AMBULATORY_CARE_PROVIDER_SITE_OTHER): Payer: Medicare Other | Admitting: Women's Health

## 2019-09-02 ENCOUNTER — Encounter: Payer: Self-pay | Admitting: Women's Health

## 2019-09-02 VITALS — BP 126/80

## 2019-09-02 DIAGNOSIS — N949 Unspecified condition associated with female genital organs and menstrual cycle: Secondary | ICD-10-CM | POA: Diagnosis not present

## 2019-09-02 DIAGNOSIS — Z853 Personal history of malignant neoplasm of breast: Secondary | ICD-10-CM

## 2019-09-02 DIAGNOSIS — R350 Frequency of micturition: Secondary | ICD-10-CM

## 2019-09-02 DIAGNOSIS — R3915 Urgency of urination: Secondary | ICD-10-CM

## 2019-09-02 DIAGNOSIS — N9489 Other specified conditions associated with female genital organs and menstrual cycle: Secondary | ICD-10-CM

## 2019-09-02 LAB — WET PREP FOR TRICH, YEAST, CLUE

## 2019-09-02 NOTE — Progress Notes (Signed)
68 year old widowed WF G1, P0 presents with complaint of occasional vaginal cramping for the past few weeks.  Denies vaginal discharge, pain or burning with urination, back pain or fever.  Having some increased frequency, urgency.  Denies GI changes, indigestion, nausea or constipation.  1997 TAH with BSO for fibroids on no HRT.  03/2019 right breast cancer with mastectomy with reconstruction DCIS, no chemo or radiation.  Medical problems include rheumatoid arthritis on methotrexate, and hypertension.  Husband died this past summer.  Exam: Appears well.  No CVAT.  Abdomen nontender, external genitalia within normal limits, speculum exam atrophic, no discharge or erythema, wet prep negative.  Bimanual no tenderness with exam.  Vaginal cramping  Plan: Reviewed normality of exam, wet prep and TAH/BSO most likely not GYN related.  Keep scheduled screening colonoscopy in January, has follow-up with urologist in February.  UA pending.

## 2019-09-02 NOTE — Addendum Note (Signed)
Addended by: Lorine Bears on: 09/02/2019 04:44 PM   Modules accepted: Orders

## 2019-09-11 DIAGNOSIS — I10 Essential (primary) hypertension: Secondary | ICD-10-CM | POA: Diagnosis not present

## 2019-09-11 DIAGNOSIS — M8580 Other specified disorders of bone density and structure, unspecified site: Secondary | ICD-10-CM | POA: Diagnosis not present

## 2019-09-11 DIAGNOSIS — C50911 Malignant neoplasm of unspecified site of right female breast: Secondary | ICD-10-CM | POA: Diagnosis not present

## 2019-09-11 DIAGNOSIS — M0540 Rheumatoid myopathy with rheumatoid arthritis of unspecified site: Secondary | ICD-10-CM | POA: Diagnosis not present

## 2019-09-11 DIAGNOSIS — E785 Hyperlipidemia, unspecified: Secondary | ICD-10-CM | POA: Diagnosis not present

## 2019-09-16 ENCOUNTER — Encounter: Payer: Self-pay | Admitting: *Deleted

## 2019-10-02 ENCOUNTER — Other Ambulatory Visit: Payer: Self-pay

## 2019-10-02 ENCOUNTER — Ambulatory Visit (AMBULATORY_SURGERY_CENTER): Payer: Medicare Other | Admitting: *Deleted

## 2019-10-02 VITALS — Ht 68.0 in | Wt 135.0 lb

## 2019-10-02 DIAGNOSIS — Z800 Family history of malignant neoplasm of digestive organs: Secondary | ICD-10-CM

## 2019-10-02 DIAGNOSIS — Z1159 Encounter for screening for other viral diseases: Secondary | ICD-10-CM

## 2019-10-02 MED ORDER — SUPREP BOWEL PREP KIT 17.5-3.13-1.6 GM/177ML PO SOLN: 1.0000 | Freq: Once | ORAL | 0 refills | Status: AC

## 2019-10-02 NOTE — Progress Notes (Signed)
Pt's previsit is done over the phone and all paperwork (prep instructions, blank consent form to just read over, pre-procedure acknowledgement form and stamped envelope) sent to patient  Pt is aware that care partner will wait in the car during procedure; if they feel like they will be too hot or cold to wait in the car; they may wait in the 4 th floor lobby. Patient is aware to bring only one care partner. We want them to wear a mask (we do not have any that we can provide them), practice social distancing, and we will check their temperatures when they get here.  I did remind the patient that their care partner needs to stay in the parking lot the entire time and have a cell phone available, we will call them when the pt is ready for discharge. Patient will wear mask into building.  No egg or soy allergy  No home oxygen use   No medications for weight loss taken  emmi information given  covid test 10-06-19 at 1125 am

## 2019-10-06 ENCOUNTER — Ambulatory Visit (INDEPENDENT_AMBULATORY_CARE_PROVIDER_SITE_OTHER): Payer: Medicare Other

## 2019-10-06 DIAGNOSIS — Z1159 Encounter for screening for other viral diseases: Secondary | ICD-10-CM | POA: Diagnosis not present

## 2019-10-06 LAB — SARS CORONAVIRUS 2 (TAT 6-24 HRS): SARS Coronavirus 2: NEGATIVE

## 2019-10-09 ENCOUNTER — Encounter: Payer: Self-pay | Admitting: Internal Medicine

## 2019-10-09 ENCOUNTER — Ambulatory Visit (AMBULATORY_SURGERY_CENTER): Payer: Medicare Other | Admitting: Internal Medicine

## 2019-10-09 ENCOUNTER — Other Ambulatory Visit: Payer: Self-pay

## 2019-10-09 VITALS — BP 145/69 | HR 59 | Temp 97.5°F | Resp 13 | Ht 68.0 in | Wt 135.0 lb

## 2019-10-09 DIAGNOSIS — D122 Benign neoplasm of ascending colon: Secondary | ICD-10-CM | POA: Diagnosis not present

## 2019-10-09 DIAGNOSIS — Z1211 Encounter for screening for malignant neoplasm of colon: Secondary | ICD-10-CM | POA: Diagnosis not present

## 2019-10-09 DIAGNOSIS — I10 Essential (primary) hypertension: Secondary | ICD-10-CM | POA: Diagnosis not present

## 2019-10-09 DIAGNOSIS — Z800 Family history of malignant neoplasm of digestive organs: Secondary | ICD-10-CM

## 2019-10-09 DIAGNOSIS — M069 Rheumatoid arthritis, unspecified: Secondary | ICD-10-CM | POA: Diagnosis not present

## 2019-10-09 MED ORDER — SODIUM CHLORIDE 0.9 % IV SOLN: 500.0000 mL | Freq: Once | INTRAVENOUS | Status: DC

## 2019-10-09 NOTE — Op Note (Signed)
[facility] Patient Name: Patricia Long Procedure Date: 10/09/2019 10:04 AM MRN: [id number] Endoscopist: Docia Chuck. Henrene Pastor , MD Age: 69 Referring MD:  Date of Birth: 09-04-1951 Gender: Female Account #: [id number] Procedure:                Colonoscopy with cold snare polypectomy x 1. Indications:              Colon cancer screening in patient at increased                            risk: Family history of colorectal cancer in                            multiple 2nd degree relatives previous examinations                            2002 and 2010, negative for neoplasia Medicines:                Monitored Anesthesia Care Procedure:                Pre-Anesthesia Assessment:                           - Prior to the procedure, a History and Physical                            was performed, and patient medications and                            allergies were reviewed. The patient's tolerance of                            previous anesthesia was also reviewed. The risks                            and benefits of the procedure and the sedation                            options and risks were discussed with the patient.                            All questions were answered, and informed consent                            was obtained. Prior Anticoagulants: The patient has                            taken no previous anticoagulant or antiplatelet                            agents. ASA Grade Assessment: II - A patient with                            mild systemic disease. After reviewing the risks  and benefits, the patient was deemed in                            satisfactory condition to undergo the procedure.                           After obtaining informed consent, the colonoscope                            was passed under direct vision. Throughout the                            procedure, the patient's blood pressure, pulse, and        oxygen saturations were monitored continuously. The                            Colonoscope was introduced through the anus and                            advanced to the the cecum, identified by                            appendiceal orifice and ileocecal valve. The                            ileocecal valve, appendiceal orifice, and rectum                            were photographed. The quality of the bowel                            preparation was good. The colonoscopy was performed                            without difficulty. The patient tolerated the                            procedure well. The bowel preparation used was                            SUPREP via split dose instruction. Scope In: 10:07:05 AM Scope Out: 10:24:01 AM Scope Withdrawal Time: 0 hours 12 minutes 29 seconds  Total Procedure Duration: 0 hours 16 minutes 56 seconds  Findings:                 A 1 mm polyp was found in the ascending colon. The                            polyp was removed with a cold snare. Resection and                            retrieval were complete.  Multiple diverticula were found in the left colon.                           Internal hemorrhoids were found during retroflexion.                           The exam was otherwise without abnormality on                            direct and retroflexion views. Complications:            No immediate complications. Estimated blood loss:                            None. Estimated Blood Loss:     Estimated blood loss: none. Impression:               - One 1 mm polyp in the ascending colon, removed                            with a cold snare. Resected and retrieved.                           - Diverticulosis in the left colon.                           - Internal hemorrhoids.                           - The examination was otherwise normal on direct                            and retroflexion views. Recommendation:            - Repeat colonoscopy in 10 years for surveillance.                           - Patient has a contact number available for                            emergencies. The signs and symptoms of potential                            delayed complications were discussed with the                            patient. Return to normal activities tomorrow.                            Written discharge instructions were provided to the                            patient.                           - Resume previous diet.                           -  Continue present medications.                           - Await pathology results. Docia Chuck. Henrene Pastor, MD 10/09/2019 10:28:31 AM This report has been signed electronically.

## 2019-10-09 NOTE — Progress Notes (Signed)
Report to PACU, RN, vss, BBS= Clear.  

## 2019-10-09 NOTE — Patient Instructions (Signed)
Please see handouts given to you on Polyps, Diverticulosis, Hemorrhoids.   YOU HAD AN ENDOSCOPIC PROCEDURE TODAY AT [facility]:   Refer to the procedure report that was given to you for any specific questions about what was found during the examination.  If the procedure report does not answer your questions, please call your gastroenterologist to clarify.  If you requested that your care partner not be given the details of your procedure findings, then the procedure report has been included in a sealed envelope for you to review at your convenience later.  YOU SHOULD EXPECT: Some feelings of bloating in the abdomen. Passage of more gas than usual.  Walking can help get rid of the air that was put into your GI tract during the procedure and reduce the bloating. If you had a lower endoscopy (such as a colonoscopy or flexible sigmoidoscopy) you may notice spotting of blood in your stool or on the toilet paper. If you underwent a bowel prep for your procedure, you may not have a normal bowel movement for a few days.  Please Note:  You might notice some irritation and congestion in your nose or some drainage.  This is from the oxygen used during your procedure.  There is no need for concern and it should clear up in a day or so.  SYMPTOMS TO REPORT IMMEDIATELY:   Following lower endoscopy (colonoscopy or flexible sigmoidoscopy):  Excessive amounts of blood in the stool  Significant tenderness or worsening of abdominal pains  Swelling of the abdomen that is new, acute  Fever of 100F or higher    For urgent or emergent issues, a gastroenterologist can be reached at any hour by calling [phone].   DIET:  We do recommend a small meal at first, but then you may proceed to your regular diet.  Drink plenty of fluids but you should avoid alcoholic beverages for 24 hours.  ACTIVITY:  You should plan to take it easy for the rest of today and you should NOT DRIVE or use heavy  machinery until tomorrow (because of the sedation medicines used during the test).    FOLLOW UP: Our staff will call the number listed on your records 48-72 hours following your procedure to check on you and address any questions or concerns that you may have regarding the information given to you following your procedure. If we do not reach you, we will leave a message.  We will attempt to reach you two times.  During this call, we will ask if you have developed any symptoms of COVID 19. If you develop any symptoms (ie: fever, flu-like symptoms, shortness of breath, cough etc.) before then, please call [phone].  If you test positive for Covid 19 in the 2 weeks post procedure, please call and report this information to [place].    If any biopsies were taken you will be contacted by phone or by letter within the next 1-3 weeks.  Please call us at [phone] if you have not heard about the biopsies in 3 weeks.    SIGNATURES/CONFIDENTIALITY: You and/or your care partner have signed paperwork which will be entered into your electronic medical record.  These signatures attest to the fact that that the information above on your After Visit Summary has been reviewed and is understood.  Full responsibility of the confidentiality of this discharge information lies with you and/or your care-partner.

## 2019-10-13 ENCOUNTER — Encounter: Payer: Self-pay | Admitting: Internal Medicine

## 2019-10-13 ENCOUNTER — Telehealth: Payer: Self-pay

## 2019-10-13 NOTE — Telephone Encounter (Signed)
  Follow up Call-  Call back number 10/09/2019  Post procedure Call Back phone  # [phone]  Permission to leave phone message Yes  Some recent data might be hidden     Patient questions:  Do you have a fever, pain , or abdominal swelling? No. Pain Score  0 *  Have you tolerated food without any problems? Yes.    Have you been able to return to your normal activities? Yes.    Do you have any questions about your discharge instructions: Diet   No. Medications  No. Follow up visit  No.  Do you have questions or concerns about your Care? No.  Actions: * If pain score is 4 or above: No action needed, pain <4.  1. Have you developed a fever since your procedure? no  2.   Have you had an respiratory symptoms (SOB or cough) since your procedure? no  3.   Have you tested positive for COVID 19 since your procedure no  4.   Have you had any family members/close contacts diagnosed with the COVID 19 since your procedure?  no   If yes to any of these questions please route to Joylene John, RN and Alphonsa Gin, Therapist, sports.

## 2019-10-21 DIAGNOSIS — M0589 Other rheumatoid arthritis with rheumatoid factor of multiple sites: Secondary | ICD-10-CM | POA: Diagnosis not present

## 2019-10-21 DIAGNOSIS — Z79899 Other long term (current) drug therapy: Secondary | ICD-10-CM | POA: Diagnosis not present

## 2019-11-04 DIAGNOSIS — N3281 Overactive bladder: Secondary | ICD-10-CM | POA: Diagnosis not present

## 2019-11-04 DIAGNOSIS — N9489 Other specified conditions associated with female genital organs and menstrual cycle: Secondary | ICD-10-CM | POA: Diagnosis not present

## 2019-11-05 DIAGNOSIS — M0540 Rheumatoid myopathy with rheumatoid arthritis of unspecified site: Secondary | ICD-10-CM | POA: Diagnosis not present

## 2019-11-05 DIAGNOSIS — M8580 Other specified disorders of bone density and structure, unspecified site: Secondary | ICD-10-CM | POA: Diagnosis not present

## 2019-11-05 DIAGNOSIS — C50911 Malignant neoplasm of unspecified site of right female breast: Secondary | ICD-10-CM | POA: Diagnosis not present

## 2019-11-05 DIAGNOSIS — I10 Essential (primary) hypertension: Secondary | ICD-10-CM | POA: Diagnosis not present

## 2019-11-05 DIAGNOSIS — E785 Hyperlipidemia, unspecified: Secondary | ICD-10-CM | POA: Diagnosis not present

## 2019-11-11 DIAGNOSIS — N3946 Mixed incontinence: Secondary | ICD-10-CM | POA: Diagnosis not present

## 2019-11-11 DIAGNOSIS — R350 Frequency of micturition: Secondary | ICD-10-CM | POA: Diagnosis not present

## 2019-11-11 DIAGNOSIS — N9489 Other specified conditions associated with female genital organs and menstrual cycle: Secondary | ICD-10-CM | POA: Diagnosis not present

## 2019-11-11 DIAGNOSIS — R102 Pelvic and perineal pain: Secondary | ICD-10-CM | POA: Diagnosis not present

## 2019-11-19 DIAGNOSIS — R102 Pelvic and perineal pain: Secondary | ICD-10-CM | POA: Diagnosis not present

## 2019-11-19 DIAGNOSIS — N3946 Mixed incontinence: Secondary | ICD-10-CM | POA: Diagnosis not present

## 2019-11-19 DIAGNOSIS — N9489 Other specified conditions associated with female genital organs and menstrual cycle: Secondary | ICD-10-CM | POA: Diagnosis not present

## 2019-11-19 DIAGNOSIS — R350 Frequency of micturition: Secondary | ICD-10-CM | POA: Diagnosis not present

## 2019-11-25 DIAGNOSIS — R350 Frequency of micturition: Secondary | ICD-10-CM | POA: Diagnosis not present

## 2019-11-25 DIAGNOSIS — N3946 Mixed incontinence: Secondary | ICD-10-CM | POA: Diagnosis not present

## 2019-11-25 DIAGNOSIS — R102 Pelvic and perineal pain: Secondary | ICD-10-CM | POA: Diagnosis not present

## 2019-11-25 DIAGNOSIS — N9489 Other specified conditions associated with female genital organs and menstrual cycle: Secondary | ICD-10-CM | POA: Diagnosis not present

## 2019-12-02 DIAGNOSIS — R102 Pelvic and perineal pain: Secondary | ICD-10-CM | POA: Diagnosis not present

## 2019-12-02 DIAGNOSIS — R350 Frequency of micturition: Secondary | ICD-10-CM | POA: Diagnosis not present

## 2019-12-02 DIAGNOSIS — N9489 Other specified conditions associated with female genital organs and menstrual cycle: Secondary | ICD-10-CM | POA: Diagnosis not present

## 2019-12-02 DIAGNOSIS — N3946 Mixed incontinence: Secondary | ICD-10-CM | POA: Diagnosis not present

## 2019-12-09 DIAGNOSIS — N9489 Other specified conditions associated with female genital organs and menstrual cycle: Secondary | ICD-10-CM | POA: Diagnosis not present

## 2019-12-09 DIAGNOSIS — N3946 Mixed incontinence: Secondary | ICD-10-CM | POA: Diagnosis not present

## 2019-12-09 DIAGNOSIS — R350 Frequency of micturition: Secondary | ICD-10-CM | POA: Diagnosis not present

## 2019-12-09 DIAGNOSIS — R102 Pelvic and perineal pain: Secondary | ICD-10-CM | POA: Diagnosis not present

## 2019-12-16 DIAGNOSIS — M0589 Other rheumatoid arthritis with rheumatoid factor of multiple sites: Secondary | ICD-10-CM | POA: Diagnosis not present

## 2019-12-17 DIAGNOSIS — N9489 Other specified conditions associated with female genital organs and menstrual cycle: Secondary | ICD-10-CM | POA: Diagnosis not present

## 2019-12-17 DIAGNOSIS — R102 Pelvic and perineal pain: Secondary | ICD-10-CM | POA: Diagnosis not present

## 2019-12-17 DIAGNOSIS — N3946 Mixed incontinence: Secondary | ICD-10-CM | POA: Diagnosis not present

## 2019-12-17 DIAGNOSIS — R350 Frequency of micturition: Secondary | ICD-10-CM | POA: Diagnosis not present

## 2019-12-24 DIAGNOSIS — N9489 Other specified conditions associated with female genital organs and menstrual cycle: Secondary | ICD-10-CM | POA: Diagnosis not present

## 2019-12-24 DIAGNOSIS — R102 Pelvic and perineal pain: Secondary | ICD-10-CM | POA: Diagnosis not present

## 2019-12-24 DIAGNOSIS — N3946 Mixed incontinence: Secondary | ICD-10-CM | POA: Diagnosis not present

## 2019-12-24 DIAGNOSIS — R350 Frequency of micturition: Secondary | ICD-10-CM | POA: Diagnosis not present

## 2019-12-29 DIAGNOSIS — U071 COVID-19: Secondary | ICD-10-CM | POA: Diagnosis not present

## 2019-12-29 DIAGNOSIS — Z20828 Contact with and (suspected) exposure to other viral communicable diseases: Secondary | ICD-10-CM | POA: Diagnosis not present

## 2019-12-29 DIAGNOSIS — Z03818 Encounter for observation for suspected exposure to other biological agents ruled out: Secondary | ICD-10-CM | POA: Diagnosis not present

## 2020-01-01 ENCOUNTER — Emergency Department (HOSPITAL_COMMUNITY)
Admission: EM | Admit: 2020-01-01 | Discharge: 2020-01-01 | Disposition: A | Discharge status: Home / Self Care | Payer: Medicare Other | Attending: Emergency Medicine | Admitting: Emergency Medicine

## 2020-01-01 ENCOUNTER — Other Ambulatory Visit: Payer: Self-pay | Admitting: Adult Health

## 2020-01-01 ENCOUNTER — Emergency Department (HOSPITAL_COMMUNITY): Payer: Medicare Other

## 2020-01-01 ENCOUNTER — Other Ambulatory Visit: Payer: Self-pay

## 2020-01-01 DIAGNOSIS — R05 Cough: Secondary | ICD-10-CM | POA: Diagnosis not present

## 2020-01-01 DIAGNOSIS — Z79899 Other long term (current) drug therapy: Secondary | ICD-10-CM | POA: Diagnosis not present

## 2020-01-01 DIAGNOSIS — I10 Essential (primary) hypertension: Secondary | ICD-10-CM | POA: Insufficient documentation

## 2020-01-01 DIAGNOSIS — U071 COVID-19: Secondary | ICD-10-CM | POA: Diagnosis not present

## 2020-01-01 DIAGNOSIS — I2510 Atherosclerotic heart disease of native coronary artery without angina pectoris: Secondary | ICD-10-CM | POA: Insufficient documentation

## 2020-01-01 DIAGNOSIS — Z853 Personal history of malignant neoplasm of breast: Secondary | ICD-10-CM | POA: Diagnosis not present

## 2020-01-01 DIAGNOSIS — R0602 Shortness of breath: Secondary | ICD-10-CM | POA: Diagnosis not present

## 2020-01-01 DIAGNOSIS — Z85828 Personal history of other malignant neoplasm of skin: Secondary | ICD-10-CM | POA: Diagnosis not present

## 2020-01-01 LAB — COMPREHENSIVE METABOLIC PANEL
ALT: 61 U/L — ABNORMAL HIGH (ref 0–44)
AST: 68 U/L — ABNORMAL HIGH (ref 15–41)
Albumin: 3.5 g/dL (ref 3.5–5.0)
Alkaline Phosphatase: 61 U/L (ref 38–126)
Anion gap: 9 (ref 5–15)
BUN: 16 mg/dL (ref 8–23)
CO2: 26 mmol/L (ref 22–32)
Calcium: 8.8 mg/dL — ABNORMAL LOW (ref 8.9–10.3)
Chloride: 105 mmol/L (ref 98–111)
Creatinine, Ser: 0.71 mg/dL (ref 0.44–1.00)
GFR calc Af Amer: >60 mL/min (ref >60)
GFR calc non Af Amer: >60 mL/min (ref >60)
Glucose, Bld: 138 mg/dL — ABNORMAL HIGH (ref 70–99)
Potassium: 4.4 mmol/L (ref 3.5–5.1)
Sodium: 140 mmol/L (ref 135–145)
Total Bilirubin: 0.4 mg/dL (ref 0.3–1.2)
Total Protein: 6 g/dL — ABNORMAL LOW (ref 6.5–8.1)

## 2020-01-01 LAB — CBC WITH DIFFERENTIAL/PLATELET
Abs Immature Granulocytes: 0.01 10*3/uL (ref 0.00–0.07)
Basophils Absolute: 0 10*3/uL (ref 0.0–0.1)
Basophils Relative: 0 %
Eosinophils Absolute: 0 10*3/uL (ref 0.0–0.5)
Eosinophils Relative: 0 %
HCT: 43.4 % (ref 36.0–46.0)
Hemoglobin: 14.5 g/dL (ref 12.0–15.0)
Immature Granulocytes: 0 %
Lymphocytes Relative: 13 %
Lymphs Abs: 0.7 10*3/uL (ref 0.7–4.0)
MCH: 32.7 pg (ref 26.0–34.0)
MCHC: 33.4 g/dL (ref 30.0–36.0)
MCV: 98 fL (ref 80.0–100.0)
Monocytes Absolute: 0.8 10*3/uL (ref 0.1–1.0)
Monocytes Relative: 15 %
Neutro Abs: 3.8 10*3/uL (ref 1.7–7.7)
Neutrophils Relative %: 72 %
Platelets: 156 10*3/uL (ref 150–400)
RBC: 4.43 MIL/uL (ref 3.87–5.11)
RDW: 14.9 % (ref 11.5–15.5)
WBC: 5.3 10*3/uL (ref 4.0–10.5)
nRBC: 0 % (ref 0.0–0.2)

## 2020-01-01 LAB — POCT I-STAT EG7
Acid-Base Excess: 2 mmol/L (ref 0.0–2.0)
Bicarbonate: 26.6 mmol/L (ref 20.0–28.0)
Calcium, Ion: 1.18 mmol/L (ref 1.15–1.40)
HCT: 44 % (ref 36.0–46.0)
Hemoglobin: 15 g/dL (ref 12.0–15.0)
O2 Saturation: 93 %
Potassium: 4.1 mmol/L (ref 3.5–5.1)
Sodium: 141 mmol/L (ref 135–145)
TCO2: 28 mmol/L (ref 22–32)
pCO2, Ven: 40.7 mmHg — ABNORMAL LOW (ref 44.0–60.0)
pH, Ven: 7.423 (ref 7.250–7.430)
pO2, Ven: 64 mmHg — ABNORMAL HIGH (ref 32.0–45.0)

## 2020-01-01 MED ORDER — BENZONATATE 100 MG PO CAPS: 200.0000 mg | ORAL_CAPSULE | Freq: Three times a day (TID) | ORAL | 0 refills | Status: DC | PRN

## 2020-01-01 NOTE — Progress Notes (Signed)
  I connected by phone with Patricia Long on 01/01/2020 at 8:45 PM to discuss the potential use of an new treatment for mild to moderate COVID-19 viral infection in non-hospitalized patients.  This patient is a 69 y.o. female that meets the FDA criteria for Emergency Use Authorization of bamlanivimab/etesevimab or casirivimab/imdevimab.  Has a (+) direct SARS-CoV-2 viral test result  Has mild or moderate COVID-19   Is ? 69 years of age and weighs ? 40 kg  Is NOT hospitalized due to COVID-19  Is NOT requiring oxygen therapy or requiring an increase in baseline oxygen flow rate due to COVID-19  Is within 10 days of symptom onset  Has at least one of the high risk factor(s) for progression to severe COVID-19 and/or hospitalization as defined in EUA.  Specific high risk criteria : >/= 71 yo   I have spoken and communicated the following to the patient or parent/caregiver:  1. FDA has authorized the emergency use of bamlanivimab/etesevimab and casirivimab\imdevimab for the treatment of mild to moderate COVID-19 in adults and pediatric patients with positive results of direct SARS-CoV-2 viral testing who are 79 years of age and older weighing at least 40 kg, and who are at high risk for progressing to severe COVID-19 and/or hospitalization.  2. The significant known and potential risks and benefits of bamlanivimab/etesevimab and casirivimab\imdevimab, and the extent to which such potential risks and benefits are unknown.  3. Information on available alternative treatments and the risks and benefits of those alternatives, including clinical trials.  4. Patients treated with bamlanivimab/etesevimab and casirivimab\imdevimab should continue to self-isolate and use infection control measures (e.g., wear mask, isolate, social distance, avoid sharing personal items, clean and disinfect "high touch" surfaces, and frequent handwashing) according to CDC guidelines.   5. The patient or  parent/caregiver has the option to accept or refuse bamlanivimab/etesevimab or casirivimab\imdevimab .  After reviewing this information with the patient, The patient agreed to proceed with receiving the bamlanimivab infusion and will be provided a copy of the Fact sheet prior to receiving the infusion..  Scheduled for 01/02/20 at 1230.     01/01/2020 8:45 PM

## 2020-01-01 NOTE — ED Provider Notes (Signed)
[facility] EMERGENCY DEPARTMENT Provider Note   CSN: [id number] Arrival date & time: 01/01/20  1538     History No chief complaint on file.   Patricia Long is a 69 y.o. female.  HPI She presents for evaluation of cough with COVID-19 infection.  She began to be ill 5 days ago with achiness, and cough which have gradually worsened.  She had a test for Covid, positive antigen testing 4 days ago.  She has been able to drink fluids, but not eating much because of anorexia.  She has mild soreness in her chest and upper abdomen that she relates to coughing.  She thinks she got bit by an insect within the last day or 2 because she noticed some redness of her chin and neck.  Last night she tried a new cough medicine, hydrocodone elixir, and began vomiting after taking it so she presumes that she is allergic to it.  She has not had a Covid vaccine yet.  No other recent illnesses.  There are no other known modifying factors.    Past Medical History:  Diagnosis Date  . Arthritis    RA  . Basal cell carcinoma ~ 2013   "legs"  . Cancer [facility])    right breast cancer  . Cataract   . Chronic bronchitis ([place])    "I've had it several times but not q yr" (01/13/2015)  . Endometriosis   . Fibroid   . Hypertension   . Irritable bowel syndrome   . Pneumonia 1970's X 1; ~ 2013  . PONV (postoperative nausea and vomiting)   . Rheumatoid arthritis(714.0)     Patient Active Problem List   Diagnosis Date Noted  . History of breast reconstruction 04/06/2019  . Coagulopathy ([place]) 03/10/2019  . Ductal carcinoma in situ (DCIS) of right breast 02/26/2019  . Neck pain on right side 12/28/2015  . Postconcussion syndrome 10/25/2015  . Aortic arch atherosclerosis ([place]) 03/08/2015  . Hypertension 01/13/2015  . Chest pain 01/13/2015  . Labile hypertension 01/13/2015  . Rheumatoid arthritis ([place]) 08/29/2013  . High risk medication use 08/14/2013  . Osteopenia 02/01/2012  . ABDOMINAL  PAIN, EPIGASTRIC 08/02/2009  . GERD 07/30/2009  . CONSTIPATION 07/30/2009  . IRRITABLE BOWEL SYNDROME 07/30/2009  . NAUSEA AND VOMITING 07/30/2009  . Nausea without vomiting 07/30/2009  . ABDOMINAL PAIN, GENERALIZED 07/30/2009    Past Surgical History:  Procedure Laterality Date  . BASAL CELL CARCINOMA EXCISION  ~ 2013   "legs"  . BREAST CYST EXCISION Bilateral 1992   NODULE EXCISION OF RIGHT AND LEFT BREAST  . BREAST SURGERY     and reconstruction 2020  . COLONOSCOPY    . FOOT SURGERY Bilateral ~ 2006-2008   "reconstruction"  . JOINT REPLACEMENT Left 2009   WRIST AND FINGER   . KNEE ARTHROSCOPY Right 1980's  . PELVIC LAPAROSCOPY  '82 AND '87  . TOTAL ABDOMINAL HYSTERECTOMY  1997   TAH/BSO     OB History    Gravida  1   Para  0   Term      Preterm      AB  1   Living  0     SAB  1   TAB      Ectopic      Multiple      Live Births              Family History  Problem Relation Age of Onset  . Heart disease Mother   .  Heart attack Mother   . Heart disease Father   . Heart attack Father   . Colon cancer Maternal Aunt   . Colon cancer Paternal Uncle   . Breast cancer Maternal Aunt 79  . Colon polyps Neg Hx   . Diabetes Neg Hx   . Kidney disease Neg Hx   . Esophageal cancer Neg Hx   . Stomach cancer Neg Hx   . Rectal cancer Neg Hx     Social History   Tobacco Use  . Smoking status: Never Smoker  . Smokeless tobacco: Never Used  Substance Use Topics  . Alcohol use: Yes    Alcohol/week: 1.0 standard drinks    Types: 1 Glasses of wine per week  . Drug use: No    Home Medications Prior to Admission medications   Medication Sig Start Date End Date Taking? Authorizing Provider  amLODipine (NORVASC) 10 MG tablet TAKE 1 TABLET BY MOUTH  DAILY 02/24/19   Croitoru, Mihai, MD  benzonatate (TESSALON) 100 MG capsule Take 2 capsules (200 mg total) by mouth 3 (three) times daily as needed for cough. 01/01/20   , , MD  cetirizine (ZYRTEC)  10 MG tablet Take 10 mg by mouth daily as needed (remicaide infusion).    [provider]  Coenzyme Q10 200 MG capsule Take 1 capsule (200 mg total) by mouth daily. 02/11/19   Croitoru, Mihai, MD  folic acid (FOLVITE) 1 MG tablet Take 1 mg by mouth daily.    [provider]  gabapentin (NEURONTIN) 300 MG capsule Take 1 capsule (300 mg total) by mouth at bedtime. 06/17/19   Magrinat, Gustav C, MD  hydroxychloroquine (PLAQUENIL) 200 MG tablet Take 200 mg by mouth daily.    [provider]  inFLIXimab (REMICADE) 100 MG injection Inject 100 mg into the vein every 8 (eight) weeks.    [provider]  Methotrexate, Anti-Rheumatic, (METHOTREXATE, PF, Alexis) Inject 20 mg into the skin once a week.    [provider]  Multiple Vitamin (MULTIVITAMIN) tablet Take 1 tablet by mouth daily.    [provider]  traZODone (DESYREL) 50 MG tablet as directed. 08/20/19   [provider]  valsartan (DIOVAN) 80 MG tablet Take 80 mg by mouth daily.    [provider]    Allergies    Prochlorperazine edisylate, Prochlorperazine edisylate, Codeine, Prochlorperazine, and Tape  Review of Systems   Review of Systems  All other systems reviewed and are negative.   Physical Exam Updated Vital Signs BP 139/66   Pulse 65   Temp 99.4 F (37.4 C) (Oral)   Resp (!) 21   SpO2 93%   Physical Exam Vitals and nursing note reviewed.  Constitutional:      General: She is not in acute distress.    Appearance: She is well-developed. She is ill-appearing. She is not toxic-appearing or diaphoretic.  HENT:     Head: Normocephalic and atraumatic.     Right Ear: External ear normal.     Left Ear: External ear normal.     Nose: Nose normal.  Eyes:     Conjunctiva/sclera: Conjunctivae normal.     Pupils: Pupils are equal, round, and reactive to light.  Neck:     Trachea: Phonation normal.  Cardiovascular:     Rate and Rhythm: Normal rate.     Comments:  Normal pulse left radius. Pulmonary:     Effort: Pulmonary effort is normal. No respiratory distress.     Breath sounds:   No stridor.     Comments: Cough with deep breathing, nonproductive Abdominal:     General: There is no distension.     Palpations: Abdomen is soft. There is no mass.     Tenderness: There is no abdominal tenderness.  Musculoskeletal:        General: No swelling or tenderness. Normal range of motion.     Cervical back: Normal range of motion and neck supple.     Right lower leg: No edema.     Left lower leg: No edema.  Skin:    General: Skin is warm and dry.  Neurological:     Mental Status: She is alert and oriented to person, place, and time.     Cranial Nerves: No cranial nerve deficit.     Sensory: No sensory deficit.     Motor: No abnormal muscle tone.     Coordination: Coordination normal.  Psychiatric:        Mood and Affect: Mood normal.        Behavior: Behavior normal.        Thought Content: Thought content normal.        Judgment: Judgment normal.     ED Results / Procedures / Treatments   Labs (all labs ordered are listed, but only abnormal results are displayed) Labs Reviewed  COMPREHENSIVE METABOLIC PANEL - Abnormal; Notable for the following components:      Result Value   Glucose, Bld 138 (*)    Calcium 8.8 (*)    Total Protein 6.0 (*)    AST 68 (*)    ALT 61 (*)    All other components within normal limits  POCT I-STAT EG7 - Abnormal; Notable for the following components:   pCO2, Ven 40.7 (*)    pO2, Ven 64.0 (*)    All other components within normal limits  CBC WITH DIFFERENTIAL/PLATELET  BLOOD GAS, VENOUS    EKG None  Radiology DG Chest Port 1 View  Result Date: 01/01/2020 CLINICAL DATA:  Shortness of breath. Technologist notes state COVID. Cough. EXAM: PORTABLE CHEST 1 VIEW COMPARISON:  01/23/2018 FINDINGS: Minimal vague opacity at the right lung base. Lungs are otherwise clear. Unchanged heart size and mediastinal  contours. No pulmonary edema, pleural effusion, or pneumothorax. No acute osseous abnormalities are seen. IMPRESSION: Minimal vague opacity at the right lung base, likely atelectasis, however COVID pneumonia could have a similar appearance. Electronically Signed   By: Melanie  Sanford M.D.   On: 01/01/2020 16:50    Procedures Procedures (including critical care time)  Medications Ordered in ED Medications - No data to display  ED Course  I have reviewed the triage vital signs and the nursing notes.  Pertinent labs & imaging results that were available during my care of the patient were reviewed by me and considered in my medical decision making (see chart for details).  Clinical Course as of Dec 31 1921  Thu Jan 01, 2020  1731 Normal except PCO2 low, PO2 high  POCT I-Stat EG7(!) [EW]  1731 Normal  CBC with Differential [EW]  1731 Normal except glucose high, calcium low, total protein low, AST high, ALT high  Comprehensive metabolic panel(!) [EW]  1732 At this time patient was reevaluated.  Pulse oxygenation 93% on room air.  She remains fairly comfortable.  She agrees to treatment with monoclonal antibody therapy.   [EW]  1733 Case discussed with pharmacy, patient cannot be treated with monoclonal antibody therapy here at this time.  She does   not meet criteria for admission.  Plan outpatient monoclonal therapy tomorrow.  Request for treatment called to the infusion center, by me.   [EW]    Clinical Course User Index [EW] Daleen Bo, MD   MDM Rules/Calculators/A&P                       Patient Vitals for the past 24 hrs:  BP Temp Temp src Pulse Resp SpO2  01/01/20 1915 139/66 -- -- 65 (!) 21 93 %  01/01/20 1850 -- -- -- -- -- 95 %  01/01/20 1845 (!) 147/66 -- -- 70 19 95 %  01/01/20 1800 (!) 130/55 -- -- 65 18 94 %  01/01/20 1700 (!) 118/57 -- -- (!) 58 18 94 %  01/01/20 1619 -- -- -- 64 (!) 21 95 %  01/01/20 1615 125/71 -- -- 65 (!) 23 92 %  01/01/20 1602 125/63 99.4 F  (37.4 C) Oral 67 20 93 %    7:23 PM Reevaluation with update and discussion. After initial assessment and treatment, an updated evaluation reveals she is comfortable has no further complaints.  Findings discussed and questions answered. Daleen Bo   Medical Decision Making:  This patient is presenting for evaluation of symptoms associated with COVID-19 infection, primarily cough, which does require a range of treatment options, and is a complaint that involves a moderate risk of morbidity and mortality. The differential diagnoses include pneumonia, influenza. I decided  to review old records, and in summary she is an elderly female, with moderate chronic medical illnesses including rheumatoid arthritis, on chronic immunosuppressants, hypertension, history of breast cancer, coagulopathy, atherosclerosis of the aortic arch.  Recently she has been treated with pelvic rehabilitation for pelvic floor dysfunction. Clinical Laboratory Tests Ordered, included CBC, c-Met and venous blood gas.  Results reviewed radiologic Tests Ordered, included chest x-ray. I independently Visualized: Radiographic images, which show right lower lobe atelectasis; Cardiac Monitor Tracing which shows normal sinus rhythm.  Critical Interventions-assessment of respiratory status  After These Interventions, the Patient was reevaluated and was found comfortable and stable for discharge.  CRITICAL CARE-no Performed by: Daleen Bo   Nursing Notes Reviewed/ Care Coordinated Applicable Imaging Reviewed Interpretation of Laboratory Data incorporated into ED treatment  The patient appears reasonably screened and/or stabilized for discharge and I doubt any other medical condition or other [facility] requiring further screening, evaluation, or treatment in the ED at this time prior to discharge.  Plan: Home Medications-continue routine medicines, Tylenol for fever or pain, Robitussin-DM for cough; Home Treatments-rest, fluids;  return here if the recommended treatment, does not improve the symptoms; Recommended follow up-Covid-19 infusion center for monoclonal antibody treatment tomorrow.  PCP follow-up 1    Final Clinical Impression(s) / ED Diagnoses Final diagnoses:  COVID-19 virus infection    Rx / DC Orders ED Discharge Orders         Ordered    benzonatate (TESSALON) 100 MG capsule  3 times daily PRN     01/01/20 1917           Daleen Bo, MD 01/01/20 1924

## 2020-01-01 NOTE — Discharge Instructions (Addendum)
We are referring you to the [facility] infusion center for treatment with monoclonal antibody infusion.  Their phone number is [phone].  They should call you in the morning to schedule the appointment for tomorrow.  If you do not hear from them by 930 please call them.  In the meantime make sure you are getting plenty of rest, drinking a lot of fluids and try to eat 3 meals each day.  Try using Robitussin-DM for cough.  We also sent a prescription for cough medicine, to your pharmacy.  Return here if your condition worsens or you have other concerns.

## 2020-01-02 ENCOUNTER — Ambulatory Visit (HOSPITAL_COMMUNITY)
Admission: RE | Admit: 2020-01-02 | Discharge: 2020-01-02 | Disposition: A | Discharge status: Home / Self Care | Payer: Medicare Other | Source: Ambulatory Visit | Attending: Pulmonary Disease | Admitting: Pulmonary Disease

## 2020-01-02 DIAGNOSIS — U071 COVID-19: Secondary | ICD-10-CM | POA: Diagnosis not present

## 2020-01-02 DIAGNOSIS — Z23 Encounter for immunization: Secondary | ICD-10-CM | POA: Diagnosis not present

## 2020-01-02 MED ORDER — SODIUM CHLORIDE 0.9 % IV SOLN
Freq: Once | INTRAVENOUS | Status: AC
  Filled 2020-01-02: qty 700

## 2020-01-02 MED ORDER — FAMOTIDINE IN NACL 20-0.9 MG/50ML-% IV SOLN: 20.0000 mg | Freq: Once | INTRAVENOUS | Status: DC | PRN

## 2020-01-02 MED ORDER — EPINEPHRINE 0.3 MG/0.3ML IJ SOAJ: 0.3000 mg | Freq: Once | INTRAMUSCULAR | Status: DC | PRN

## 2020-01-02 MED ORDER — SODIUM CHLORIDE 0.9 % IV SOLN: INTRAVENOUS | Status: DC | PRN

## 2020-01-02 MED ORDER — ALBUTEROL SULFATE HFA 108 (90 BASE) MCG/ACT IN AERS: 2.0000 | INHALATION_SPRAY | Freq: Once | RESPIRATORY_TRACT | Status: DC | PRN

## 2020-01-02 MED ORDER — METHYLPREDNISOLONE SODIUM SUCC 125 MG IJ SOLR: 125.0000 mg | Freq: Once | INTRAMUSCULAR | Status: DC | PRN

## 2020-01-02 MED ORDER — DIPHENHYDRAMINE HCL 50 MG/ML IJ SOLN: 50.0000 mg | Freq: Once | INTRAMUSCULAR | Status: DC | PRN

## 2020-01-02 NOTE — Progress Notes (Signed)
  Diagnosis: COVID-19  Physician: Dr. Joya Gaskins  Procedure: [facility] Med: bamlanivimab\etesevimab infusion - Provided patient with bamlanimivab\etesevimab fact sheet for patients, parents and caregivers prior to infusion.  Complications: No immediate complications noted.  Discharge: Discharged home   Janine Ores 01/02/2020

## 2020-01-02 NOTE — Discharge Instructions (Signed)

## 2020-01-06 DIAGNOSIS — M0589 Other rheumatoid arthritis with rheumatoid factor of multiple sites: Secondary | ICD-10-CM | POA: Diagnosis not present

## 2020-01-06 DIAGNOSIS — M2550 Pain in unspecified joint: Secondary | ICD-10-CM | POA: Diagnosis not present

## 2020-01-06 DIAGNOSIS — Z79899 Other long term (current) drug therapy: Secondary | ICD-10-CM | POA: Diagnosis not present

## 2020-01-13 DIAGNOSIS — R350 Frequency of micturition: Secondary | ICD-10-CM | POA: Diagnosis not present

## 2020-01-13 DIAGNOSIS — R102 Pelvic and perineal pain: Secondary | ICD-10-CM | POA: Diagnosis not present

## 2020-01-13 DIAGNOSIS — Z8616 Personal history of COVID-19: Secondary | ICD-10-CM | POA: Diagnosis not present

## 2020-01-13 DIAGNOSIS — R5383 Other fatigue: Secondary | ICD-10-CM | POA: Diagnosis not present

## 2020-01-13 DIAGNOSIS — N9489 Other specified conditions associated with female genital organs and menstrual cycle: Secondary | ICD-10-CM | POA: Diagnosis not present

## 2020-01-13 DIAGNOSIS — N3946 Mixed incontinence: Secondary | ICD-10-CM | POA: Diagnosis not present

## 2020-01-19 ENCOUNTER — Other Ambulatory Visit: Payer: Self-pay

## 2020-01-20 ENCOUNTER — Encounter: Payer: Self-pay | Admitting: Women's Health

## 2020-01-20 ENCOUNTER — Ambulatory Visit (INDEPENDENT_AMBULATORY_CARE_PROVIDER_SITE_OTHER): Payer: Medicare Other | Admitting: Women's Health

## 2020-01-20 VITALS — BP 128/80 | Ht 68.0 in | Wt 132.0 lb

## 2020-01-20 DIAGNOSIS — Z9189 Other specified personal risk factors, not elsewhere classified: Secondary | ICD-10-CM | POA: Diagnosis not present

## 2020-01-20 DIAGNOSIS — C50911 Malignant neoplasm of unspecified site of right female breast: Secondary | ICD-10-CM | POA: Diagnosis not present

## 2020-01-20 DIAGNOSIS — Z1389 Encounter for screening for other disorder: Secondary | ICD-10-CM | POA: Diagnosis not present

## 2020-01-20 DIAGNOSIS — Z01419 Encounter for gynecological examination (general) (routine) without abnormal findings: Secondary | ICD-10-CM | POA: Diagnosis not present

## 2020-01-20 DIAGNOSIS — I700 Atherosclerosis of aorta: Secondary | ICD-10-CM | POA: Diagnosis not present

## 2020-01-20 DIAGNOSIS — M8580 Other specified disorders of bone density and structure, unspecified site: Secondary | ICD-10-CM | POA: Diagnosis not present

## 2020-01-20 DIAGNOSIS — Z0000 Encounter for general adult medical examination without abnormal findings: Secondary | ICD-10-CM | POA: Diagnosis not present

## 2020-01-20 DIAGNOSIS — G629 Polyneuropathy, unspecified: Secondary | ICD-10-CM | POA: Diagnosis not present

## 2020-01-20 DIAGNOSIS — Z79899 Other long term (current) drug therapy: Secondary | ICD-10-CM | POA: Diagnosis not present

## 2020-01-20 DIAGNOSIS — E2839 Other primary ovarian failure: Secondary | ICD-10-CM | POA: Diagnosis not present

## 2020-01-20 DIAGNOSIS — I10 Essential (primary) hypertension: Secondary | ICD-10-CM | POA: Diagnosis not present

## 2020-01-20 DIAGNOSIS — M0540 Rheumatoid myopathy with rheumatoid arthritis of unspecified site: Secondary | ICD-10-CM | POA: Diagnosis not present

## 2020-01-20 DIAGNOSIS — E785 Hyperlipidemia, unspecified: Secondary | ICD-10-CM | POA: Diagnosis not present

## 2020-01-20 NOTE — Patient Instructions (Signed)
Pleasure to know you! Vit d 2000 iu daily Health Maintenance After Age 71 After age 64, you are at a higher risk for certain long-term diseases and infections as well as injuries from falls. Falls are a major cause of broken bones and head injuries in people who are older than age 51. Getting regular preventive care can help to keep you healthy and well. Preventive care includes getting regular testing and making lifestyle changes as recommended by your health care provider. Talk with your health care provider about:  Which screenings and tests you should have. A screening is a test that checks for a disease when you have no symptoms.  A diet and exercise plan that is right for you. What should I know about screenings and tests to prevent falls? Screening and testing are the best ways to find a health problem early. Early diagnosis and treatment give you the best chance of managing medical conditions that are common after age 44. Certain conditions and lifestyle choices may make you more likely to have a fall. Your health care provider may recommend:  Regular vision checks. Poor vision and conditions such as cataracts can make you more likely to have a fall. If you wear glasses, make sure to get your prescription updated if your vision changes.  Medicine review. Work with your health care provider to regularly review all of the medicines you are taking, including over-the-counter medicines. Ask your health care provider about any side effects that may make you more likely to have a fall. Tell your health care provider if any medicines that you take make you feel dizzy or sleepy.  Osteoporosis screening. Osteoporosis is a condition that causes the bones to get weaker. This can make the bones weak and cause them to break more easily.  Blood pressure screening. Blood pressure changes and medicines to control blood pressure can make you feel dizzy.  Strength and balance checks. Your health care  provider may recommend certain tests to check your strength and balance while standing, walking, or changing positions.  Foot health exam. Foot pain and numbness, as well as not wearing proper footwear, can make you more likely to have a fall.  Depression screening. You may be more likely to have a fall if you have a fear of falling, feel emotionally low, or feel unable to do activities that you used to do.  Alcohol use screening. Using too much alcohol can affect your balance and may make you more likely to have a fall. What actions can I take to lower my risk of falls? General instructions  Talk with your health care provider about your risks for falling. Tell your health care provider if: ? You fall. Be sure to tell your health care provider about all falls, even ones that seem minor. ? You feel dizzy, sleepy, or off-balance.  Take over-the-counter and prescription medicines only as told by your health care provider. These include any supplements.  Eat a healthy diet and maintain a healthy weight. A healthy diet includes low-fat dairy products, low-fat (lean) meats, and fiber from whole grains, beans, and lots of fruits and vegetables. Home safety  Remove any tripping hazards, such as rugs, cords, and clutter.  Install safety equipment such as grab bars in bathrooms and safety rails on stairs.  Keep rooms and walkways well-lit. Activity   Follow a regular exercise program to stay fit. This will help you maintain your balance. Ask your health care provider what types of exercise are appropriate  for you.  If you need a cane or walker, use it as recommended by your health care provider.  Wear supportive shoes that have nonskid soles. Lifestyle  Do not drink alcohol if your health care provider tells you not to drink.  If you drink alcohol, limit how much you have: ? 0-1 drink a day for women. ? 0-2 drinks a day for men.  Be aware of how much alcohol is in your drink. In the  [place] one drink equals one typical bottle of beer (12 oz), one-half glass of wine (5 oz), or one shot of hard liquor (1 oz).  Do not use any products that contain nicotine or tobacco, such as cigarettes and e-cigarettes. If you need help quitting, ask your health care provider. Summary  Having a healthy lifestyle and getting preventive care can help to protect your health and wellness after age 107.  Screening and testing are the best way to find a health problem early and help you avoid having a fall. Early diagnosis and treatment give you the best chance for managing medical conditions that are more common for people who are older than age 30.  Falls are a major cause of broken bones and head injuries in people who are older than age 3. Take precautions to prevent a fall at home.  Work with your health care provider to learn what changes you can make to improve your health and wellness and to prevent falls. This information is not intended to replace advice given to you by your health care provider. Make sure you discuss any questions you have with your health care provider. Document Revised: 01/02/2019 Document Reviewed: 07/25/2017 Elsevier Patient Education  2020 [employer].

## 2020-01-20 NOTE — Progress Notes (Signed)
   Patricia Long [name] [birth year] [id number]   History:  69 y.o. for breast and pelvic exam.  1997 TAH with BSO for fibroids on estradiol which was stopped last year due to history of right breast cancer diagnosed June 2020 after noticing right breast nipple irritation/tenderness.  Mastectomy with reconstruction.  Normal Pap history.  Medical problems include RA, hypertension, IBS and GERD.  09/2019 - colonoscopy.  Primary care manages hypertension, RA on methotrexate.  Has had Pneumovax unable to take Shingrix to the methotrexate.  Husband died this past year who is in poor health.  12/2019 Covid is now recovering but was quite ill from it.  Past medical history, past surgical history, family history and social history were all reviewed and documented in the EPIC chart.  Owns property at [facility] and hoping to build.  ROS:  A ROS was performed and pertinent positives and negatives are included.  Exam:  Vitals:   01/20/20 1154  BP: 128/80  Weight: 132 lb (59.9 kg)  Height: 5\' 8"  (1.727 m)   Body mass index is 20.07 kg/m.  General appearance:  Normal Thyroid:  Symmetrical, normal in size, without palpable masses or nodularity. Respiratory  Auscultation:  Clear without wheezing or rhonchi Cardiovascular  Auscultation:  Regular rate, without rubs, murmurs or gallops  Edema/varicosities:  Not grossly evident Abdominal  Soft,nontender, without masses, guarding or rebound.  Liver/spleen:  No organomegaly noted  Hernia:  None appreciated  Skin  Inspection:  Grossly normal   Breasts: Examined lying and sitting.   Right: Mastectomy with reconstruction with abdominal fat tissue    Left: Without masses, retractions, discharge or axillary adenopathy. Gentitourinary   Inguinal/mons:  Normal without inguinal adenopathy  External genitalia:  Normal  BUS/Urethra/Skene's glands:  Normal  Vagina:  Normal  Cervix:  Normal  Uterus:  normal in size, shape and contour.  Midline and  mobile  Adnexa/parametria:     Rt: Without masses or tenderness.   Lt: Without masses or tenderness.  Anus and perineum: Normal  Digital rectal exam: Normal sphincter tone without palpated masses or tenderness  Assessment/Plan:  69 y.o. WWF G1P0 for breast and pelvic exam with no complaints.  02/2019 right breast cancer with mastectomy and reconstruction 12/2019 Covid RA on methotrexate Hypertension-primary care manages labs and meds 1997 TAH with BSO for fibroids  Plan: SBEs, continue annual screening mammograms, exercise, encouraged yoga, home safety and fall prevention discussed.  Repeat DEXA which she reports as normal at primary care/[facility].  Vitamin D 2000 IUs daily, calcium rich foods encouraged.  Condolences given on having such a terrible year, husband's death, breast cancer diagnosis/treatment and Covid.  Encouraged self-care, leisure activities.  Pap screening guidelines reviewed.    Huel Cote [facility], 12:31 PM 01/20/2020

## 2020-01-21 DIAGNOSIS — Z1283 Encounter for screening for malignant neoplasm of skin: Secondary | ICD-10-CM | POA: Diagnosis not present

## 2020-01-21 DIAGNOSIS — L821 Other seborrheic keratosis: Secondary | ICD-10-CM | POA: Diagnosis not present

## 2020-01-21 DIAGNOSIS — M85851 Other specified disorders of bone density and structure, right thigh: Secondary | ICD-10-CM | POA: Diagnosis not present

## 2020-01-21 DIAGNOSIS — Z780 Asymptomatic menopausal state: Secondary | ICD-10-CM | POA: Diagnosis not present

## 2020-01-21 DIAGNOSIS — M85852 Other specified disorders of bone density and structure, left thigh: Secondary | ICD-10-CM | POA: Diagnosis not present

## 2020-01-21 DIAGNOSIS — L820 Inflamed seborrheic keratosis: Secondary | ICD-10-CM | POA: Diagnosis not present

## 2020-01-21 DIAGNOSIS — B0089 Other herpesviral infection: Secondary | ICD-10-CM | POA: Diagnosis not present

## 2020-01-27 DIAGNOSIS — N3946 Mixed incontinence: Secondary | ICD-10-CM | POA: Diagnosis not present

## 2020-01-27 DIAGNOSIS — N9489 Other specified conditions associated with female genital organs and menstrual cycle: Secondary | ICD-10-CM | POA: Diagnosis not present

## 2020-01-27 DIAGNOSIS — R102 Pelvic and perineal pain: Secondary | ICD-10-CM | POA: Diagnosis not present

## 2020-01-27 DIAGNOSIS — Z8616 Personal history of COVID-19: Secondary | ICD-10-CM | POA: Diagnosis not present

## 2020-01-27 DIAGNOSIS — R350 Frequency of micturition: Secondary | ICD-10-CM | POA: Diagnosis not present

## 2020-01-27 DIAGNOSIS — R05 Cough: Secondary | ICD-10-CM | POA: Diagnosis not present

## 2020-02-03 DIAGNOSIS — R05 Cough: Secondary | ICD-10-CM | POA: Diagnosis not present

## 2020-02-03 DIAGNOSIS — R102 Pelvic and perineal pain: Secondary | ICD-10-CM | POA: Diagnosis not present

## 2020-02-03 DIAGNOSIS — R350 Frequency of micturition: Secondary | ICD-10-CM | POA: Diagnosis not present

## 2020-02-03 DIAGNOSIS — N9489 Other specified conditions associated with female genital organs and menstrual cycle: Secondary | ICD-10-CM | POA: Diagnosis not present

## 2020-02-03 DIAGNOSIS — Z8616 Personal history of COVID-19: Secondary | ICD-10-CM | POA: Diagnosis not present

## 2020-02-03 DIAGNOSIS — N3946 Mixed incontinence: Secondary | ICD-10-CM | POA: Diagnosis not present

## 2020-02-10 DIAGNOSIS — Z79899 Other long term (current) drug therapy: Secondary | ICD-10-CM | POA: Diagnosis not present

## 2020-02-10 DIAGNOSIS — M0589 Other rheumatoid arthritis with rheumatoid factor of multiple sites: Secondary | ICD-10-CM | POA: Diagnosis not present

## 2020-03-11 ENCOUNTER — Other Ambulatory Visit: Payer: Self-pay | Admitting: Cardiovascular Disease

## 2020-04-07 DIAGNOSIS — R102 Pelvic and perineal pain: Secondary | ICD-10-CM | POA: Diagnosis not present

## 2020-04-07 DIAGNOSIS — R350 Frequency of micturition: Secondary | ICD-10-CM | POA: Diagnosis not present

## 2020-04-07 DIAGNOSIS — N3946 Mixed incontinence: Secondary | ICD-10-CM | POA: Diagnosis not present

## 2020-04-07 DIAGNOSIS — N9489 Other specified conditions associated with female genital organs and menstrual cycle: Secondary | ICD-10-CM | POA: Diagnosis not present

## 2020-04-09 DIAGNOSIS — M0540 Rheumatoid myopathy with rheumatoid arthritis of unspecified site: Secondary | ICD-10-CM | POA: Diagnosis not present

## 2020-04-09 DIAGNOSIS — I10 Essential (primary) hypertension: Secondary | ICD-10-CM | POA: Diagnosis not present

## 2020-04-09 DIAGNOSIS — E785 Hyperlipidemia, unspecified: Secondary | ICD-10-CM | POA: Diagnosis not present

## 2020-04-09 DIAGNOSIS — C50911 Malignant neoplasm of unspecified site of right female breast: Secondary | ICD-10-CM | POA: Diagnosis not present

## 2020-04-09 DIAGNOSIS — M8580 Other specified disorders of bone density and structure, unspecified site: Secondary | ICD-10-CM | POA: Diagnosis not present

## 2020-04-14 DIAGNOSIS — Z885 Allergy status to narcotic agent status: Secondary | ICD-10-CM | POA: Diagnosis not present

## 2020-04-14 DIAGNOSIS — Z1231 Encounter for screening mammogram for malignant neoplasm of breast: Secondary | ICD-10-CM | POA: Diagnosis not present

## 2020-04-14 DIAGNOSIS — D0511 Intraductal carcinoma in situ of right breast: Secondary | ICD-10-CM | POA: Diagnosis not present

## 2020-04-14 DIAGNOSIS — Z853 Personal history of malignant neoplasm of breast: Secondary | ICD-10-CM | POA: Diagnosis not present

## 2020-04-14 DIAGNOSIS — Z888 Allergy status to other drugs, medicaments and biological substances status: Secondary | ICD-10-CM | POA: Diagnosis not present

## 2020-04-14 DIAGNOSIS — R922 Inconclusive mammogram: Secondary | ICD-10-CM | POA: Diagnosis not present

## 2020-04-14 DIAGNOSIS — Z9011 Acquired absence of right breast and nipple: Secondary | ICD-10-CM | POA: Diagnosis not present

## 2020-04-14 DIAGNOSIS — Z08 Encounter for follow-up examination after completed treatment for malignant neoplasm: Secondary | ICD-10-CM | POA: Diagnosis not present

## 2020-04-14 DIAGNOSIS — C50011 Malignant neoplasm of nipple and areola, right female breast: Secondary | ICD-10-CM | POA: Diagnosis not present

## 2020-04-19 DIAGNOSIS — I10 Essential (primary) hypertension: Secondary | ICD-10-CM | POA: Diagnosis not present

## 2020-04-19 DIAGNOSIS — R609 Edema, unspecified: Secondary | ICD-10-CM | POA: Diagnosis not present

## 2020-04-19 DIAGNOSIS — M0540 Rheumatoid myopathy with rheumatoid arthritis of unspecified site: Secondary | ICD-10-CM | POA: Diagnosis not present

## 2020-04-19 DIAGNOSIS — G2581 Restless legs syndrome: Secondary | ICD-10-CM | POA: Diagnosis not present

## 2020-04-19 DIAGNOSIS — G629 Polyneuropathy, unspecified: Secondary | ICD-10-CM | POA: Diagnosis not present

## 2020-04-19 DIAGNOSIS — C50911 Malignant neoplasm of unspecified site of right female breast: Secondary | ICD-10-CM | POA: Diagnosis not present

## 2020-04-20 DIAGNOSIS — R350 Frequency of micturition: Secondary | ICD-10-CM | POA: Diagnosis not present

## 2020-04-20 DIAGNOSIS — R102 Pelvic and perineal pain: Secondary | ICD-10-CM | POA: Diagnosis not present

## 2020-04-20 DIAGNOSIS — N9489 Other specified conditions associated with female genital organs and menstrual cycle: Secondary | ICD-10-CM | POA: Diagnosis not present

## 2020-04-20 DIAGNOSIS — N3946 Mixed incontinence: Secondary | ICD-10-CM | POA: Diagnosis not present

## 2020-05-04 DIAGNOSIS — R102 Pelvic and perineal pain: Secondary | ICD-10-CM | POA: Diagnosis not present

## 2020-05-04 DIAGNOSIS — N9489 Other specified conditions associated with female genital organs and menstrual cycle: Secondary | ICD-10-CM | POA: Diagnosis not present

## 2020-05-04 DIAGNOSIS — N3946 Mixed incontinence: Secondary | ICD-10-CM | POA: Diagnosis not present

## 2020-05-04 DIAGNOSIS — R350 Frequency of micturition: Secondary | ICD-10-CM | POA: Diagnosis not present

## 2020-06-01 DIAGNOSIS — M0589 Other rheumatoid arthritis with rheumatoid factor of multiple sites: Secondary | ICD-10-CM | POA: Diagnosis not present

## 2020-06-01 DIAGNOSIS — Z79899 Other long term (current) drug therapy: Secondary | ICD-10-CM | POA: Diagnosis not present

## 2020-06-21 DIAGNOSIS — E785 Hyperlipidemia, unspecified: Secondary | ICD-10-CM | POA: Diagnosis not present

## 2020-06-21 DIAGNOSIS — C50911 Malignant neoplasm of unspecified site of right female breast: Secondary | ICD-10-CM | POA: Diagnosis not present

## 2020-06-21 DIAGNOSIS — G629 Polyneuropathy, unspecified: Secondary | ICD-10-CM | POA: Diagnosis not present

## 2020-06-21 DIAGNOSIS — G2581 Restless legs syndrome: Secondary | ICD-10-CM | POA: Diagnosis not present

## 2020-06-21 DIAGNOSIS — M25552 Pain in left hip: Secondary | ICD-10-CM | POA: Diagnosis not present

## 2020-06-21 DIAGNOSIS — M0540 Rheumatoid myopathy with rheumatoid arthritis of unspecified site: Secondary | ICD-10-CM | POA: Diagnosis not present

## 2020-07-01 ENCOUNTER — Other Ambulatory Visit: Payer: Self-pay | Admitting: Oncology

## 2020-07-19 DIAGNOSIS — C50911 Malignant neoplasm of unspecified site of right female breast: Secondary | ICD-10-CM | POA: Diagnosis not present

## 2020-07-19 DIAGNOSIS — M0540 Rheumatoid myopathy with rheumatoid arthritis of unspecified site: Secondary | ICD-10-CM | POA: Diagnosis not present

## 2020-07-19 DIAGNOSIS — I10 Essential (primary) hypertension: Secondary | ICD-10-CM | POA: Diagnosis not present

## 2020-07-19 DIAGNOSIS — E785 Hyperlipidemia, unspecified: Secondary | ICD-10-CM | POA: Diagnosis not present

## 2020-07-19 DIAGNOSIS — M8580 Other specified disorders of bone density and structure, unspecified site: Secondary | ICD-10-CM | POA: Diagnosis not present

## 2020-07-27 DIAGNOSIS — M0589 Other rheumatoid arthritis with rheumatoid factor of multiple sites: Secondary | ICD-10-CM | POA: Diagnosis not present

## 2020-09-11 DIAGNOSIS — C50911 Malignant neoplasm of unspecified site of right female breast: Secondary | ICD-10-CM | POA: Diagnosis not present

## 2020-09-11 DIAGNOSIS — E785 Hyperlipidemia, unspecified: Secondary | ICD-10-CM | POA: Diagnosis not present

## 2020-09-11 DIAGNOSIS — K219 Gastro-esophageal reflux disease without esophagitis: Secondary | ICD-10-CM | POA: Diagnosis not present

## 2020-09-11 DIAGNOSIS — I10 Essential (primary) hypertension: Secondary | ICD-10-CM | POA: Diagnosis not present

## 2020-09-11 DIAGNOSIS — M0540 Rheumatoid myopathy with rheumatoid arthritis of unspecified site: Secondary | ICD-10-CM | POA: Diagnosis not present

## 2020-09-11 DIAGNOSIS — M8580 Other specified disorders of bone density and structure, unspecified site: Secondary | ICD-10-CM | POA: Diagnosis not present

## 2020-09-21 DIAGNOSIS — M0589 Other rheumatoid arthritis with rheumatoid factor of multiple sites: Secondary | ICD-10-CM | POA: Diagnosis not present

## 2020-10-26 DIAGNOSIS — Z79899 Other long term (current) drug therapy: Secondary | ICD-10-CM | POA: Diagnosis not present

## 2020-10-26 DIAGNOSIS — Z6820 Body mass index (BMI) 20.0-20.9, adult: Secondary | ICD-10-CM | POA: Diagnosis not present

## 2020-10-26 DIAGNOSIS — M0589 Other rheumatoid arthritis with rheumatoid factor of multiple sites: Secondary | ICD-10-CM | POA: Diagnosis not present

## 2020-10-26 DIAGNOSIS — M2550 Pain in unspecified joint: Secondary | ICD-10-CM | POA: Diagnosis not present

## 2020-10-27 DIAGNOSIS — E785 Hyperlipidemia, unspecified: Secondary | ICD-10-CM | POA: Diagnosis not present

## 2020-11-16 DIAGNOSIS — Z79899 Other long term (current) drug therapy: Secondary | ICD-10-CM | POA: Diagnosis not present

## 2020-11-16 DIAGNOSIS — M0589 Other rheumatoid arthritis with rheumatoid factor of multiple sites: Secondary | ICD-10-CM | POA: Diagnosis not present

## 2020-11-16 DIAGNOSIS — R5383 Other fatigue: Secondary | ICD-10-CM | POA: Diagnosis not present

## 2020-11-16 DIAGNOSIS — Z111 Encounter for screening for respiratory tuberculosis: Secondary | ICD-10-CM | POA: Diagnosis not present

## 2020-11-22 DIAGNOSIS — M8580 Other specified disorders of bone density and structure, unspecified site: Secondary | ICD-10-CM | POA: Diagnosis not present

## 2020-11-22 DIAGNOSIS — C50911 Malignant neoplasm of unspecified site of right female breast: Secondary | ICD-10-CM | POA: Diagnosis not present

## 2020-11-22 DIAGNOSIS — K219 Gastro-esophageal reflux disease without esophagitis: Secondary | ICD-10-CM | POA: Diagnosis not present

## 2020-11-22 DIAGNOSIS — I10 Essential (primary) hypertension: Secondary | ICD-10-CM | POA: Diagnosis not present

## 2020-11-22 DIAGNOSIS — M0540 Rheumatoid myopathy with rheumatoid arthritis of unspecified site: Secondary | ICD-10-CM | POA: Diagnosis not present

## 2020-11-22 DIAGNOSIS — E785 Hyperlipidemia, unspecified: Secondary | ICD-10-CM | POA: Diagnosis not present

## 2020-11-25 DIAGNOSIS — K219 Gastro-esophageal reflux disease without esophagitis: Secondary | ICD-10-CM | POA: Diagnosis not present

## 2020-11-25 DIAGNOSIS — M8580 Other specified disorders of bone density and structure, unspecified site: Secondary | ICD-10-CM | POA: Diagnosis not present

## 2020-11-25 DIAGNOSIS — I10 Essential (primary) hypertension: Secondary | ICD-10-CM | POA: Diagnosis not present

## 2020-11-25 DIAGNOSIS — M0540 Rheumatoid myopathy with rheumatoid arthritis of unspecified site: Secondary | ICD-10-CM | POA: Diagnosis not present

## 2020-11-25 DIAGNOSIS — E785 Hyperlipidemia, unspecified: Secondary | ICD-10-CM | POA: Diagnosis not present

## 2020-11-25 DIAGNOSIS — C50911 Malignant neoplasm of unspecified site of right female breast: Secondary | ICD-10-CM | POA: Diagnosis not present

## 2020-12-03 DIAGNOSIS — H18413 Arcus senilis, bilateral: Secondary | ICD-10-CM | POA: Diagnosis not present

## 2020-12-03 DIAGNOSIS — H25043 Posterior subcapsular polar age-related cataract, bilateral: Secondary | ICD-10-CM | POA: Diagnosis not present

## 2020-12-03 DIAGNOSIS — H2511 Age-related nuclear cataract, right eye: Secondary | ICD-10-CM | POA: Diagnosis not present

## 2020-12-03 DIAGNOSIS — H25013 Cortical age-related cataract, bilateral: Secondary | ICD-10-CM | POA: Diagnosis not present

## 2020-12-03 DIAGNOSIS — H40013 Open angle with borderline findings, low risk, bilateral: Secondary | ICD-10-CM | POA: Diagnosis not present

## 2020-12-03 DIAGNOSIS — H2513 Age-related nuclear cataract, bilateral: Secondary | ICD-10-CM | POA: Diagnosis not present

## 2021-01-01 DIAGNOSIS — K219 Gastro-esophageal reflux disease without esophagitis: Secondary | ICD-10-CM | POA: Diagnosis not present

## 2021-01-01 DIAGNOSIS — M8580 Other specified disorders of bone density and structure, unspecified site: Secondary | ICD-10-CM | POA: Diagnosis not present

## 2021-01-01 DIAGNOSIS — I10 Essential (primary) hypertension: Secondary | ICD-10-CM | POA: Diagnosis not present

## 2021-01-01 DIAGNOSIS — C50911 Malignant neoplasm of unspecified site of right female breast: Secondary | ICD-10-CM | POA: Diagnosis not present

## 2021-01-01 DIAGNOSIS — E785 Hyperlipidemia, unspecified: Secondary | ICD-10-CM | POA: Diagnosis not present

## 2021-01-01 DIAGNOSIS — M0540 Rheumatoid myopathy with rheumatoid arthritis of unspecified site: Secondary | ICD-10-CM | POA: Diagnosis not present

## 2021-01-12 DIAGNOSIS — M0589 Other rheumatoid arthritis with rheumatoid factor of multiple sites: Secondary | ICD-10-CM | POA: Diagnosis not present

## 2021-01-17 DIAGNOSIS — H2511 Age-related nuclear cataract, right eye: Secondary | ICD-10-CM | POA: Diagnosis not present

## 2021-01-17 DIAGNOSIS — H52201 Unspecified astigmatism, right eye: Secondary | ICD-10-CM | POA: Diagnosis not present

## 2021-01-18 DIAGNOSIS — H2512 Age-related nuclear cataract, left eye: Secondary | ICD-10-CM | POA: Diagnosis not present

## 2021-01-24 DIAGNOSIS — Z79899 Other long term (current) drug therapy: Secondary | ICD-10-CM | POA: Diagnosis not present

## 2021-01-24 DIAGNOSIS — C50911 Malignant neoplasm of unspecified site of right female breast: Secondary | ICD-10-CM | POA: Diagnosis not present

## 2021-01-24 DIAGNOSIS — G629 Polyneuropathy, unspecified: Secondary | ICD-10-CM | POA: Diagnosis not present

## 2021-01-24 DIAGNOSIS — I10 Essential (primary) hypertension: Secondary | ICD-10-CM | POA: Diagnosis not present

## 2021-01-24 DIAGNOSIS — M0540 Rheumatoid myopathy with rheumatoid arthritis of unspecified site: Secondary | ICD-10-CM | POA: Diagnosis not present

## 2021-01-24 DIAGNOSIS — Z0000 Encounter for general adult medical examination without abnormal findings: Secondary | ICD-10-CM | POA: Diagnosis not present

## 2021-01-24 DIAGNOSIS — K219 Gastro-esophageal reflux disease without esophagitis: Secondary | ICD-10-CM | POA: Diagnosis not present

## 2021-01-24 DIAGNOSIS — E785 Hyperlipidemia, unspecified: Secondary | ICD-10-CM | POA: Diagnosis not present

## 2021-01-24 DIAGNOSIS — M8580 Other specified disorders of bone density and structure, unspecified site: Secondary | ICD-10-CM | POA: Diagnosis not present

## 2021-01-24 DIAGNOSIS — I700 Atherosclerosis of aorta: Secondary | ICD-10-CM | POA: Diagnosis not present

## 2021-02-07 DIAGNOSIS — H52202 Unspecified astigmatism, left eye: Secondary | ICD-10-CM | POA: Diagnosis not present

## 2021-02-07 DIAGNOSIS — H2512 Age-related nuclear cataract, left eye: Secondary | ICD-10-CM | POA: Diagnosis not present

## 2021-02-23 DIAGNOSIS — M159 Polyosteoarthritis, unspecified: Secondary | ICD-10-CM | POA: Diagnosis not present

## 2021-02-23 DIAGNOSIS — M2550 Pain in unspecified joint: Secondary | ICD-10-CM | POA: Diagnosis not present

## 2021-02-23 DIAGNOSIS — Z6820 Body mass index (BMI) 20.0-20.9, adult: Secondary | ICD-10-CM | POA: Diagnosis not present

## 2021-02-23 DIAGNOSIS — M0589 Other rheumatoid arthritis with rheumatoid factor of multiple sites: Secondary | ICD-10-CM | POA: Diagnosis not present

## 2021-02-23 DIAGNOSIS — Z79899 Other long term (current) drug therapy: Secondary | ICD-10-CM | POA: Diagnosis not present

## 2021-03-02 DIAGNOSIS — D225 Melanocytic nevi of trunk: Secondary | ICD-10-CM | POA: Diagnosis not present

## 2021-03-02 DIAGNOSIS — X32XXXD Exposure to sunlight, subsequent encounter: Secondary | ICD-10-CM | POA: Diagnosis not present

## 2021-03-02 DIAGNOSIS — L308 Other specified dermatitis: Secondary | ICD-10-CM | POA: Diagnosis not present

## 2021-03-02 DIAGNOSIS — B078 Other viral warts: Secondary | ICD-10-CM | POA: Diagnosis not present

## 2021-03-02 DIAGNOSIS — L570 Actinic keratosis: Secondary | ICD-10-CM | POA: Diagnosis not present

## 2021-03-02 DIAGNOSIS — Z1283 Encounter for screening for malignant neoplasm of skin: Secondary | ICD-10-CM | POA: Diagnosis not present

## 2021-03-15 DIAGNOSIS — M0589 Other rheumatoid arthritis with rheumatoid factor of multiple sites: Secondary | ICD-10-CM | POA: Diagnosis not present

## 2021-04-01 DIAGNOSIS — C50911 Malignant neoplasm of unspecified site of right female breast: Secondary | ICD-10-CM | POA: Diagnosis not present

## 2021-04-01 DIAGNOSIS — M8580 Other specified disorders of bone density and structure, unspecified site: Secondary | ICD-10-CM | POA: Diagnosis not present

## 2021-04-01 DIAGNOSIS — I10 Essential (primary) hypertension: Secondary | ICD-10-CM | POA: Diagnosis not present

## 2021-04-01 DIAGNOSIS — E785 Hyperlipidemia, unspecified: Secondary | ICD-10-CM | POA: Diagnosis not present

## 2021-04-01 DIAGNOSIS — K219 Gastro-esophageal reflux disease without esophagitis: Secondary | ICD-10-CM | POA: Diagnosis not present

## 2021-04-01 DIAGNOSIS — M0540 Rheumatoid myopathy with rheumatoid arthritis of unspecified site: Secondary | ICD-10-CM | POA: Diagnosis not present

## 2021-04-04 DIAGNOSIS — Z421 Encounter for breast reconstruction following mastectomy: Secondary | ICD-10-CM | POA: Diagnosis not present

## 2021-04-09 DIAGNOSIS — U071 COVID-19: Secondary | ICD-10-CM | POA: Diagnosis not present

## 2021-04-18 DIAGNOSIS — Z08 Encounter for follow-up examination after completed treatment for malignant neoplasm: Secondary | ICD-10-CM | POA: Diagnosis not present

## 2021-04-18 DIAGNOSIS — Z1231 Encounter for screening mammogram for malignant neoplasm of breast: Secondary | ICD-10-CM | POA: Diagnosis not present

## 2021-04-18 DIAGNOSIS — Z853 Personal history of malignant neoplasm of breast: Secondary | ICD-10-CM | POA: Diagnosis not present

## 2021-04-18 DIAGNOSIS — D0511 Intraductal carcinoma in situ of right breast: Secondary | ICD-10-CM | POA: Diagnosis not present

## 2021-04-18 DIAGNOSIS — Z9013 Acquired absence of bilateral breasts and nipples: Secondary | ICD-10-CM | POA: Diagnosis not present

## 2021-04-26 DIAGNOSIS — M0589 Other rheumatoid arthritis with rheumatoid factor of multiple sites: Secondary | ICD-10-CM | POA: Diagnosis not present

## 2021-05-17 DIAGNOSIS — L570 Actinic keratosis: Secondary | ICD-10-CM | POA: Diagnosis not present

## 2021-05-17 DIAGNOSIS — X32XXXD Exposure to sunlight, subsequent encounter: Secondary | ICD-10-CM | POA: Diagnosis not present

## 2021-05-17 DIAGNOSIS — L820 Inflamed seborrheic keratosis: Secondary | ICD-10-CM | POA: Diagnosis not present

## 2021-05-17 DIAGNOSIS — D225 Melanocytic nevi of trunk: Secondary | ICD-10-CM | POA: Diagnosis not present

## 2021-05-17 DIAGNOSIS — L821 Other seborrheic keratosis: Secondary | ICD-10-CM | POA: Diagnosis not present

## 2021-06-07 DIAGNOSIS — M0589 Other rheumatoid arthritis with rheumatoid factor of multiple sites: Secondary | ICD-10-CM | POA: Diagnosis not present

## 2021-06-29 DIAGNOSIS — Z79899 Other long term (current) drug therapy: Secondary | ICD-10-CM | POA: Diagnosis not present

## 2021-06-29 DIAGNOSIS — G5793 Unspecified mononeuropathy of bilateral lower limbs: Secondary | ICD-10-CM | POA: Diagnosis not present

## 2021-06-29 DIAGNOSIS — M2550 Pain in unspecified joint: Secondary | ICD-10-CM | POA: Diagnosis not present

## 2021-06-29 DIAGNOSIS — M159 Polyosteoarthritis, unspecified: Secondary | ICD-10-CM | POA: Diagnosis not present

## 2021-06-29 DIAGNOSIS — M0589 Other rheumatoid arthritis with rheumatoid factor of multiple sites: Secondary | ICD-10-CM | POA: Diagnosis not present

## 2021-06-29 DIAGNOSIS — Z6820 Body mass index (BMI) 20.0-20.9, adult: Secondary | ICD-10-CM | POA: Diagnosis not present

## 2021-07-19 DIAGNOSIS — M0589 Other rheumatoid arthritis with rheumatoid factor of multiple sites: Secondary | ICD-10-CM | POA: Diagnosis not present

## 2021-08-16 DIAGNOSIS — M0589 Other rheumatoid arthritis with rheumatoid factor of multiple sites: Secondary | ICD-10-CM | POA: Diagnosis not present

## 2021-09-13 DIAGNOSIS — R7989 Other specified abnormal findings of blood chemistry: Secondary | ICD-10-CM | POA: Diagnosis not present

## 2021-09-13 DIAGNOSIS — M0589 Other rheumatoid arthritis with rheumatoid factor of multiple sites: Secondary | ICD-10-CM | POA: Diagnosis not present

## 2021-10-18 DIAGNOSIS — M0589 Other rheumatoid arthritis with rheumatoid factor of multiple sites: Secondary | ICD-10-CM | POA: Diagnosis not present

## 2021-10-27 ENCOUNTER — Ambulatory Visit (INDEPENDENT_AMBULATORY_CARE_PROVIDER_SITE_OTHER): Payer: Medicare Other | Admitting: Cardiovascular Disease

## 2021-10-27 ENCOUNTER — Other Ambulatory Visit: Payer: Self-pay

## 2021-10-27 ENCOUNTER — Encounter: Payer: Self-pay | Admitting: Cardiovascular Disease

## 2021-10-27 VITALS — BP 130/64 | HR 59 | Ht 68.0 in | Wt 137.0 lb

## 2021-10-27 DIAGNOSIS — M069 Rheumatoid arthritis, unspecified: Secondary | ICD-10-CM | POA: Diagnosis not present

## 2021-10-27 DIAGNOSIS — E7800 Pure hypercholesterolemia, unspecified: Secondary | ICD-10-CM | POA: Diagnosis not present

## 2021-10-27 DIAGNOSIS — E785 Hyperlipidemia, unspecified: Secondary | ICD-10-CM

## 2021-10-27 DIAGNOSIS — I10 Essential (primary) hypertension: Secondary | ICD-10-CM

## 2021-10-27 DIAGNOSIS — I700 Atherosclerosis of aorta: Secondary | ICD-10-CM

## 2021-10-27 NOTE — Patient Instructions (Signed)
Medication Instructions:  No changes *If you need a refill on your cardiac medications before your next appointment, please call your pharmacy*   Lab Work: None ordered If you have labs (blood work) drawn today and your tests are completely normal, you will receive your results only by: [place] (if you have MyChart) OR A paper copy in the mail If you have any lab test that is abnormal or we need to change your treatment, we will call you to review the results.   Testing/Procedures: Dr. Sallyanne Kuster has ordered a CT coronary calcium score.   Test locations:  [facility] ([address] [place], [place] [address]) [facility]  ([address] [place], [place] [address])   This is $99 out of pocket.   Coronary CalciumScan A coronary calcium scan is an imaging test used to look for deposits of calcium and other fatty materials (plaques) in the inner lining of the blood vessels of the heart (coronary arteries). These deposits of calcium and plaques can partly clog and narrow the coronary arteries without producing any symptoms or warning signs. This puts a person at risk for a heart attack. This test can detect these deposits before symptoms develop. Tell a health care provider about: Any allergies you have. All medicines you are taking, including vitamins, herbs, eye drops, creams, and over-the-counter medicines. Any problems you or family members have had with anesthetic medicines. Any blood disorders you have. Any surgeries you have had. Any medical conditions you have. Whether you are pregnant or may be pregnant. What are the risks? Generally, this is a safe procedure. However, problems may occur, including: Harm to a pregnant woman and her unborn baby. This test involves the use of radiation. Radiation exposure can be dangerous to a pregnant woman and her unborn baby. If you are pregnant, you generally should not have this procedure done. Slight increase in  the risk of cancer. This is because of the radiation involved in the test. What happens before the procedure? No preparation is needed for this procedure. What happens during the procedure? You will undress and remove any jewelry around your neck or chest. You will put on a hospital gown. Sticky electrodes will be placed on your chest. The electrodes will be connected to an electrocardiogram (ECG) machine to record a tracing of the electrical activity of your heart. A CT scanner will take pictures of your heart. During this time, you will be asked to lie still and hold your breath for 2-3 seconds while a picture of your heart is being taken. The procedure may vary among health care providers and hospitals. What happens after the procedure? You can get dressed. You can return to your normal activities. It is up to you to get the results of your test. Ask your health care provider, or the department that is doing the test, when your results will be ready. Summary A coronary calcium scan is an imaging test used to look for deposits of calcium and other fatty materials (plaques) in the inner lining of the blood vessels of the heart (coronary arteries). Generally, this is a safe procedure. Tell your health care provider if you are pregnant or may be pregnant. No preparation is needed for this procedure. A CT scanner will take pictures of your heart. You can return to your normal activities after the scan is done. This information is not intended to replace advice given to you by your health care provider. Make sure you discuss any questions you have  with your health care provider. Document Released: 03/09/2008 Document Revised: 07/31/2016 Document Reviewed: 07/31/2016 Elsevier Interactive Patient Education  2017 [place]: At [facility], you and your health needs are our priority.  As part of our continuing mission to provide you with exceptional heart care, we have created  designated Provider Care Teams.  These Care Teams include your primary Cardiologist (physician) and Advanced Practice Providers (APPs -  Physician Assistants and Nurse Practitioners) who all work together to provide you with the care you need, when you need it.  We recommend signing up for the patient portal called "MyChart".  Sign up information is provided on this After Visit Summary.  MyChart is used to connect with patients for Virtual Visits (Telemedicine).  Patients are able to view lab/test results, encounter notes, upcoming appointments, etc.  Non-urgent messages can be sent to your provider as well.   To learn more about what you can do with MyChart, go to [email or web address].    Your next appointment:   12 month(s)  The format for your next appointment:   In Person  Provider:   Sanda Klein, MD

## 2021-10-27 NOTE — Progress Notes (Signed)
Cardiology office note   Date:  10/28/2021   ID:  Patricia, Long January 12, 1951, MRN [id number]  PCP:  Patricia Huddle, MD  Cardiologist:  Patricia Long Electrophysiologist:  None   Evaluation Performed:  Follow-Up Visit  Chief Complaint: Hypercholesterolemia, hypertension, aortic atherosclerosis  History of Present Illness:    Patricia Long is a 71 y.o. female with moderate hypercholesterolemia, systolic hypertension and aortic atherosclerosis, but without known symptomatic coronary or peripheral vascular disease.  Her brother Patricia Long is also my patient.  This is her first follow-up visit since 2020.  Not long after that last appointment, her husband passed away due to complications related to aortic aneurysm.  She misses her husband, but seems to be adjusting to her loss reasonably well, with support from friends and family.  She continues to live in the same home.  She has not had any serious new health challenges of her own.  She underwent a [facility] screening test in January 2022.  This showed no evidence of carotid disease or aortic aneurysm and her lower extremity ABI testing was normal.  Her ECG showed an incomplete right bundle branch block, which is not a new finding (the QRS measures 106 ms on today's tracing, was 112 in 2018).  On the same [facility] screening test she had a lipid profile checked with a total cholesterol of 194, HDL 52, LDL 125, triglycerides 83.  This is on treatment with ezetimibe 10 mg daily.  She has a history of severe myalgia with both rosuvastatin and atorvastatin.  Imaging studies have shown evidence of aortic atherosclerosis, but with normal caliber aorta and without any detectable coronary or peripheral arterial disease of clinical importance.  Her blood pressure has been well controlled.  The patient specifically denies any chest pain at rest exertion, dyspnea at rest or with exertion, orthopnea, paroxysmal nocturnal dyspnea, syncope,  palpitations, focal neurological deficits, intermittent claudication, lower extremity edema, unexplained weight gain, cough, hemoptysis or wheezing.   Past Medical History:  Diagnosis Date   Arthritis    RA   Basal cell carcinoma ~ 2013   "legs"   Cancer ([place])    right breast cancer   Cataract    Chronic bronchitis ([place])    "I've had it several times but not q yr" (01/13/2015)   Endometriosis    Fibroid    Hypertension    Irritable bowel syndrome    Pneumonia 1970's X 1; ~ 2013   PONV (postoperative nausea and vomiting)    Rheumatoid arthritis(714.0)    Past Surgical History:  Procedure Laterality Date   BASAL CELL CARCINOMA EXCISION  ~ 2013   "legs"   BREAST CYST EXCISION Bilateral 1992   NODULE EXCISION OF RIGHT AND LEFT BREAST   BREAST SURGERY     and reconstruction 2020   COLONOSCOPY     FOOT SURGERY Bilateral ~ 2006-2008   "reconstruction"   JOINT REPLACEMENT Left 2009   WRIST AND FINGER    KNEE ARTHROSCOPY Right 1980's   PELVIC LAPAROSCOPY  '82 AND '87   TOTAL ABDOMINAL HYSTERECTOMY  1997   TAH/BSO     Current Meds  Medication Sig   amLODipine (NORVASC) 10 MG tablet TAKE 1 TABLET BY MOUTH  DAILY   cetirizine (ZYRTEC) 10 MG tablet Take 10 mg by mouth daily as needed (remicaide infusion).   Coenzyme Q10 200 MG capsule Take 1 capsule (200 mg total) by mouth daily.   ezetimibe (ZETIA) 10 MG tablet    folic acid (  FOLVITE) 1 MG tablet Take 1 mg by mouth daily.   gabapentin (NEURONTIN) 300 MG capsule Take 1 capsule (300 mg total) by mouth at bedtime.   hydroxychloroquine (PLAQUENIL) 200 MG tablet Take 200 mg by mouth daily.   inFLIXimab (REMICADE) 100 MG injection Inject 100 mg into the vein every 8 (eight) weeks.   Methotrexate, Anti-Rheumatic, (METHOTREXATE, PF, [place]) Inject 20 mg into the skin once a week.   Multiple Vitamin (MULTIVITAMIN) tablet Take 1 tablet by mouth daily.   pramipexole (MIRAPEX) 0.125 MG tablet    traZODone (DESYREL) 50 MG tablet as directed.    valsartan (DIOVAN) 80 MG tablet Take 80 mg by mouth daily.     Allergies:   Prochlorperazine edisylate, Prochlorperazine edisylate, Codeine, Prochlorperazine, and Tape   Social History   Tobacco Use   Smoking status: Never   Smokeless tobacco: Never  Vaping Use   Vaping Use: Never used  Substance Use Topics   Alcohol use: Yes    Alcohol/week: 1.0 standard drink    Types: 1 Glasses of wine per week   Drug use: No     Family Hx: The patient's family history includes Breast cancer (age of onset: 44) in her maternal aunt; Colon cancer in her maternal aunt and paternal uncle; Heart attack in her father and mother; Heart disease in her father and mother. There is no history of Colon polyps, Diabetes, Kidney disease, Esophageal cancer, Stomach cancer, or Rectal cancer.  ROS:   Please see the history of present illness.     All other systems reviewed and are negative.  Prior CV studies:   The following studies were reviewed today:  January 2022 Cholesterol 194, HDL 52, LDL 125, triglycerides 83 Line testing with normal carotid scan and normal ABI of the lower extremities Labs/Other Tests and Data Reviewed:    EKG: Ordered today and personally reviewed shows sinus bradycardia at 59 bpm and an incomplete right bundle branch block with a QRS of 106 ms duration.  There are no repolarization abnormalities.  Recent Labs: Labs from PCP from January 21, 2019 Normal liver function tests, CRP 1.0, creatinine 0.89, hemoglobin 13.6 Total cholesterol 241, HDL 76, LDL 127, triglycerides 191  Recent Lipid Panel Lab Results  Component Value Date/Time   CHOL 259 (H) 09/21/2017 11:19 AM   TRIG 45 09/21/2017 11:19 AM   HDL 108 09/21/2017 11:19 AM   CHOLHDL 2.4 09/21/2017 11:19 AM   LDLCALC 142 (H) 09/21/2017 11:19 AM    Wt Readings from Last 3 Encounters:  10/27/21 137 lb (62.1 kg)  01/20/20 132 lb (59.9 kg)  10/09/19 135 lb (61.2 kg)     Objective:    Vital Signs:  BP 130/64     Pulse (!) 59    Ht 5\' 8"  (1.727 m)    Wt 137 lb (62.1 kg)    SpO2 98%    BMI 20.83 kg/m     General: Alert, oriented x3, no distress, very lean Head: no evidence of trauma, PERRL, EOMI, no exophtalmos or lid lag, no myxedema, no xanthelasma; normal ears, nose and oropharynx Neck: normal jugular venous pulsations and no hepatojugular reflux; brisk carotid pulses without delay and no carotid bruits Chest: clear to auscultation, no signs of consolidation by percussion or palpation, normal fremitus, symmetrical and full respiratory excursions Cardiovascular: normal position and quality of the apical impulse, regular rhythm, normal first and second heart sounds, no murmurs, rubs or gallops Abdomen: no tenderness or distention, no masses by palpation, no  abnormal pulsatility or arterial bruits, normal bowel sounds, no hepatosplenomegaly Extremities: no clubbing, cyanosis or edema; 2+ radial, ulnar and brachial pulses bilaterally; 2+ right femoral, posterior tibial and dorsalis pedis pulses; 2+ left femoral, posterior tibial and dorsalis pedis pulses; no subclavian or femoral bruits Neurological: grossly nonfocal Psych: Normal mood and affect   ASSESSMENT & PLAN:    1. Essential hypertension   2. Pure hypercholesterolemia   3. Aortic atherosclerosis ([place])   4. Rheumatoid arthritis involving multiple sites, unspecified whether rheumatoid factor present ([place])      HTN: Well-controlled on the current medical regimen. HLP:  she has not tolerated either rosuvastatin (even prescribed intermittently and with co-Q10) or atorvastatin.  She has a very healthy lifestyle and is very lean.  Zetia is not getting her to target.  We could consider pravastatin, but this is unlikely to get her to target and is unlikely to be tolerated either.  Suggested getting a calcium score to see how aggressive we really need to be in lowering her cholesterol.  Prefers calcium score is high we may be able to get insurance  coverage for PCSK9 inhibitors.  If her calcium score is low, we may just leave her on the current regimen Aortic atherosclerosis: She has no symptoms of coronary or peripheral vascular disease and has never had a CVA/TIA, but this marks her at risk for development of symptomatic vascular complications.  Check calcium score RA: On methotrexate and hydroxychloroquine.  Not requiring steroids.  Patient Instructions  Medication Instructions:  No changes *If you need a refill on your cardiac medications before your next appointment, please call your pharmacy*   Lab Work: None ordered If you have labs (blood work) drawn today and your tests are completely normal, you will receive your results only by: [place] (if you have MyChart) OR A paper copy in the mail If you have any lab test that is abnormal or we need to change your treatment, we will call you to review the results.   Testing/Procedures: Dr. Sallyanne Kuster has ordered a CT coronary calcium score.   Test locations:  [facility] ([address] [place], [place] [address]) [facility] ([address] [place], [place] [address])   This is $99 out of pocket.   Coronary CalciumScan A coronary calcium scan is an imaging test used to look for deposits of calcium and other fatty materials (plaques) in the inner lining of the blood vessels of the heart (coronary arteries). These deposits of calcium and plaques can partly clog and narrow the coronary arteries without producing any symptoms or warning signs. This puts a person at risk for a heart attack. This test can detect these deposits before symptoms develop. Tell a health care provider about: Any allergies you have. All medicines you are taking, including vitamins, herbs, eye drops, creams, and over-the-counter medicines. Any problems you or family members have had with anesthetic medicines. Any blood disorders you have. Any surgeries you have had. Any medical  conditions you have. Whether you are pregnant or may be pregnant. What are the risks? Generally, this is a safe procedure. However, problems may occur, including: Harm to a pregnant woman and her unborn baby. This test involves the use of radiation. Radiation exposure can be dangerous to a pregnant woman and her unborn baby. If you are pregnant, you generally should not have this procedure done. Slight increase in the risk of cancer. This is because of the radiation involved in the test. What happens before the  procedure? No preparation is needed for this procedure. What happens during the procedure? You will undress and remove any jewelry around your neck or chest. You will put on a hospital gown. Sticky electrodes will be placed on your chest. The electrodes will be connected to an electrocardiogram (ECG) machine to record a tracing of the electrical activity of your heart. A CT scanner will take pictures of your heart. During this time, you will be asked to lie still and hold your breath for 2-3 seconds while a picture of your heart is being taken. The procedure may vary among health care providers and hospitals. What happens after the procedure? You can get dressed. You can return to your normal activities. It is up to you to get the results of your test. Ask your health care provider, or the department that is doing the test, when your results will be ready. Summary A coronary calcium scan is an imaging test used to look for deposits of calcium and other fatty materials (plaques) in the inner lining of the blood vessels of the heart (coronary arteries). Generally, this is a safe procedure. Tell your health care provider if you are pregnant or may be pregnant. No preparation is needed for this procedure. A CT scanner will take pictures of your heart. You can return to your normal activities after the scan is done. This information is not intended to replace advice given to you by your  health care provider. Make sure you discuss any questions you have with your health care provider. Document Released: 03/09/2008 Document Revised: 07/31/2016 Document Reviewed: 07/31/2016 Elsevier Interactive Patient Education  2017 [place]: At [facility], you and your health needs are our priority.  As part of our continuing mission to provide you with exceptional heart care, we have created designated Provider Care Teams.  These Care Teams include your primary Cardiologist (physician) and Advanced Practice Providers (APPs -  Physician Assistants and Nurse Practitioners) who all work together to provide you with the care you need, when you need it.  We recommend signing up for the patient portal called "MyChart".  Sign up information is provided on this After Visit Summary.  MyChart is used to connect with patients for Virtual Visits (Telemedicine).  Patients are able to view lab/test results, encounter notes, upcoming appointments, etc.  Non-urgent messages can be sent to your provider as well.   To learn more about what you can do with MyChart, go to [email or web address].    Your next appointment:   12 month(s)  The format for your next appointment:   In Person  Provider:   Sanda Klein, MD   Tests Ordered: Orders Placed This Encounter  Procedures   CT CARDIAC SCORING (SELF PAY ONLY)   EKG 12-Lead    Medication Changes: No orders of the defined types were placed in this encounter.   Disposition:  Follow up  1 year  Signed, Sanda Klein, MD  10/28/2021 [place]

## 2021-10-31 DIAGNOSIS — M159 Polyosteoarthritis, unspecified: Secondary | ICD-10-CM | POA: Diagnosis not present

## 2021-10-31 DIAGNOSIS — Z6820 Body mass index (BMI) 20.0-20.9, adult: Secondary | ICD-10-CM | POA: Diagnosis not present

## 2021-10-31 DIAGNOSIS — M0589 Other rheumatoid arthritis with rheumatoid factor of multiple sites: Secondary | ICD-10-CM | POA: Diagnosis not present

## 2021-10-31 DIAGNOSIS — M2550 Pain in unspecified joint: Secondary | ICD-10-CM | POA: Diagnosis not present

## 2021-10-31 DIAGNOSIS — Z79899 Other long term (current) drug therapy: Secondary | ICD-10-CM | POA: Diagnosis not present

## 2021-11-15 DIAGNOSIS — M0589 Other rheumatoid arthritis with rheumatoid factor of multiple sites: Secondary | ICD-10-CM | POA: Diagnosis not present

## 2021-11-15 DIAGNOSIS — Z79899 Other long term (current) drug therapy: Secondary | ICD-10-CM | POA: Diagnosis not present

## 2021-11-21 DIAGNOSIS — E785 Hyperlipidemia, unspecified: Secondary | ICD-10-CM | POA: Diagnosis not present

## 2021-11-21 DIAGNOSIS — I10 Essential (primary) hypertension: Secondary | ICD-10-CM | POA: Diagnosis not present

## 2021-12-13 DIAGNOSIS — Z79899 Other long term (current) drug therapy: Secondary | ICD-10-CM | POA: Diagnosis not present

## 2021-12-13 DIAGNOSIS — R5383 Other fatigue: Secondary | ICD-10-CM | POA: Diagnosis not present

## 2021-12-13 DIAGNOSIS — M0589 Other rheumatoid arthritis with rheumatoid factor of multiple sites: Secondary | ICD-10-CM | POA: Diagnosis not present

## 2021-12-13 DIAGNOSIS — Z111 Encounter for screening for respiratory tuberculosis: Secondary | ICD-10-CM | POA: Diagnosis not present

## 2021-12-19 DIAGNOSIS — M7711 Lateral epicondylitis, right elbow: Secondary | ICD-10-CM | POA: Diagnosis not present

## 2021-12-19 DIAGNOSIS — M25521 Pain in right elbow: Secondary | ICD-10-CM | POA: Diagnosis not present

## 2021-12-19 DIAGNOSIS — M06831 Other specified rheumatoid arthritis, right wrist: Secondary | ICD-10-CM | POA: Diagnosis not present

## 2021-12-19 DIAGNOSIS — M79641 Pain in right hand: Secondary | ICD-10-CM | POA: Diagnosis not present

## 2021-12-19 DIAGNOSIS — M79642 Pain in left hand: Secondary | ICD-10-CM | POA: Diagnosis not present

## 2021-12-19 DIAGNOSIS — M069 Rheumatoid arthritis, unspecified: Secondary | ICD-10-CM | POA: Insufficient documentation

## 2021-12-21 ENCOUNTER — Ambulatory Visit (INDEPENDENT_AMBULATORY_CARE_PROVIDER_SITE_OTHER)
Admission: RE | Admit: 2021-12-21 | Discharge: 2021-12-21 | Disposition: A | Discharge status: Home / Self Care | Payer: Self-pay | Source: Ambulatory Visit | Attending: Cardiovascular Disease | Admitting: Cardiovascular Disease

## 2021-12-21 DIAGNOSIS — E7800 Pure hypercholesterolemia, unspecified: Secondary | ICD-10-CM

## 2021-12-23 DIAGNOSIS — H52223 Regular astigmatism, bilateral: Secondary | ICD-10-CM | POA: Diagnosis not present

## 2021-12-23 DIAGNOSIS — H5213 Myopia, bilateral: Secondary | ICD-10-CM | POA: Diagnosis not present

## 2021-12-23 DIAGNOSIS — H04123 Dry eye syndrome of bilateral lacrimal glands: Secondary | ICD-10-CM | POA: Diagnosis not present

## 2021-12-26 ENCOUNTER — Encounter: Payer: Self-pay | Admitting: Cardiovascular Disease

## 2021-12-26 DIAGNOSIS — E7800 Pure hypercholesterolemia, unspecified: Secondary | ICD-10-CM

## 2021-12-27 DIAGNOSIS — M25521 Pain in right elbow: Secondary | ICD-10-CM | POA: Diagnosis not present

## 2021-12-29 DIAGNOSIS — E7800 Pure hypercholesterolemia, unspecified: Secondary | ICD-10-CM | POA: Diagnosis not present

## 2021-12-29 LAB — LIPID PANEL
Chol/HDL Ratio: 3.9 ratio (ref 0.0–4.4)
Cholesterol, Total: 198 mg/dL (ref 100–199)
HDL: 51 mg/dL (ref >39)
LDL Chol Calc (NIH): 136 mg/dL — ABNORMAL HIGH (ref 0–99)
Triglycerides: 60 mg/dL (ref 0–149)
VLDL Cholesterol Cal: 11 mg/dL (ref 5–40)

## 2022-01-02 DIAGNOSIS — J209 Acute bronchitis, unspecified: Secondary | ICD-10-CM | POA: Diagnosis not present

## 2022-01-04 DIAGNOSIS — J189 Pneumonia, unspecified organism: Secondary | ICD-10-CM | POA: Diagnosis not present

## 2022-01-04 DIAGNOSIS — R051 Acute cough: Secondary | ICD-10-CM | POA: Diagnosis not present

## 2022-01-12 DIAGNOSIS — R5383 Other fatigue: Secondary | ICD-10-CM | POA: Diagnosis not present

## 2022-01-12 DIAGNOSIS — R052 Subacute cough: Secondary | ICD-10-CM | POA: Diagnosis not present

## 2022-01-18 DIAGNOSIS — R052 Subacute cough: Secondary | ICD-10-CM | POA: Diagnosis not present

## 2022-01-18 DIAGNOSIS — J302 Other seasonal allergic rhinitis: Secondary | ICD-10-CM | POA: Diagnosis not present

## 2022-01-18 DIAGNOSIS — R0981 Nasal congestion: Secondary | ICD-10-CM | POA: Diagnosis not present

## 2022-01-20 ENCOUNTER — Ambulatory Visit: Payer: Medicare Other | Admitting: Cardiovascular Disease

## 2022-01-20 ENCOUNTER — Other Ambulatory Visit: Payer: Self-pay | Admitting: *Deleted

## 2022-01-20 DIAGNOSIS — E7800 Pure hypercholesterolemia, unspecified: Secondary | ICD-10-CM

## 2022-01-23 DIAGNOSIS — M25521 Pain in right elbow: Secondary | ICD-10-CM | POA: Diagnosis not present

## 2022-01-24 ENCOUNTER — Encounter: Payer: Self-pay | Admitting: Cardiovascular Disease

## 2022-01-24 DIAGNOSIS — M0589 Other rheumatoid arthritis with rheumatoid factor of multiple sites: Secondary | ICD-10-CM | POA: Diagnosis not present

## 2022-01-24 DIAGNOSIS — Z79899 Other long term (current) drug therapy: Secondary | ICD-10-CM | POA: Diagnosis not present

## 2022-01-30 DIAGNOSIS — M25521 Pain in right elbow: Secondary | ICD-10-CM | POA: Diagnosis not present

## 2022-01-31 ENCOUNTER — Telehealth: Payer: Self-pay | Admitting: Pharmacist

## 2022-01-31 ENCOUNTER — Ambulatory Visit (INDEPENDENT_AMBULATORY_CARE_PROVIDER_SITE_OTHER): Payer: Medicare Other | Admitting: Pharmacist

## 2022-01-31 VITALS — BP 122/74 | HR 65 | Resp 18 | Ht 68.0 in | Wt 131.2 lb

## 2022-01-31 DIAGNOSIS — I700 Atherosclerosis of aorta: Secondary | ICD-10-CM

## 2022-01-31 DIAGNOSIS — M1991 Primary osteoarthritis, unspecified site: Secondary | ICD-10-CM | POA: Insufficient documentation

## 2022-01-31 DIAGNOSIS — R5383 Other fatigue: Secondary | ICD-10-CM | POA: Insufficient documentation

## 2022-01-31 DIAGNOSIS — R209 Unspecified disturbances of skin sensation: Secondary | ICD-10-CM | POA: Insufficient documentation

## 2022-01-31 DIAGNOSIS — M2550 Pain in unspecified joint: Secondary | ICD-10-CM | POA: Insufficient documentation

## 2022-01-31 DIAGNOSIS — E785 Hyperlipidemia, unspecified: Secondary | ICD-10-CM | POA: Diagnosis not present

## 2022-01-31 DIAGNOSIS — G5793 Unspecified mononeuropathy of bilateral lower limbs: Secondary | ICD-10-CM | POA: Insufficient documentation

## 2022-01-31 DIAGNOSIS — G629 Polyneuropathy, unspecified: Secondary | ICD-10-CM | POA: Insufficient documentation

## 2022-01-31 DIAGNOSIS — M310 Hypersensitivity angiitis: Secondary | ICD-10-CM | POA: Insufficient documentation

## 2022-01-31 DIAGNOSIS — G2581 Restless legs syndrome: Secondary | ICD-10-CM | POA: Insufficient documentation

## 2022-01-31 MED ORDER — PRALUENT 75 MG/ML ~~LOC~~ SOAJ: 75.0000 mg | SUBCUTANEOUS | 11 refills | Status: DC

## 2022-01-31 NOTE — Addendum Note (Signed)
Addended by: Allean Found on: 01/31/2022 04:07 PM ? ? Modules accepted: Orders ? ?

## 2022-01-31 NOTE — Progress Notes (Signed)
Patient ID: Patricia Long                 DOB: 25-Feb-1951                    MRN: [id number] ? ? ? ? ?HPI: ?Patricia Long is a 71 y.o. female patient referred to lipid clinic by Dr Sallyanne Kuster. PMH is significant for HTN, aortic atherosclerosis, RA, and statin intoelrance. Patient has trialed and failed atorvastatin and rosuvstatin. Is still taking Zetia. ? ?Patient presents today in good spirits. Reports atorvastatin caused leg pain, and muscle pain in legs and back. She also believes it was affecting her memory. Rosuvastatin caused muscle cramping that made it difficult to get out of bed. ? ?Recently completed treatment for pneumonia.  Is on infliximab and methotrexate for RA. ? ? ?Current Medications:  ?Ezetimibe '10mg'$  ? ?Intolerances:  ?Rosuvastatin ?Atorvastatin ? ?Risk Factors:  ?HTN ?Aortic Atherosclerosis ? ?LDL goal: <70 ? ?Labs: TC 198, Trigs 60, HDL 51, LDL 136 (12/29/21) ? ?Past Medical History:  ?Diagnosis Date  ? Arthritis   ? RA  ? Basal cell carcinoma ~ 2013  ? "legs"  ? Cancer [facility])   ? right breast cancer  ? Cataract   ? Chronic bronchitis ([place])   ? "I've had it several times but not q yr" (01/13/2015)  ? Endometriosis   ? Fibroid   ? Hypertension   ? Irritable bowel syndrome   ? Pneumonia 1970's X 1; ~ 2013  ? PONV (postoperative nausea and vomiting)   ? Rheumatoid arthritis(714.0)   ? ? ?Current Outpatient Medications on File Prior to Visit  ?Medication Sig Dispense Refill  ? amLODipine (NORVASC) 10 MG tablet TAKE 1 TABLET BY MOUTH  DAILY 90 tablet 0  ? azithromycin (ZITHROMAX) 250 MG tablet azithromycin 250 mg tablet ? TAKE 2 TABLETS BY MOUTH TODAY, THEN TAKE 1 TABLET DAILY FOR 4 DAYS    ? benzonatate (TESSALON) 200 MG capsule Take 200 mg by mouth 3 (three) times daily.    ? cetirizine (ZYRTEC) 10 MG tablet Take 10 mg by mouth daily as needed (remicaide infusion).    ? Coenzyme Q10 200 MG capsule Take 1 capsule (200 mg total) by mouth daily. 90 capsule 3  ? ezetimibe (ZETIA) 10 MG tablet     ?  fexofenadine (ALLEGRA) 180 MG tablet Take 180 mg by mouth daily as needed.    ? fluorometholone (FML) 0.1 % ophthalmic suspension fluorometholone 0.1 % eye drops,suspension ? 1 DROP INTO BOTH EYES 4 TIMES A DAY FOR 10 DAYS AS DIRECTED    ? folic acid (FOLVITE) 1 MG tablet Take 1 mg by mouth daily.    ? gabapentin (NEURONTIN) 300 MG capsule Take 1 capsule (300 mg total) by mouth at bedtime. 90 capsule 4  ? hydroxychloroquine (PLAQUENIL) 200 MG tablet Take 200 mg by mouth daily.    ? inFLIXimab (REMICADE) 100 MG injection Inject 100 mg into the vein every 8 (eight) weeks.    ? ipratropium (ATROVENT) 0.06 % nasal spray Place 2 sprays into both nostrils 4 (four) times daily.    ? levofloxacin (LEVAQUIN) 500 MG tablet levofloxacin 500 mg tablet ? TAKE 1 TABLET BY MOUTH EVERY DAY FOR 10 DAYS    ? meloxicam (MOBIC) 15 MG tablet Take 1 tablet by mouth daily as needed.    ? Methotrexate, Anti-Rheumatic, (METHOTREXATE, PF, [place]) Inject 20 mg into the skin once a week.    ? Multiple Vitamin (MULTIVITAMIN)  tablet Take 1 tablet by mouth daily.    ? pramipexole (MIRAPEX) 0.125 MG tablet     ? predniSONE (DELTASONE) 10 MG tablet prednisone 10 mg tablet ? TAKE AS DIRECTED 12 DAY TAPER: 6-6-5-5-4-4-3-3-2-2-1-1    ? traZODone (DESYREL) 50 MG tablet as directed.    ? valsartan (DIOVAN) 80 MG tablet Take 80 mg by mouth daily.    ? ?No current facility-administered medications on file prior to visit.  ? ? ?Allergies  ?Allergen Reactions  ? Prochlorperazine Edisylate Other (See Comments)  ?  Compazine, "muscular" from base of spine to skull jerked forward, major spasma  ? Prochlorperazine Edisylate   ?  Other reaction(s): Other (See Comments) ?compazine  ? Codeine Nausea Only and Rash  ? Prochlorperazine Rash  ? Tape Rash  ?  Plastic tape, not paper tape  ? ? ?Assessment/Plan: ? ?1. Hyperlipidemia - Patient's current LDL 136 which is above goal of <70. Unfortunately unable to tolerate statins. Since she is currently on Zetia, recommend  starting PCSK9i.  Using demo pen, educated patient on mechanism of action, storage, site selection, administration, and possible adverse effects. Patient was able to demonstrate in room. Will complete PA and contact patient when approved.  Will recheck cholesterol panel in 2-3 months.  Will continue Zetia '10mg'$  daily until updated lab results come back. ? ?Start repatha/praluent sq q 14 days ?Continue Zetia '10mg'$  daily ?Recheck lipid panel in 2-3 months ?  ?

## 2022-01-31 NOTE — Telephone Encounter (Signed)
Called and spoke to pt and stated that they were approved for praluent '75mg'$  q2w, rx sent and instructed the pt to call if cost prohibitive and to complete fasting labs post 4th dose. Pt voiced understanding ?

## 2022-01-31 NOTE — Telephone Encounter (Signed)
Patricia Long (KeyMindi Junker) - [id number] ?Praluent '75MG'$ /ML auto-injectors ?Status: PA Request ?Created: May 9th, 2023 ?Sent: May 9th, 2023 ? ?LIPID PANEL ORDERED AND RELEASED ?

## 2022-01-31 NOTE — Patient Instructions (Addendum)
It was nice meeting you today ? ?We would like to start you on a new medication called Repatha or Praluent which you will inject once every 2 weeks ? ?Continue your ezetimibe '10mg'$  daily for now ? ?We will complete the prior authorization for you and contact you when it is approved ? ?Once you start the medication we will recheck your cholesterol in 2-3 months ? ?Please call with any questions! ? ?Karren Cobble, PharmD, BCACP, [place], CPP ?[address], [address] ?[place], [place], [address] ?Phone: [phone], Fax: [phone]  ?

## 2022-01-31 NOTE — Telephone Encounter (Signed)
Please complete prior authorization for: ? ?Name of medication, dose, and frequency Repatha '140mg'$  sq q 14 days or Praluent '75mg'$  sq q 14 days ? ?Lab Orders Requested? yes ? ?Which labs? Lipid panel ? ?Estimated date for labs to be scheduled 2-3 months ? ?Does patient need activated copay card? no  ?

## 2022-02-02 DIAGNOSIS — Z20822 Contact with and (suspected) exposure to covid-19: Secondary | ICD-10-CM | POA: Diagnosis not present

## 2022-02-06 DIAGNOSIS — M25521 Pain in right elbow: Secondary | ICD-10-CM | POA: Diagnosis not present

## 2022-02-07 ENCOUNTER — Other Ambulatory Visit: Payer: Self-pay

## 2022-02-07 MED ORDER — PRALUENT 75 MG/ML ~~LOC~~ SOAJ: 75.0000 mg | SUBCUTANEOUS | 11 refills | Status: DC

## 2022-02-13 DIAGNOSIS — M25521 Pain in right elbow: Secondary | ICD-10-CM | POA: Diagnosis not present

## 2022-02-14 DIAGNOSIS — B351 Tinea unguium: Secondary | ICD-10-CM | POA: Diagnosis not present

## 2022-02-14 DIAGNOSIS — B078 Other viral warts: Secondary | ICD-10-CM | POA: Diagnosis not present

## 2022-02-14 DIAGNOSIS — L820 Inflamed seborrheic keratosis: Secondary | ICD-10-CM | POA: Diagnosis not present

## 2022-02-14 DIAGNOSIS — L570 Actinic keratosis: Secondary | ICD-10-CM | POA: Diagnosis not present

## 2022-02-14 DIAGNOSIS — X32XXXD Exposure to sunlight, subsequent encounter: Secondary | ICD-10-CM | POA: Diagnosis not present

## 2022-02-14 DIAGNOSIS — D0471 Carcinoma in situ of skin of right lower limb, including hip: Secondary | ICD-10-CM | POA: Diagnosis not present

## 2022-02-27 DIAGNOSIS — M25521 Pain in right elbow: Secondary | ICD-10-CM | POA: Diagnosis not present

## 2022-02-28 DIAGNOSIS — M0589 Other rheumatoid arthritis with rheumatoid factor of multiple sites: Secondary | ICD-10-CM | POA: Diagnosis not present

## 2022-03-07 DIAGNOSIS — I700 Atherosclerosis of aorta: Secondary | ICD-10-CM | POA: Diagnosis not present

## 2022-03-07 DIAGNOSIS — C50911 Malignant neoplasm of unspecified site of right female breast: Secondary | ICD-10-CM | POA: Diagnosis not present

## 2022-03-07 DIAGNOSIS — E785 Hyperlipidemia, unspecified: Secondary | ICD-10-CM | POA: Diagnosis not present

## 2022-03-07 DIAGNOSIS — M8580 Other specified disorders of bone density and structure, unspecified site: Secondary | ICD-10-CM | POA: Diagnosis not present

## 2022-03-07 DIAGNOSIS — I10 Essential (primary) hypertension: Secondary | ICD-10-CM | POA: Diagnosis not present

## 2022-03-07 DIAGNOSIS — K219 Gastro-esophageal reflux disease without esophagitis: Secondary | ICD-10-CM | POA: Diagnosis not present

## 2022-03-07 DIAGNOSIS — Z1331 Encounter for screening for depression: Secondary | ICD-10-CM | POA: Diagnosis not present

## 2022-03-07 DIAGNOSIS — M0540 Rheumatoid myopathy with rheumatoid arthritis of unspecified site: Secondary | ICD-10-CM | POA: Diagnosis not present

## 2022-03-07 DIAGNOSIS — Z0001 Encounter for general adult medical examination with abnormal findings: Secondary | ICD-10-CM | POA: Diagnosis not present

## 2022-03-07 DIAGNOSIS — M25473 Effusion, unspecified ankle: Secondary | ICD-10-CM | POA: Diagnosis not present

## 2022-03-07 DIAGNOSIS — Z0000 Encounter for general adult medical examination without abnormal findings: Secondary | ICD-10-CM | POA: Diagnosis not present

## 2022-03-07 DIAGNOSIS — G629 Polyneuropathy, unspecified: Secondary | ICD-10-CM | POA: Diagnosis not present

## 2022-03-13 DIAGNOSIS — M25621 Stiffness of right elbow, not elsewhere classified: Secondary | ICD-10-CM | POA: Diagnosis not present

## 2022-04-03 DIAGNOSIS — M25621 Stiffness of right elbow, not elsewhere classified: Secondary | ICD-10-CM | POA: Diagnosis not present

## 2022-04-04 DIAGNOSIS — M0589 Other rheumatoid arthritis with rheumatoid factor of multiple sites: Secondary | ICD-10-CM | POA: Diagnosis not present

## 2022-04-04 DIAGNOSIS — Z79899 Other long term (current) drug therapy: Secondary | ICD-10-CM | POA: Diagnosis not present

## 2022-04-10 DIAGNOSIS — Z9011 Acquired absence of right breast and nipple: Secondary | ICD-10-CM | POA: Diagnosis not present

## 2022-04-10 DIAGNOSIS — Z853 Personal history of malignant neoplasm of breast: Secondary | ICD-10-CM | POA: Diagnosis not present

## 2022-04-10 DIAGNOSIS — Z08 Encounter for follow-up examination after completed treatment for malignant neoplasm: Secondary | ICD-10-CM | POA: Diagnosis not present

## 2022-04-18 DIAGNOSIS — Z85828 Personal history of other malignant neoplasm of skin: Secondary | ICD-10-CM | POA: Diagnosis not present

## 2022-04-18 DIAGNOSIS — B0089 Other herpesviral infection: Secondary | ICD-10-CM | POA: Diagnosis not present

## 2022-04-18 DIAGNOSIS — Z08 Encounter for follow-up examination after completed treatment for malignant neoplasm: Secondary | ICD-10-CM | POA: Diagnosis not present

## 2022-04-24 DIAGNOSIS — Z853 Personal history of malignant neoplasm of breast: Secondary | ICD-10-CM | POA: Diagnosis not present

## 2022-04-24 DIAGNOSIS — Z9011 Acquired absence of right breast and nipple: Secondary | ICD-10-CM | POA: Diagnosis not present

## 2022-04-24 DIAGNOSIS — Z800 Family history of malignant neoplasm of digestive organs: Secondary | ICD-10-CM | POA: Diagnosis not present

## 2022-04-24 DIAGNOSIS — Z08 Encounter for follow-up examination after completed treatment for malignant neoplasm: Secondary | ICD-10-CM | POA: Diagnosis not present

## 2022-04-24 DIAGNOSIS — Z1231 Encounter for screening mammogram for malignant neoplasm of breast: Secondary | ICD-10-CM | POA: Diagnosis not present

## 2022-05-01 DIAGNOSIS — M1991 Primary osteoarthritis, unspecified site: Secondary | ICD-10-CM | POA: Diagnosis not present

## 2022-05-01 DIAGNOSIS — Z79899 Other long term (current) drug therapy: Secondary | ICD-10-CM | POA: Diagnosis not present

## 2022-05-01 DIAGNOSIS — Z6820 Body mass index (BMI) 20.0-20.9, adult: Secondary | ICD-10-CM | POA: Diagnosis not present

## 2022-05-01 DIAGNOSIS — M0589 Other rheumatoid arthritis with rheumatoid factor of multiple sites: Secondary | ICD-10-CM | POA: Diagnosis not present

## 2022-05-02 DIAGNOSIS — M0589 Other rheumatoid arthritis with rheumatoid factor of multiple sites: Secondary | ICD-10-CM | POA: Diagnosis not present

## 2022-05-10 DIAGNOSIS — K219 Gastro-esophageal reflux disease without esophagitis: Secondary | ICD-10-CM | POA: Diagnosis not present

## 2022-05-10 DIAGNOSIS — I10 Essential (primary) hypertension: Secondary | ICD-10-CM | POA: Diagnosis not present

## 2022-05-10 DIAGNOSIS — E785 Hyperlipidemia, unspecified: Secondary | ICD-10-CM | POA: Diagnosis not present

## 2022-05-16 DIAGNOSIS — Z08 Encounter for follow-up examination after completed treatment for malignant neoplasm: Secondary | ICD-10-CM | POA: Diagnosis not present

## 2022-05-16 DIAGNOSIS — Z85828 Personal history of other malignant neoplasm of skin: Secondary | ICD-10-CM | POA: Diagnosis not present

## 2022-05-16 DIAGNOSIS — L821 Other seborrheic keratosis: Secondary | ICD-10-CM | POA: Diagnosis not present

## 2022-05-16 DIAGNOSIS — B078 Other viral warts: Secondary | ICD-10-CM | POA: Diagnosis not present

## 2022-05-16 DIAGNOSIS — L308 Other specified dermatitis: Secondary | ICD-10-CM | POA: Diagnosis not present

## 2022-05-16 DIAGNOSIS — L820 Inflamed seborrheic keratosis: Secondary | ICD-10-CM | POA: Diagnosis not present

## 2022-06-12 DIAGNOSIS — M25531 Pain in right wrist: Secondary | ICD-10-CM | POA: Diagnosis not present

## 2022-06-13 DIAGNOSIS — M0589 Other rheumatoid arthritis with rheumatoid factor of multiple sites: Secondary | ICD-10-CM | POA: Diagnosis not present

## 2022-06-20 DIAGNOSIS — X32XXXD Exposure to sunlight, subsequent encounter: Secondary | ICD-10-CM | POA: Diagnosis not present

## 2022-06-20 DIAGNOSIS — L570 Actinic keratosis: Secondary | ICD-10-CM | POA: Diagnosis not present

## 2022-06-20 DIAGNOSIS — Z1283 Encounter for screening for malignant neoplasm of skin: Secondary | ICD-10-CM | POA: Diagnosis not present

## 2022-06-20 DIAGNOSIS — D225 Melanocytic nevi of trunk: Secondary | ICD-10-CM | POA: Diagnosis not present

## 2022-06-20 DIAGNOSIS — L820 Inflamed seborrheic keratosis: Secondary | ICD-10-CM | POA: Diagnosis not present

## 2022-06-28 DIAGNOSIS — M79642 Pain in left hand: Secondary | ICD-10-CM | POA: Diagnosis not present

## 2022-06-28 DIAGNOSIS — M25531 Pain in right wrist: Secondary | ICD-10-CM | POA: Diagnosis not present

## 2022-06-28 DIAGNOSIS — M13841 Other specified arthritis, right hand: Secondary | ICD-10-CM | POA: Diagnosis not present

## 2022-06-28 DIAGNOSIS — M79641 Pain in right hand: Secondary | ICD-10-CM | POA: Diagnosis not present

## 2022-06-28 DIAGNOSIS — M65831 Other synovitis and tenosynovitis, right forearm: Secondary | ICD-10-CM | POA: Diagnosis not present

## 2022-06-28 DIAGNOSIS — M06831 Other specified rheumatoid arthritis, right wrist: Secondary | ICD-10-CM | POA: Diagnosis not present

## 2022-06-28 DIAGNOSIS — M21949 Unspecified acquired deformity of hand, unspecified hand: Secondary | ICD-10-CM | POA: Diagnosis not present

## 2022-07-04 DIAGNOSIS — G629 Polyneuropathy, unspecified: Secondary | ICD-10-CM | POA: Diagnosis not present

## 2022-07-04 DIAGNOSIS — D539 Nutritional anemia, unspecified: Secondary | ICD-10-CM | POA: Diagnosis not present

## 2022-07-04 DIAGNOSIS — R799 Abnormal finding of blood chemistry, unspecified: Secondary | ICD-10-CM | POA: Diagnosis not present

## 2022-07-25 DIAGNOSIS — Z79899 Other long term (current) drug therapy: Secondary | ICD-10-CM | POA: Diagnosis not present

## 2022-07-25 DIAGNOSIS — M0589 Other rheumatoid arthritis with rheumatoid factor of multiple sites: Secondary | ICD-10-CM | POA: Diagnosis not present

## 2022-07-31 DIAGNOSIS — M25531 Pain in right wrist: Secondary | ICD-10-CM | POA: Diagnosis not present

## 2022-07-31 DIAGNOSIS — M06839 Other specified rheumatoid arthritis, unspecified wrist: Secondary | ICD-10-CM | POA: Diagnosis not present

## 2022-07-31 DIAGNOSIS — M79642 Pain in left hand: Secondary | ICD-10-CM | POA: Diagnosis not present

## 2022-07-31 DIAGNOSIS — M79641 Pain in right hand: Secondary | ICD-10-CM | POA: Diagnosis not present

## 2022-07-31 DIAGNOSIS — R52 Pain, unspecified: Secondary | ICD-10-CM | POA: Diagnosis not present

## 2022-07-31 DIAGNOSIS — M65839 Other synovitis and tenosynovitis, unspecified forearm: Secondary | ICD-10-CM | POA: Diagnosis not present

## 2022-07-31 DIAGNOSIS — M13841 Other specified arthritis, right hand: Secondary | ICD-10-CM | POA: Diagnosis not present

## 2022-07-31 DIAGNOSIS — M21949 Unspecified acquired deformity of hand, unspecified hand: Secondary | ICD-10-CM | POA: Diagnosis not present

## 2022-07-31 DIAGNOSIS — M659 Synovitis and tenosynovitis, unspecified: Secondary | ICD-10-CM | POA: Diagnosis not present

## 2022-08-09 DIAGNOSIS — H5212 Myopia, left eye: Secondary | ICD-10-CM | POA: Diagnosis not present

## 2022-08-09 DIAGNOSIS — H4010X1 Unspecified open-angle glaucoma, mild stage: Secondary | ICD-10-CM | POA: Diagnosis not present

## 2022-08-09 DIAGNOSIS — H401131 Primary open-angle glaucoma, bilateral, mild stage: Secondary | ICD-10-CM | POA: Diagnosis not present

## 2022-08-09 DIAGNOSIS — H52223 Regular astigmatism, bilateral: Secondary | ICD-10-CM | POA: Diagnosis not present

## 2022-08-09 DIAGNOSIS — H524 Presbyopia: Secondary | ICD-10-CM | POA: Diagnosis not present

## 2022-08-16 DIAGNOSIS — H5212 Myopia, left eye: Secondary | ICD-10-CM | POA: Diagnosis not present

## 2022-08-16 DIAGNOSIS — H52223 Regular astigmatism, bilateral: Secondary | ICD-10-CM | POA: Diagnosis not present

## 2022-08-16 DIAGNOSIS — H524 Presbyopia: Secondary | ICD-10-CM | POA: Diagnosis not present

## 2022-08-16 DIAGNOSIS — Z79899 Other long term (current) drug therapy: Secondary | ICD-10-CM | POA: Diagnosis not present

## 2022-08-21 DIAGNOSIS — M0589 Other rheumatoid arthritis with rheumatoid factor of multiple sites: Secondary | ICD-10-CM | POA: Diagnosis not present

## 2022-08-21 DIAGNOSIS — D849 Immunodeficiency, unspecified: Secondary | ICD-10-CM | POA: Diagnosis not present

## 2022-08-21 DIAGNOSIS — J40 Bronchitis, not specified as acute or chronic: Secondary | ICD-10-CM | POA: Diagnosis not present

## 2022-08-30 ENCOUNTER — Ambulatory Visit
Admission: RE | Admit: 2022-08-30 | Discharge: 2022-08-30 | Disposition: A | Discharge status: Home / Self Care | Payer: Medicare Other | Source: Ambulatory Visit | Attending: Physician Assistant | Admitting: Physician Assistant

## 2022-08-30 ENCOUNTER — Other Ambulatory Visit: Payer: Self-pay | Admitting: Physician Assistant

## 2022-08-30 DIAGNOSIS — R051 Acute cough: Secondary | ICD-10-CM | POA: Diagnosis not present

## 2022-08-30 DIAGNOSIS — R058 Other specified cough: Secondary | ICD-10-CM

## 2022-08-30 DIAGNOSIS — R062 Wheezing: Secondary | ICD-10-CM | POA: Diagnosis not present

## 2022-08-30 DIAGNOSIS — R059 Cough, unspecified: Secondary | ICD-10-CM | POA: Diagnosis not present

## 2022-09-05 DIAGNOSIS — M0589 Other rheumatoid arthritis with rheumatoid factor of multiple sites: Secondary | ICD-10-CM | POA: Diagnosis not present

## 2022-09-08 DIAGNOSIS — J989 Respiratory disorder, unspecified: Secondary | ICD-10-CM | POA: Diagnosis not present

## 2022-09-08 DIAGNOSIS — R051 Acute cough: Secondary | ICD-10-CM | POA: Diagnosis not present

## 2022-09-11 DIAGNOSIS — H524 Presbyopia: Secondary | ICD-10-CM | POA: Diagnosis not present

## 2022-09-11 DIAGNOSIS — H401131 Primary open-angle glaucoma, bilateral, mild stage: Secondary | ICD-10-CM | POA: Diagnosis not present

## 2022-09-11 DIAGNOSIS — H5212 Myopia, left eye: Secondary | ICD-10-CM | POA: Diagnosis not present

## 2022-09-11 DIAGNOSIS — H52223 Regular astigmatism, bilateral: Secondary | ICD-10-CM | POA: Diagnosis not present

## 2022-09-12 DIAGNOSIS — G629 Polyneuropathy, unspecified: Secondary | ICD-10-CM | POA: Diagnosis not present

## 2022-09-12 DIAGNOSIS — G6289 Other specified polyneuropathies: Secondary | ICD-10-CM | POA: Diagnosis not present

## 2022-09-12 DIAGNOSIS — E678 Other specified hyperalimentation: Secondary | ICD-10-CM | POA: Diagnosis not present

## 2022-09-28 DIAGNOSIS — D72828 Other elevated white blood cell count: Secondary | ICD-10-CM | POA: Diagnosis not present

## 2022-09-28 DIAGNOSIS — R635 Abnormal weight gain: Secondary | ICD-10-CM | POA: Diagnosis not present

## 2022-09-28 DIAGNOSIS — R062 Wheezing: Secondary | ICD-10-CM | POA: Diagnosis not present

## 2022-09-28 DIAGNOSIS — R053 Chronic cough: Secondary | ICD-10-CM | POA: Diagnosis not present

## 2022-10-17 DIAGNOSIS — Z79899 Other long term (current) drug therapy: Secondary | ICD-10-CM | POA: Diagnosis not present

## 2022-10-17 DIAGNOSIS — R5383 Other fatigue: Secondary | ICD-10-CM | POA: Diagnosis not present

## 2022-10-17 DIAGNOSIS — M0589 Other rheumatoid arthritis with rheumatoid factor of multiple sites: Secondary | ICD-10-CM | POA: Diagnosis not present

## 2022-10-18 NOTE — Progress Notes (Signed)
Synopsis: Referred for cough following URI by Johna Roles, PA  Subjective:   PATIENT ID: Patricia Long GENDER: female DOB: 1951/03/18, MRN: [id number]  Chief Complaint  Patient presents with   Consult   89yF with history of RA on remicade/MTX (has been on for years)/plaquenil sees Dr. Trudie Reed, R breast CA had mastectomy no chemoXRT, recurrent bronchitis, endometriosis, GERD just watches diet, esophagitis, never smoker referred for cough  2 years ago had covid-19 with prolonged recovery but wasn't hospitalized.   Last April had pneumonia and took a long time to recover  She says she had bronchitis back in 07/2022 and since then has had cough. Usually dry cough. No DOE but hasn't really challenged herself recently. Intermittent hoarseness as well. No recent changes to her RA medications. Disease activity is pretty good right now wrt her RA. Doesn't feel like she is flaring. She has no sinus congestion, only occasional postnasal drainage. 30 day course of ppi failed to make much of a difference.   Otherwise pertinent review of systems is negative.  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 Has lived in [place], [place] in addition to [place]. No recent travel before this last bout of cough.   Past Medical History:  Diagnosis Date   Arthritis    RA   Basal cell carcinoma ~ 2013   "legs"   Cancer ([place])    right breast cancer   Cataract    Chronic bronchitis ([place])    "I've had it several times but not q yr" (01/13/2015)   Endometriosis    Fibroid    Hypertension    Irritable bowel syndrome    Pneumonia 1970's X 1; ~ 2013   PONV (postoperative nausea and vomiting)    Rheumatoid arthritis(714.0)      Family History  Problem Relation Age of Onset   Heart disease Mother    Heart attack Mother    Heart disease Father    Heart attack Father    Colon cancer Maternal Aunt    Colon cancer  Paternal Uncle    Breast cancer Maternal Aunt 79   Colon polyps Neg Hx    Diabetes Neg Hx    Kidney disease Neg Hx    Esophageal cancer Neg Hx    Stomach cancer Neg Hx    Rectal cancer Neg Hx      Past Surgical History:  Procedure Laterality Date   BASAL CELL CARCINOMA EXCISION  ~ 2013   "legs"   BREAST CYST EXCISION Bilateral 1992   NODULE EXCISION OF RIGHT AND LEFT BREAST   BREAST SURGERY     and reconstruction 2020   COLONOSCOPY     FOOT SURGERY Bilateral ~ 2006-2008   "reconstruction"   JOINT REPLACEMENT Left 2009   WRIST AND FINGER    KNEE ARTHROSCOPY Right 1980's   PELVIC LAPAROSCOPY  '82 AND '87   TOTAL ABDOMINAL HYSTERECTOMY  1997   TAH/BSO    Social History   Socioeconomic History   Marital status: Widowed    Spouse name: Not on file   Number of children: 0   Years of education: Not on file   Highest education level: Not on file  Occupational History   Occupation: Retired  Tobacco Use   Smoking status: Never   Smokeless tobacco: Never  Vaping Use   Vaping Use: Never used  Substance and Sexual Activity   Alcohol use:  Yes    Alcohol/week: 1.0 standard drink of alcohol    Types: 1 Glasses of wine per week   Drug use: No   Sexual activity: Not Currently    Birth control/protection: Surgical  Other Topics Concern   Not on file  Social History Narrative   Lives with husband in a 2 story home.  Has no children.  Retired from Press photographer.  Education: some Counsellor.   Social Determinants of Radio broadcast assistant Strain: Not on file  Food Insecurity: Not on file  Transportation Needs: Not on file  Physical Activity: Not on file  Stress: Not on file  Social Connections: Not on file  Intimate Partner Violence: Not on file     Allergies  Allergen Reactions   Codeine Nausea Only and Rash    Other reaction(s): rash   Prochlorperazine Rash    Other reaction(s): Seizure, Unknown   Crestor [Rosuvastatin]     myalgias   Etanercept     Other  reaction(s): lost efficacy   Lipitor [Atorvastatin]     Myalgias, brain fog   Prochlorperazine Edisylate Other (See Comments)    Compazine, "muscular" from base of spine to skull jerked forward, major spasma   Prochlorperazine Edisylate     Other reaction(s): Other (See Comments) compazine   Tape Rash    Plastic tape, not paper tape     Outpatient Medications Prior to Visit  Medication Sig Dispense Refill   Alirocumab (PRALUENT) 75 MG/ML SOAJ Inject 75 mg into the skin every 14 (fourteen) days. 2 mL 11   amLODipine (NORVASC) 5 MG tablet Take 5 mg by mouth daily.     cetirizine (ZYRTEC) 10 MG tablet Take 10 mg by mouth daily as needed (remicaide infusion).     Coenzyme Q10 200 MG capsule Take 1 capsule (200 mg total) by mouth daily. 90 capsule 3   ezetimibe (ZETIA) 10 MG tablet      fexofenadine (ALLEGRA) 180 MG tablet Take 180 mg by mouth daily as needed.     folic acid (FOLVITE) 1 MG tablet Take 1 mg by mouth daily.     gabapentin (NEURONTIN) 300 MG capsule Take 1 capsule (300 mg total) by mouth at bedtime. 90 capsule 4   hydroxychloroquine (PLAQUENIL) 200 MG tablet Take 200 mg by mouth daily.     inFLIXimab (REMICADE) 100 MG injection Inject 100 mg into the vein every 8 (eight) weeks.     meloxicam (MOBIC) 15 MG tablet Take 1 tablet by mouth daily as needed.     Methotrexate, Anti-Rheumatic, (METHOTREXATE, PF, [place]) Inject 20 mg into the skin once a week.     Multiple Vitamin (MULTIVITAMIN) tablet Take 1 tablet by mouth daily.     pramipexole (MIRAPEX) 0.125 MG tablet      traZODone (DESYREL) 50 MG tablet as directed.     valsartan (DIOVAN) 80 MG tablet Take 80 mg by mouth daily.     No facility-administered medications prior to visit.       Objective:   Physical Exam:  General appearance: 72 y.o., female, NAD, conversant  Eyes: anicteric sclerae; PERRL, tracking appropriately HENT: NCAT; MMM Neck: Trachea midline; no lymphadenopathy, no JVD Lungs: CTAB, no crackles, no  wheeze, with normal respiratory effort CV: RRR, no murmur  Abdomen: Soft, non-tender; non-distended, BS present  Extremities: No peripheral edema, warm Skin: Normal turgor and texture; no rash Psych: Appropriate affect Neuro: Alert and oriented to person and place, no focal deficit  Vitals:   10/20/22 1054  BP: 126/78  Pulse: (!) 57  Temp: 98.1 F (36.7 C)  TempSrc: Oral  SpO2: 99%  Weight: 134 lb (60.8 kg)  Height: '5\' 8"'$  (1.727 m)   99% on RA BMI Readings from Last 3 Encounters:  10/20/22 20.37 kg/m  01/31/22 19.95 kg/m  10/27/21 20.83 kg/m   Wt Readings from Last 3 Encounters:  10/20/22 134 lb (60.8 kg)  01/31/22 131 lb 3.2 oz (59.5 kg)  10/27/21 137 lb (62.1 kg)     CBC    Component Value Date/Time   WBC 5.3 01/01/2020 1600   RBC 4.43 01/01/2020 1600   HGB 15.0 01/01/2020 1622   HGB 13.0 03/05/2019 0837   HCT 44.0 01/01/2020 1622   PLT 156 01/01/2020 1600   PLT 258 03/05/2019 0837   MCV 98.0 01/01/2020 1600   MCH 32.7 01/01/2020 1600   MCHC 33.4 01/01/2020 1600   RDW 14.9 01/01/2020 1600   LYMPHSABS 0.7 01/01/2020 1600   MONOABS 0.8 01/01/2020 1600   EOSABS 0.0 01/01/2020 1600   BASOSABS 0.0 01/01/2020 1600     Chest Imaging: CXR 08/30/22 reviewed by me unremarkable  CT cardiac 12/21/21 lung bases reviewed by me unremarkable  Pulmonary Functions Testing Results:     No data to display            Echocardiogram 2016:   - Left ventricle: The cavity size was normal. Systolic function was    normal. The estimated ejection fraction was in the range of 55%    to 60%. Wall motion was normal; there were no regional wall    motion abnormalities.  - Atrial septum: No defect or patent foramen ovale was identified.      Assessment & Plan:   # Chronic cough Unclear etiology - irritable larynx/LPR possible. Recent CT cardiac without evidence of ILD either related to RA or MTX exposure though it was obtained before cough became more  consistent. With hoarseness another consideration would be cricoarytenoid RA involvement.   Plan: - start omeprazole 40 mg every day 30 minutes before dinner everyday till next visit - HRCT Chest  - we will schedule PFTs on same day as next visit in 8 weeks - look for dextromethorphan over the counter for cough suppressant  - discussed lifestyle measures for chronic throat clearing and reflux - if no better, diagnosis unclear at next visit consideration of ENT referral   RTC 8 weeks    Maryjane Hurter, MD [facility] 10/20/2022 11:23 AM

## 2022-10-20 ENCOUNTER — Ambulatory Visit (INDEPENDENT_AMBULATORY_CARE_PROVIDER_SITE_OTHER): Payer: Medicare Other | Admitting: Student

## 2022-10-20 ENCOUNTER — Encounter: Payer: Self-pay | Admitting: Student

## 2022-10-20 VITALS — BP 126/78 | HR 57 | Temp 98.1°F | Ht 68.0 in | Wt 134.0 lb

## 2022-10-20 DIAGNOSIS — R053 Chronic cough: Secondary | ICD-10-CM

## 2022-10-20 DIAGNOSIS — R062 Wheezing: Secondary | ICD-10-CM | POA: Diagnosis not present

## 2022-10-20 DIAGNOSIS — R635 Abnormal weight gain: Secondary | ICD-10-CM | POA: Diagnosis not present

## 2022-10-20 MED ORDER — OMEPRAZOLE 40 MG PO CPDR: 40.0000 mg | DELAYED_RELEASE_CAPSULE | Freq: Every day | ORAL | 0 refills | Status: DC

## 2022-10-20 NOTE — Patient Instructions (Addendum)
- start omeprazole 40 mg every day 30 minutes before dinner everyday till next visit - CT chest you'll be called to schedule - we will schedule PFTs on same day as next visit in 8 weeks - look for dextromethorphan over the counter for cough suppressant  - see instructions below for chronic throat clearing and reflux  Throat Clearing instructions:  You may be a chronic throat clearer! You are not alone! The causes of chronic throat clearing include acid reflux (laryngopharyngeal reflux), allergies, environmental irritants such as tobacco smoke and air pollution, and asthma. If present for a long time throat clearing can become habit forming. When you clear your throat, you are transferring mucus from your throat up into your mouth and nose. We all secrete up to 2 liters (imagine a big Coke bottle) of mucus a day. This saliva is usually swallowed and ends up in the toilet eventually. By clearing the mucus back into your mouth and nose you are sending the saliva in the wrong direction. This is counterproductive. Unless you are walking around spitting all day (which most throat clearers do not do), the mucus will work its way back down to the throat and eventually be swallowed. Get the mucus going in the right direction. Swallow! Swallow! Swallow! No throat clearing.  Chronic throat clearing is damaging. The trauma from the throat clearing can cause redness and swelling of your vocal cords. If the clearing is very excessive small growths (granulomas) can form. These granulomas can get so large that they can eventually affect your breathing. Surgical removal may be necessary. The irritation and swelling produced by the clearing can cause saliva to sit in your throat. This causes more throat clearing. More throat clearing causes more stagnant mucus which causes more throat clearing, which causes more mucus, etc. A vicious cycle will ensue and the habit can be very difficult to break. Without your help and a  conscious effort on your part to break the cycle, the throat clearing will never stop.  Your doctor may prescribe medication and behavioral modifications to treat acid reflux disease. Nose and throat sprays may be prescribed to treat underlying allergies or asthma. Avoiding possible irritants will be recommended. Without changes to your behavior these treatments will not be successful. The following alterations are recommended:  Do not clear your throat. Swallow instead. This gets the mucus going in the right direction towards the toilet.  Carry around some water to assist with swallowing and mucus clearance. When you feel the urge to clear your throat take a sip of the water. If you absolutely need to clear your throat perform a non-traumatic throat clear. To do this pant with your mouth open and say "[place], [place], [place]" with a powerful but very breathy voice. This will clear the secretions without causing damage. Increase your water intake. This will thin secretions and make it easier to swallow. Comply with the behavior recommendations for reflux disease. Chew baking soda gum. This can be found on [facility]) or in the tooth paste isle of your pharmacy. Gum chewing can help with swallowing, reflux, and throat clearing. Chew three pieces a day. If you develop jaw discomfort or headaches decrease the amount of gum chewing. Tell your friends and family to tell you to swallow when you clear your throat. Some people have been clearing so long that they don't even know when they are doing it. Be patient. The urge to clear your throat will not go away overnight. It may take 8  or 12 weeks for the medication and behavior modifications to work.

## 2022-10-30 DIAGNOSIS — R053 Chronic cough: Secondary | ICD-10-CM | POA: Diagnosis not present

## 2022-10-30 DIAGNOSIS — M792 Neuralgia and neuritis, unspecified: Secondary | ICD-10-CM | POA: Diagnosis not present

## 2022-10-30 DIAGNOSIS — Z79899 Other long term (current) drug therapy: Secondary | ICD-10-CM | POA: Diagnosis not present

## 2022-10-30 DIAGNOSIS — Z6820 Body mass index (BMI) 20.0-20.9, adult: Secondary | ICD-10-CM | POA: Diagnosis not present

## 2022-10-30 DIAGNOSIS — M0589 Other rheumatoid arthritis with rheumatoid factor of multiple sites: Secondary | ICD-10-CM | POA: Diagnosis not present

## 2022-10-30 DIAGNOSIS — M1991 Primary osteoarthritis, unspecified site: Secondary | ICD-10-CM | POA: Diagnosis not present

## 2022-10-31 ENCOUNTER — Ambulatory Visit
Admission: RE | Admit: 2022-10-31 | Discharge: 2022-10-31 | Disposition: A | Discharge status: Home / Self Care | Payer: Medicare Other | Source: Ambulatory Visit | Attending: Student | Admitting: Student

## 2022-10-31 DIAGNOSIS — C50911 Malignant neoplasm of unspecified site of right female breast: Secondary | ICD-10-CM | POA: Diagnosis not present

## 2022-10-31 DIAGNOSIS — R053 Chronic cough: Secondary | ICD-10-CM

## 2022-10-31 DIAGNOSIS — I700 Atherosclerosis of aorta: Secondary | ICD-10-CM | POA: Diagnosis not present

## 2022-10-31 DIAGNOSIS — R059 Cough, unspecified: Secondary | ICD-10-CM | POA: Diagnosis not present

## 2022-10-31 DIAGNOSIS — R918 Other nonspecific abnormal finding of lung field: Secondary | ICD-10-CM | POA: Diagnosis not present

## 2022-11-01 ENCOUNTER — Encounter: Payer: Self-pay | Admitting: Cardiovascular Disease

## 2022-11-03 ENCOUNTER — Telehealth: Payer: Self-pay

## 2022-11-03 ENCOUNTER — Other Ambulatory Visit (HOSPITAL_COMMUNITY): Payer: Self-pay

## 2022-11-03 DIAGNOSIS — E785 Hyperlipidemia, unspecified: Secondary | ICD-10-CM

## 2022-11-03 DIAGNOSIS — E7800 Pure hypercholesterolemia, unspecified: Secondary | ICD-10-CM

## 2022-11-03 DIAGNOSIS — I700 Atherosclerosis of aorta: Secondary | ICD-10-CM

## 2022-11-03 MED ORDER — REPATHA SURECLICK 140 MG/ML ~~LOC~~ SOAJ: 1.0000 mL | SUBCUTANEOUS | 3 refills | Status: DC

## 2022-11-03 NOTE — Telephone Encounter (Signed)
Rx for Repatha sent to pharmacy

## 2022-11-03 NOTE — Addendum Note (Signed)
Addended by: Rollen Sox on: 11/03/2022 02:46 PM   Modules accepted: Orders

## 2022-11-03 NOTE — Telephone Encounter (Signed)
Pharmacy Patient Advocate Encounter  Prior Authorization for REPATHA 140 MG/ML INJ has been approved.    Effective dates: 09/25/22 through 11/03/23   Received notification from [facility] that prior authorization for REPATHA 140 MG/ML INJ is needed.    PA submitted on 11/03/22 Key [id number] Status is pending  Karie Soda, CPhT Pharmacy Patient Advocate Specialist Direct Number: [phone] Fax: [phone]

## 2022-11-28 DIAGNOSIS — M0589 Other rheumatoid arthritis with rheumatoid factor of multiple sites: Secondary | ICD-10-CM | POA: Diagnosis not present

## 2022-12-03 NOTE — Progress Notes (Signed)
Synopsis: Referred for cough following URI by Kathalene Frames, *  Subjective:   PATIENT ID: Patricia Long GENDER: female DOB: 1951-08-20, MRN: [id number]  No chief complaint on file.  71yF with history of RA on remicade/MTX (has been on for years)/plaquenil sees Dr. Trudie Reed, R breast CA had mastectomy no chemoXRT, recurrent bronchitis, endometriosis, GERD just watches diet, esophagitis, never smoker referred for cough  2 years ago had covid-19 with prolonged recovery but wasn't hospitalized.   Last April had pneumonia and took a long time to recover  She says she had bronchitis back in 07/2022 and since then has had cough. Usually dry cough. No DOE but hasn't really challenged herself recently. Intermittent hoarseness as well. No recent changes to her RA medications. Disease activity is pretty good right now wrt her RA. Doesn't feel like she is flaring. She has no sinus congestion, only occasional postnasal drainage. 30 day course of ppi failed to make much of a difference.   Otherwise pertinent review of systems is negative.  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 Has lived in [place], [place] in addition to [place]. No recent travel before this last bout of cough.   Past Medical History:  Diagnosis Date   Arthritis    RA   Basal cell carcinoma ~ 2013   "legs"   Cancer ([place])    right breast cancer   Cataract    Chronic bronchitis ([place])    "I've had it several times but not q yr" (01/13/2015)   Endometriosis    Fibroid    Hypertension    Irritable bowel syndrome    Pneumonia 1970's X 1; ~ 2013   PONV (postoperative nausea and vomiting)    Rheumatoid arthritis(714.0)      Family History  Problem Relation Age of Onset   Heart disease Mother    Heart attack Mother    Heart disease Father    Heart attack Father    Colon cancer Maternal Aunt    Colon cancer Paternal Uncle    Breast  cancer Maternal Aunt 79   Colon polyps Neg Hx    Diabetes Neg Hx    Kidney disease Neg Hx    Esophageal cancer Neg Hx    Stomach cancer Neg Hx    Rectal cancer Neg Hx      Past Surgical History:  Procedure Laterality Date   BASAL CELL CARCINOMA EXCISION  ~ 2013   "legs"   BREAST CYST EXCISION Bilateral 1992   NODULE EXCISION OF RIGHT AND LEFT BREAST   BREAST SURGERY     and reconstruction 2020   COLONOSCOPY     FOOT SURGERY Bilateral ~ 2006-2008   "reconstruction"   JOINT REPLACEMENT Left 2009   WRIST AND FINGER    KNEE ARTHROSCOPY Right 1980's   PELVIC LAPAROSCOPY  '82 AND '87   TOTAL ABDOMINAL HYSTERECTOMY  1997   TAH/BSO    Social History   Socioeconomic History   Marital status: Widowed    Spouse name: Not on file   Number of children: 0   Years of education: Not on file   Highest education level: Not on file  Occupational History   Occupation: Retired  Tobacco Use   Smoking status: Never   Smokeless tobacco: Never  Vaping Use   Vaping Use: Never used  Substance and Sexual Activity   Alcohol use: Yes    Alcohol/week:  1.0 standard drink of alcohol    Types: 1 Glasses of wine per week   Drug use: No   Sexual activity: Not Currently    Birth control/protection: Surgical  Other Topics Concern   Not on file  Social History Narrative   Lives with husband in a 2 story home.  Has no children.  Retired from Press photographer.  Education: some Counsellor.   Social Determinants of Radio broadcast assistant Strain: Not on file  Food Insecurity: Not on file  Transportation Needs: Not on file  Physical Activity: Not on file  Stress: Not on file  Social Connections: Not on file  Intimate Partner Violence: Not on file     Allergies  Allergen Reactions   Codeine Nausea Only and Rash    Other reaction(s): rash   Prochlorperazine Rash    Other reaction(s): Seizure, Unknown   Crestor [Rosuvastatin]     myalgias   Etanercept     Other reaction(s): lost efficacy    Lipitor [Atorvastatin]     Myalgias, brain fog   Prochlorperazine Edisylate Other (See Comments)    Compazine, "muscular" from base of spine to skull jerked forward, major spasma   Prochlorperazine Edisylate     Other reaction(s): Other (See Comments) compazine   Tape Rash    Plastic tape, not paper tape     Outpatient Medications Prior to Visit  Medication Sig Dispense Refill   amLODipine (NORVASC) 5 MG tablet Take 5 mg by mouth daily.     cetirizine (ZYRTEC) 10 MG tablet Take 10 mg by mouth daily as needed (remicaide infusion).     Coenzyme Q10 200 MG capsule Take 1 capsule (200 mg total) by mouth daily. 90 capsule 3   Evolocumab (REPATHA SURECLICK) [id number] MG/ML SOAJ Inject 140 mg into the skin every 14 (fourteen) days. 6 mL 3   ezetimibe (ZETIA) 10 MG tablet      fexofenadine (ALLEGRA) 180 MG tablet Take 180 mg by mouth daily as needed.     folic acid (FOLVITE) 1 MG tablet Take 1 mg by mouth daily.     gabapentin (NEURONTIN) 300 MG capsule Take 1 capsule (300 mg total) by mouth at bedtime. 90 capsule 4   hydroxychloroquine (PLAQUENIL) 200 MG tablet Take 200 mg by mouth daily.     inFLIXimab (REMICADE) 100 MG injection Inject 100 mg into the vein every 8 (eight) weeks.     meloxicam (MOBIC) 15 MG tablet Take 1 tablet by mouth daily as needed.     Methotrexate, Anti-Rheumatic, (METHOTREXATE, PF, [place]) Inject 20 mg into the skin once a week.     Multiple Vitamin (MULTIVITAMIN) tablet Take 1 tablet by mouth daily.     omeprazole (PRILOSEC) 40 MG capsule Take 1 capsule (40 mg total) by mouth daily. 90 capsule 0   pramipexole (MIRAPEX) 0.125 MG tablet      traZODone (DESYREL) 50 MG tablet as directed.     valsartan (DIOVAN) 80 MG tablet Take 80 mg by mouth daily.     No facility-administered medications prior to visit.       Objective:   Physical Exam:  General appearance: 72 y.o., female, NAD, conversant  Eyes: anicteric sclerae; PERRL, tracking appropriately HENT: NCAT;  MMM Neck: Trachea midline; no lymphadenopathy, no JVD Lungs: CTAB, no crackles, no wheeze, with normal respiratory effort CV: RRR, no murmur  Abdomen: Soft, non-tender; non-distended, BS present  Extremities: No peripheral edema, warm Skin: Normal turgor and texture; no rash Psych: Appropriate  affect Neuro: Alert and oriented to person and place, no focal deficit     There were no vitals filed for this visit.    on RA BMI Readings from Last 3 Encounters:  10/20/22 20.37 kg/m  01/31/22 19.95 kg/m  10/27/21 20.83 kg/m   Wt Readings from Last 3 Encounters:  10/20/22 134 lb (60.8 kg)  01/31/22 131 lb 3.2 oz (59.5 kg)  10/27/21 137 lb (62.1 kg)     CBC    Component Value Date/Time   WBC 5.3 01/01/2020 1600   RBC 4.43 01/01/2020 1600   HGB 15.0 01/01/2020 1622   HGB 13.0 03/05/2019 0837   HCT 44.0 01/01/2020 1622   PLT 156 01/01/2020 1600   PLT 258 03/05/2019 0837   MCV 98.0 01/01/2020 1600   MCH 32.7 01/01/2020 1600   MCHC 33.4 01/01/2020 1600   RDW 14.9 01/01/2020 1600   LYMPHSABS 0.7 01/01/2020 1600   MONOABS 0.8 01/01/2020 1600   EOSABS 0.0 01/01/2020 1600   BASOSABS 0.0 01/01/2020 1600     Chest Imaging: CXR 08/30/22 reviewed by me unremarkable  CT cardiac 12/21/21 lung bases reviewed by me unremarkable  Pulmonary Functions Testing Results:     No data to display            Echocardiogram 2016:   - Left ventricle: The cavity size was normal. Systolic function was    normal. The estimated ejection fraction was in the range of 55%    to 60%. Wall motion was normal; there were no regional wall    motion abnormalities.  - Atrial septum: No defect or patent foramen ovale was identified.      Assessment & Plan:   # Chronic cough Unclear etiology - irritable larynx/LPR possible. Recent CT cardiac without evidence of ILD either related to RA or MTX exposure though it was obtained before cough became more consistent. With hoarseness another  consideration would be cricoarytenoid RA involvement.   Plan: - start omeprazole 40 mg every day 30 minutes before dinner everyday till next visit - HRCT Chest  - we will schedule PFTs on same day as next visit in 8 weeks - look for dextromethorphan over the counter for cough suppressant  - discussed lifestyle measures for chronic throat clearing and reflux - if no better, diagnosis unclear at next visit consideration of ENT referral   RTC 8 weeks    Maryjane Hurter, MD [facility] 12/03/2022 6:42 PM

## 2022-12-06 ENCOUNTER — Ambulatory Visit (INDEPENDENT_AMBULATORY_CARE_PROVIDER_SITE_OTHER): Payer: Medicare Other | Admitting: Student

## 2022-12-06 ENCOUNTER — Encounter: Payer: Self-pay | Admitting: Student

## 2022-12-06 VITALS — BP 132/82 | HR 67 | Ht 68.0 in | Wt 129.4 lb

## 2022-12-06 DIAGNOSIS — R053 Chronic cough: Secondary | ICD-10-CM

## 2022-12-06 DIAGNOSIS — J189 Pneumonia, unspecified organism: Secondary | ICD-10-CM

## 2022-12-06 LAB — PULMONARY FUNCTION TEST
DL/VA % pred: 127 %
DL/VA: 5.1 ml/min/mmHg/L
DLCO cor % pred: 90 %
DLCO cor: 19.85 ml/min/mmHg
DLCO unc % pred: 90 %
DLCO unc: 19.85 ml/min/mmHg
FEF 25-75 Post: 1.76 L/sec
FEF 25-75 Pre: 1.41 L/sec
FEF2575-%Change-Post: 25 %
FEF2575-%Pred-Post: 88 %
FEF2575-%Pred-Pre: 70 %
FEV1-%Change-Post: 5 %
FEV1-%Pred-Post: 75 %
FEV1-%Pred-Pre: 71 %
FEV1-Post: 1.92 L
FEV1-Pre: 1.82 L
FEV1FVC-%Change-Post: 5 %
FEV1FVC-%Pred-Pre: 99 %
FEV6-%Change-Post: 1 %
FEV6-%Pred-Post: 75 %
FEV6-%Pred-Pre: 74 %
FEV6-Post: 2.44 L
FEV6-Pre: 2.4 L
FEV6FVC-%Change-Post: 1 %
FEV6FVC-%Pred-Post: 104 %
FEV6FVC-%Pred-Pre: 103 %
FVC-%Change-Post: 0 %
FVC-%Pred-Post: 71 %
FVC-%Pred-Pre: 71 %
FVC-Post: 2.44 L
FVC-Pre: 2.44 L
Post FEV1/FVC ratio: 79 %
Post FEV6/FVC ratio: 100 %
Pre FEV1/FVC ratio: 75 %
Pre FEV6/FVC Ratio: 98 %
RV % pred: 102 %
RV: 2.51 L
TLC % pred: 83 %
TLC: 4.72 L

## 2022-12-06 NOTE — Progress Notes (Signed)
Full PFT performed today. °

## 2022-12-06 NOTE — Patient Instructions (Addendum)
-   labs today - if productive cough, low grade fever, unintentional weight loss and/or drenching night sweats then we can consider bronchoscopy/bronchoalveolar lavage to look for atypical infection like nontuberculous mycobacteria. Or if you're simply tired of cough we could consider it. - Can consider placing referral for ENT to see you for hoarseness/cough in case RA playing some role in impaired laryngeal mobility (cricoarytenoid dysfunction) - PFTs (lung function tests) and CT Chest in a year - ok to stay on methotrexate - see you in a year or sooner if need be!

## 2022-12-06 NOTE — Patient Instructions (Signed)
Full PFT performed today. °

## 2022-12-07 LAB — IGG, IGA, IGM
IgG (Immunoglobin G), Serum: 983 mg/dL (ref 600–1540)
IgM, Serum: 115 mg/dL (ref 50–300)
Immunoglobulin A: 189 mg/dL (ref 70–320)

## 2022-12-12 DIAGNOSIS — R7301 Impaired fasting glucose: Secondary | ICD-10-CM | POA: Diagnosis not present

## 2022-12-12 DIAGNOSIS — G6289 Other specified polyneuropathies: Secondary | ICD-10-CM | POA: Diagnosis not present

## 2023-01-09 DIAGNOSIS — M0589 Other rheumatoid arthritis with rheumatoid factor of multiple sites: Secondary | ICD-10-CM | POA: Diagnosis not present

## 2023-01-14 ENCOUNTER — Other Ambulatory Visit: Payer: Self-pay | Admitting: Student

## 2023-01-15 DIAGNOSIS — H52223 Regular astigmatism, bilateral: Secondary | ICD-10-CM | POA: Diagnosis not present

## 2023-01-15 DIAGNOSIS — H401131 Primary open-angle glaucoma, bilateral, mild stage: Secondary | ICD-10-CM | POA: Diagnosis not present

## 2023-01-15 DIAGNOSIS — H524 Presbyopia: Secondary | ICD-10-CM | POA: Diagnosis not present

## 2023-01-15 DIAGNOSIS — H5212 Myopia, left eye: Secondary | ICD-10-CM | POA: Diagnosis not present

## 2023-01-16 DIAGNOSIS — L820 Inflamed seborrheic keratosis: Secondary | ICD-10-CM | POA: Diagnosis not present

## 2023-01-16 DIAGNOSIS — B078 Other viral warts: Secondary | ICD-10-CM | POA: Diagnosis not present

## 2023-01-16 DIAGNOSIS — B351 Tinea unguium: Secondary | ICD-10-CM | POA: Diagnosis not present

## 2023-01-16 DIAGNOSIS — B029 Zoster without complications: Secondary | ICD-10-CM | POA: Diagnosis not present

## 2023-02-20 DIAGNOSIS — M0589 Other rheumatoid arthritis with rheumatoid factor of multiple sites: Secondary | ICD-10-CM | POA: Diagnosis not present

## 2023-02-20 DIAGNOSIS — Z79899 Other long term (current) drug therapy: Secondary | ICD-10-CM | POA: Diagnosis not present

## 2023-02-20 DIAGNOSIS — R5383 Other fatigue: Secondary | ICD-10-CM | POA: Diagnosis not present

## 2023-04-03 DIAGNOSIS — Z79899 Other long term (current) drug therapy: Secondary | ICD-10-CM | POA: Diagnosis not present

## 2023-04-03 DIAGNOSIS — M0589 Other rheumatoid arthritis with rheumatoid factor of multiple sites: Secondary | ICD-10-CM | POA: Diagnosis not present

## 2023-04-03 DIAGNOSIS — R5383 Other fatigue: Secondary | ICD-10-CM | POA: Diagnosis not present

## 2023-04-16 DIAGNOSIS — Z13820 Encounter for screening for osteoporosis: Secondary | ICD-10-CM | POA: Diagnosis not present

## 2023-04-16 DIAGNOSIS — E785 Hyperlipidemia, unspecified: Secondary | ICD-10-CM | POA: Diagnosis not present

## 2023-04-16 DIAGNOSIS — Z23 Encounter for immunization: Secondary | ICD-10-CM | POA: Diagnosis not present

## 2023-04-16 DIAGNOSIS — M0540 Rheumatoid myopathy with rheumatoid arthritis of unspecified site: Secondary | ICD-10-CM | POA: Diagnosis not present

## 2023-04-16 DIAGNOSIS — Z0000 Encounter for general adult medical examination without abnormal findings: Secondary | ICD-10-CM | POA: Diagnosis not present

## 2023-04-16 DIAGNOSIS — R053 Chronic cough: Secondary | ICD-10-CM | POA: Diagnosis not present

## 2023-04-16 DIAGNOSIS — C50911 Malignant neoplasm of unspecified site of right female breast: Secondary | ICD-10-CM | POA: Diagnosis not present

## 2023-04-16 DIAGNOSIS — G629 Polyneuropathy, unspecified: Secondary | ICD-10-CM | POA: Diagnosis not present

## 2023-04-16 DIAGNOSIS — K219 Gastro-esophageal reflux disease without esophagitis: Secondary | ICD-10-CM | POA: Diagnosis not present

## 2023-04-16 DIAGNOSIS — I10 Essential (primary) hypertension: Secondary | ICD-10-CM | POA: Diagnosis not present

## 2023-04-16 DIAGNOSIS — Z79899 Other long term (current) drug therapy: Secondary | ICD-10-CM | POA: Diagnosis not present

## 2023-04-16 DIAGNOSIS — Z1331 Encounter for screening for depression: Secondary | ICD-10-CM | POA: Diagnosis not present

## 2023-04-16 DIAGNOSIS — I700 Atherosclerosis of aorta: Secondary | ICD-10-CM | POA: Diagnosis not present

## 2023-04-16 DIAGNOSIS — M8580 Other specified disorders of bone density and structure, unspecified site: Secondary | ICD-10-CM | POA: Diagnosis not present

## 2023-04-17 DIAGNOSIS — Z9011 Acquired absence of right breast and nipple: Secondary | ICD-10-CM | POA: Diagnosis not present

## 2023-04-17 DIAGNOSIS — Z853 Personal history of malignant neoplasm of breast: Secondary | ICD-10-CM | POA: Diagnosis not present

## 2023-04-17 DIAGNOSIS — Z08 Encounter for follow-up examination after completed treatment for malignant neoplasm: Secondary | ICD-10-CM | POA: Diagnosis not present

## 2023-04-30 DIAGNOSIS — D0511 Intraductal carcinoma in situ of right breast: Secondary | ICD-10-CM | POA: Diagnosis not present

## 2023-04-30 DIAGNOSIS — Z9889 Other specified postprocedural states: Secondary | ICD-10-CM | POA: Diagnosis not present

## 2023-04-30 DIAGNOSIS — Z9011 Acquired absence of right breast and nipple: Secondary | ICD-10-CM | POA: Diagnosis not present

## 2023-04-30 DIAGNOSIS — Z1231 Encounter for screening mammogram for malignant neoplasm of breast: Secondary | ICD-10-CM | POA: Diagnosis not present

## 2023-05-01 DIAGNOSIS — Z79899 Other long term (current) drug therapy: Secondary | ICD-10-CM | POA: Diagnosis not present

## 2023-05-01 DIAGNOSIS — Z681 Body mass index (BMI) 19 or less, adult: Secondary | ICD-10-CM | POA: Diagnosis not present

## 2023-05-01 DIAGNOSIS — M79642 Pain in left hand: Secondary | ICD-10-CM | POA: Diagnosis not present

## 2023-05-01 DIAGNOSIS — M79641 Pain in right hand: Secondary | ICD-10-CM | POA: Diagnosis not present

## 2023-05-01 DIAGNOSIS — M0589 Other rheumatoid arthritis with rheumatoid factor of multiple sites: Secondary | ICD-10-CM | POA: Diagnosis not present

## 2023-05-01 DIAGNOSIS — G5793 Unspecified mononeuropathy of bilateral lower limbs: Secondary | ICD-10-CM | POA: Diagnosis not present

## 2023-05-01 DIAGNOSIS — M1991 Primary osteoarthritis, unspecified site: Secondary | ICD-10-CM | POA: Diagnosis not present

## 2023-05-14 DIAGNOSIS — Z853 Personal history of malignant neoplasm of breast: Secondary | ICD-10-CM | POA: Diagnosis not present

## 2023-05-14 DIAGNOSIS — N958 Other specified menopausal and perimenopausal disorders: Secondary | ICD-10-CM | POA: Diagnosis not present

## 2023-05-14 DIAGNOSIS — M8588 Other specified disorders of bone density and structure, other site: Secondary | ICD-10-CM | POA: Diagnosis not present

## 2023-05-14 DIAGNOSIS — M069 Rheumatoid arthritis, unspecified: Secondary | ICD-10-CM | POA: Diagnosis not present

## 2023-05-15 DIAGNOSIS — M0589 Other rheumatoid arthritis with rheumatoid factor of multiple sites: Secondary | ICD-10-CM | POA: Diagnosis not present

## 2023-05-29 DIAGNOSIS — H16302 Unspecified interstitial keratitis, left eye: Secondary | ICD-10-CM | POA: Diagnosis not present

## 2023-06-15 ENCOUNTER — Ambulatory Visit: Payer: Medicare Other | Attending: Cardiovascular Disease | Admitting: Cardiovascular Disease

## 2023-06-15 ENCOUNTER — Encounter: Payer: Self-pay | Admitting: Cardiovascular Disease

## 2023-06-15 VITALS — BP 128/62 | HR 53 | Ht 68.0 in | Wt 130.0 lb

## 2023-06-15 DIAGNOSIS — I10 Essential (primary) hypertension: Secondary | ICD-10-CM | POA: Diagnosis not present

## 2023-06-15 DIAGNOSIS — E7800 Pure hypercholesterolemia, unspecified: Secondary | ICD-10-CM

## 2023-06-15 DIAGNOSIS — M069 Rheumatoid arthritis, unspecified: Secondary | ICD-10-CM

## 2023-06-15 DIAGNOSIS — I700 Atherosclerosis of aorta: Secondary | ICD-10-CM

## 2023-06-15 NOTE — Progress Notes (Signed)
Cardiology office note   Date:  06/15/2023   ID:  [name], [name] [birth year], MRN [id number]  PCP:  Chilton Greathouse, MD  Cardiologist:  Abron Neddo Electrophysiologist:  None   Evaluation Performed:  Follow-Up Visit  Chief Complaint: Hypercholesterolemia, hypertension, aortic atherosclerosis  History of Present Illness:    Patricia Long is a 72 y.o. female with moderate hypercholesterolemia, systolic hypertension and aortic atherosclerosis, but without known symptomatic coronary or peripheral vascular disease.  Her brother Patricia Long is also my patient.  She has done well from a cardiac point of view.  Blood pressure was elevated for period of about 2 weeks and she upped the dose of amlodipine to 10 mg daily with good results.  After about 2 weeks she was able to decrease the dose back again.  She was not taking extra NSAIDs during that time.  No clear reason for the elevated blood pressure.  The patient specifically denies any chest pain at rest or with exertion, dyspnea at rest or with exertion, orthopnea, paroxysmal nocturnal dyspnea, syncope, palpitations, focal neurological deficits, intermittent claudication, lower extremity edema, unexplained weight gain, cough, hemoptysis or wheezing.  She does not have claudication, but does have some neuropathic symptoms in both feet.  She has seen a neurologist.  Surprisingly found to have elevated mercury levels and asked to stay off fish.  She has arthritic pain at the base of both thumbs.  Imaging studies have shown evidence of aortic atherosclerosis, but with normal caliber aorta and without any detectable coronary or peripheral arterial disease of clinical importance.  She underwent a [facility] screening test in January 2022.  This showed no evidence of carotid disease or aortic aneurysm and her lower extremity ABI testing was normal.  Coronary calcium scoring showed an average calcium score (46 percentile).    She is tolerating  Repatha well.  She had problems with myopathy while taking statins and liver test abnormalities when she took Praluent.  Also on Remicade and methotrexate for rheumatoid arthritis.  On Repatha she has not excellent lipid parameters (total cholesterol 129, HDL 51, LDL 57).  She is also still taking ezetimibe.   Past Medical History:  Diagnosis Date   Arthritis    RA   Basal cell carcinoma ~ 2013   "legs"   Cancer (HCC)    right breast cancer   Cataract    Chronic bronchitis (HCC)    "I've had it several times but not q yr" (01/13/2015)   Endometriosis    Fibroid    Hypertension    Irritable bowel syndrome    Pneumonia 1970's X 1; ~ 2013   PONV (postoperative nausea and vomiting)    Rheumatoid arthritis(714.0)    Past Surgical History:  Procedure Laterality Date   BASAL CELL CARCINOMA EXCISION  ~ 2013   "legs"   BREAST CYST EXCISION Bilateral 1992   NODULE EXCISION OF RIGHT AND LEFT BREAST   BREAST SURGERY     and reconstruction 2020   COLONOSCOPY     FOOT SURGERY Bilateral ~ 2006-2008   "reconstruction"   JOINT REPLACEMENT Left 2009   WRIST AND FINGER    KNEE ARTHROSCOPY Right 1980's   PELVIC LAPAROSCOPY  '82 AND '87   TOTAL ABDOMINAL HYSTERECTOMY  1997   TAH/BSO     Current Meds  Medication Sig   amLODipine (NORVASC) 5 MG tablet Take 5 mg by mouth daily.   cetirizine (ZYRTEC) 10 MG tablet Take 10 mg by mouth daily as needed (  remicaide infusion).   diclofenac (VOLTAREN) 75 MG EC tablet Take 75 mg by mouth as needed.   Evolocumab (REPATHA SURECLICK) 140 MG/ML SOAJ Inject 140 mg into the skin every 14 (fourteen) days.   ezetimibe (ZETIA) 10 MG tablet    folic acid (FOLVITE) 1 MG tablet Take 1 mg by mouth daily.   gabapentin (NEURONTIN) 300 MG capsule Take 1 capsule (300 mg total) by mouth at bedtime.   hydroxychloroquine (PLAQUENIL) 200 MG tablet Take 200 mg by mouth daily.   inFLIXimab (REMICADE) 100 MG injection Inject 100 mg into the vein every 8 (eight) weeks.    loteprednol (LOTEMAX) 0.5 % ophthalmic suspension Place 1 drop into the left eye 4 (four) times daily.   meloxicam (MOBIC) 15 MG tablet Take 1 tablet by mouth daily as needed.   Methotrexate Sodium (METHOTREXATE, PF,) 50 MG/2ML injection Inject into the muscle once a week.   Methotrexate, Anti-Rheumatic, (METHOTREXATE, PF, [place]) Inject 20 mg into the skin once a week.   pramipexole (MIRAPEX) 0.125 MG tablet    traZODone (DESYREL) 50 MG tablet as directed.   tretinoin (RETIN-A) 0.025 % cream Apply topically at bedtime.   valsartan (DIOVAN) 80 MG tablet Take 80 mg by mouth daily.     Allergies:   Codeine, Prochlorperazine, Crestor [rosuvastatin], Etanercept, Lipitor [atorvastatin], Prochlorperazine edisylate, Prochlorperazine edisylate, and Tape   Social History   Tobacco Use   Smoking status: Never   Smokeless tobacco: Never  Vaping Use   Vaping status: Never Used  Substance Use Topics   Alcohol use: Yes    Alcohol/week: 1.0 standard drink of alcohol    Types: 1 Glasses of wine per week   Drug use: No     Family Hx: The patient's family history includes Breast cancer (age of onset: 84) in her maternal aunt; Colon cancer in her maternal aunt and paternal uncle; Heart attack in her father and mother; Heart disease in her father and mother. There is no history of Colon polyps, Diabetes, Kidney disease, Esophageal cancer, Stomach cancer, or Rectal cancer.  ROS:   Please see the history of present illness.     All other systems reviewed and are negative.  Prior CV studies:   The following studies were reviewed today:  January 2022 Cholesterol 194, HDL 52, LDL 125, triglycerides 83 Line testing with normal carotid scan and normal ABI of the lower extremities Labs/Other Tests and Data Reviewed:    EKG: Ordered today and personally reviewed shows sinus bradycardia at 59 bpm and an incomplete right bundle branch block with a QRS of 106 ms duration.  There are no repolarization  abnormalities.  Recent Labs: Labs from PCP from January 21, 2019 Normal liver function tests, CRP 1.0, creatinine 0.89, hemoglobin 13.6 Total cholesterol 241, HDL 76, LDL 127, triglycerides 191  Recent Lipid Panel Lab Results  Component Value Date/Time   CHOL 198 12/29/2021 11:06 AM   TRIG 60 12/29/2021 11:06 AM   HDL 51 12/29/2021 11:06 AM   CHOLHDL 3.9 12/29/2021 11:06 AM   LDLCALC 136 (H) 12/29/2021 11:06 AM    Wt Readings from Last 3 Encounters:  06/15/23 130 lb (59 kg)  12/06/22 129 lb 6.4 oz (58.7 kg)  10/20/22 134 lb (60.8 kg)     Objective:    Vital Signs:  BP 128/62   Pulse (!) 53   Ht 5\' 8"  (1.727 m)   Wt 130 lb (59 kg)   SpO2 97%   BMI 19.77 kg/m  General: Alert, oriented x3, no distress, very lean Head: no evidence of trauma, PERRL, EOMI, no exophtalmos or lid lag, no myxedema, no xanthelasma; normal ears, nose and oropharynx Neck: normal jugular venous pulsations and no hepatojugular reflux; brisk carotid pulses without delay and no carotid bruits Chest: clear to auscultation, no signs of consolidation by percussion or palpation, normal fremitus, symmetrical and full respiratory excursions Cardiovascular: normal position and quality of the apical impulse, regular rhythm, normal first and second heart sounds, no murmurs, rubs or gallops Abdomen: no tenderness or distention, no masses by palpation, no abnormal pulsatility or arterial bruits, normal bowel sounds, no hepatosplenomegaly Extremities: no clubbing, cyanosis or edema; 2+ radial, ulnar and brachial pulses bilaterally; 2+ right femoral, posterior tibial and dorsalis pedis pulses; 2+ left femoral, posterior tibial and dorsalis pedis pulses; no subclavian or femoral bruits Neurological: grossly nonfocal Psych: Normal mood and affect   ASSESSMENT & PLAN:    1. Hyperlipidemia, unspecified hyperlipidemia type   2. Aortic arch atherosclerosis (HCC)   3. Essential hypertension      HTN: Temporarily  out-of-control, back in her usual range. HLP: Doing well on Repatha.  I do not think she needs to take ezetimibe anymore.  Had statin myopathy and had elevated liver function tests with Praluent. Aortic atherosclerosis: She has no symptoms of coronary or peripheral vascular disease and has never had a CVA/TIA, but this marks her at risk for development of symptomatic vascular complications.  Coronary calcium Score was average for age. RA: On methotrexate, Remicade and hydroxychloroquine.  Uses NSAIDs intermittently.  Not requiring steroids.  There are no Patient Instructions on file for this visit.   Tests Ordered: Orders Placed This Encounter  Procedures   EKG 12-Lead    Medication Changes: No orders of the defined types were placed in this encounter.   Disposition:  Follow up  1 year  Signed, Thurmon Fair, MD  06/15/2023 8:34 AM    [facility]

## 2023-06-15 NOTE — Patient Instructions (Signed)
Medication Instructions:  No changes *If you need a refill on your cardiac medications before your next appointment, please call your pharmacy*  Follow-Up: At [facility], you and your health needs are our priority.  As part of our continuing mission to provide you with exceptional heart care, we have created designated Provider Care Teams.  These Care Teams include your primary Cardiologist (physician) and Advanced Practice Providers (APPs -  Physician Assistants and Nurse Practitioners) who all work together to provide you with the care you need, when you need it.  We recommend signing up for the patient portal called "MyChart".  Sign up information is provided on this After Visit Summary.  MyChart is used to connect with patients for Virtual Visits (Telemedicine).  Patients are able to view lab/test results, encounter notes, upcoming appointments, etc.  Non-urgent messages can be sent to your provider as well.   To learn more about what you can do with MyChart, go to [email or web address].    Your next appointment:   1 year(s)  Provider:   Thurmon Fair, MD

## 2023-06-20 ENCOUNTER — Other Ambulatory Visit: Payer: Self-pay | Admitting: Ophthalmology

## 2023-06-20 DIAGNOSIS — D3132 Benign neoplasm of left choroid: Secondary | ICD-10-CM | POA: Diagnosis not present

## 2023-06-20 DIAGNOSIS — D3131 Benign neoplasm of right choroid: Secondary | ICD-10-CM | POA: Diagnosis not present

## 2023-06-20 DIAGNOSIS — H5712 Ocular pain, left eye: Secondary | ICD-10-CM

## 2023-06-20 DIAGNOSIS — H2513 Age-related nuclear cataract, bilateral: Secondary | ICD-10-CM | POA: Diagnosis not present

## 2023-06-20 DIAGNOSIS — H43813 Vitreous degeneration, bilateral: Secondary | ICD-10-CM | POA: Diagnosis not present

## 2023-06-20 DIAGNOSIS — H11152 Pinguecula, left eye: Secondary | ICD-10-CM | POA: Diagnosis not present

## 2023-06-21 ENCOUNTER — Other Ambulatory Visit: Payer: Self-pay | Admitting: Ophthalmology

## 2023-06-21 DIAGNOSIS — H5712 Ocular pain, left eye: Secondary | ICD-10-CM

## 2023-06-26 DIAGNOSIS — M0589 Other rheumatoid arthritis with rheumatoid factor of multiple sites: Secondary | ICD-10-CM | POA: Diagnosis not present

## 2023-07-03 ENCOUNTER — Ambulatory Visit
Admission: RE | Admit: 2023-07-03 | Discharge: 2023-07-03 | Disposition: A | Discharge status: Home / Self Care | Payer: Medicare Other | Source: Ambulatory Visit | Attending: Ophthalmology | Admitting: Ophthalmology

## 2023-07-03 DIAGNOSIS — R9089 Other abnormal findings on diagnostic imaging of central nervous system: Secondary | ICD-10-CM | POA: Diagnosis not present

## 2023-07-03 DIAGNOSIS — H5712 Ocular pain, left eye: Secondary | ICD-10-CM

## 2023-07-03 DIAGNOSIS — I6782 Cerebral ischemia: Secondary | ICD-10-CM | POA: Diagnosis not present

## 2023-07-03 DIAGNOSIS — H47291 Other optic atrophy, right eye: Secondary | ICD-10-CM | POA: Diagnosis not present

## 2023-07-03 DIAGNOSIS — G319 Degenerative disease of nervous system, unspecified: Secondary | ICD-10-CM | POA: Diagnosis not present

## 2023-07-03 MED ORDER — GADOPICLENOL 0.5 MMOL/ML IV SOLN
6.0000 mL | Freq: Once | INTRAVENOUS | Status: AC | PRN
  Administered 2023-07-03: 6 mL via INTRAVENOUS

## 2023-07-17 DIAGNOSIS — J3489 Other specified disorders of nose and nasal sinuses: Secondary | ICD-10-CM | POA: Diagnosis not present

## 2023-07-17 DIAGNOSIS — D329 Benign neoplasm of meninges, unspecified: Secondary | ICD-10-CM | POA: Diagnosis not present

## 2023-07-20 ENCOUNTER — Other Ambulatory Visit: Payer: Self-pay | Admitting: Otolaryngology

## 2023-07-25 ENCOUNTER — Other Ambulatory Visit: Payer: Self-pay

## 2023-07-25 ENCOUNTER — Encounter (HOSPITAL_COMMUNITY): Payer: Self-pay | Admitting: Otolaryngology

## 2023-07-25 NOTE — Anesthesia Preprocedure Evaluation (Signed)
Anesthesia Evaluation  Patient identified by MRN, date of birth, ID band Patient awake    Reviewed: Allergy & Precautions, H&P , NPO status , Patient's Chart, lab work & pertinent test results  History of Anesthesia Complications (+) PONV and history of anesthetic complications  Airway Mallampati: II  TM Distance: >3 FB Neck ROM: Full    Dental no notable dental hx. (+) Teeth Intact, Dental Advisory Given   Pulmonary neg pulmonary ROS, pneumonia   Pulmonary exam normal breath sounds clear to auscultation       Cardiovascular Exercise Tolerance: Good hypertension, Pt. on medications negative cardio ROS Normal cardiovascular exam Rhythm:Regular Rate:Normal  ECHO 24 EF 50% NL valves   Neuro/Psych  Neuromuscular disease negative neurological ROS  negative psych ROS   GI/Hepatic negative GI ROS, Neg liver ROS,GERD  ,,  Endo/Other  negative endocrine ROS    Renal/GU negative Renal ROS  negative genitourinary   Musculoskeletal negative musculoskeletal ROS (+) Arthritis , Osteoarthritis,    Abdominal   Peds negative pediatric ROS (+)  Hematology negative hematology ROS (+)   Anesthesia Other Findings   Reproductive/Obstetrics negative OB ROS                             Anesthesia Physical Anesthesia Plan  ASA: 3  Anesthesia Plan: General   Post-op Pain Management: Celebrex PO (pre-op)*, Tylenol PO (pre-op)* and Minimal or no pain anticipated   Induction: Intravenous  PONV Risk Score and Plan: 4 or greater and Ondansetron, Dexamethasone and TIVA  Airway Management Planned: Oral ETT  Additional Equipment: None  Intra-op Plan:   Post-operative Plan: Extubation in OR  Informed Consent: I have reviewed the patients History and Physical, chart, labs and discussed the procedure including the risks, benefits and alternatives for the proposed anesthesia with the patient or authorized  representative who has indicated his/her understanding and acceptance.       Plan Discussed with: Anesthesiologist and CRNA  Anesthesia Plan Comments: (  )        Anesthesia Quick Evaluation

## 2023-07-25 NOTE — Progress Notes (Signed)
SDW CALL  Patient was given pre-op instructions over the phone. The opportunity was given for the patient to ask questions. No further questions asked. Patient verbalized understanding of instructions given.   PCP -  Cardiologist -   PPM/ICD -  Device Orders -  Rep Notified -   Chest x-ray -  EKG -  Stress Test -  ECHO -  Cardiac Cath -   Sleep Study -  CPAP -   Fasting Blood Sugar -  Checks Blood Sugar _____ times a day  Blood Thinner Instructions: Aspirin Instructions:  ERAS Protcol - PRE-SURGERY Ensure or G2-   COVID TEST-    Anesthesia review:   Patient denies shortness of breath, fever, cough and chest pain over the phone call    Surgical Instructions    Your procedure is scheduled on October 31  Report to [facility] Main Entrance "A" at 5:30 A.M., then check in with the Admitting office.  Call this number if you have problems the morning of surgery:  [phone]    Remember:  Do not eat after midnight the night before your surgery  You may drink clear liquids until 0430 the morning of your surgery.   Clear liquids allowed are: Water, Non-Citrus Juices (without pulp), Carbonated Beverages, Clear Tea, Black Coffee ONLY (NO MILK, CREAM OR POWDERED CREAMER of any kind), and Gatorade   Take these medicines the morning of surgery with A SIP OF WATER: Norvasc. PRN- Tylenol.    As of today, STOP taking any Aspirin (unless otherwise instructed by your surgeon), Diclofenac(Voltaren),Aleve, Naproxen, Ibuprofen, Motrin, Advil, Goody's, BC's, all herbal medications, fish oil, and all vitamins.  [facility] is not responsible for any belongings or valuables. .   Do NOT Smoke (Tobacco/Vaping)  24 hours prior to your procedure  If you use a CPAP at night, you may bring your mask for your overnight stay.   Contacts, glasses, hearing aids, dentures or partials may not be worn into surgery, please bring cases for these belongings   Patients discharged the day  of surgery will not be allowed to drive home, and someone needs to stay with them for 24 hours.  Special instructions:    Oral Hygiene is also important to reduce your risk of infection.  Remember - BRUSH YOUR TEETH THE MORNING OF SURGERY WITH YOUR REGULAR TOOTHPASTE   Day of Surgery:  Take a shower the day of or night before with antibacterial soap. Wear Clean/Comfortable clothing the morning of surgery Do not apply any deodorants/lotions.   Do not wear jewelry or makeup Do not wear lotions, powders, perfumes/colognes, or deodorant. Do not shave 48 hours prior to surgery.  Men may shave face and neck. Do not bring valuables to the hospital. Do not wear nail polish, gel polish, artificial nails, or any other type of covering on natural nails (fingers and toes) If you have artificial nails or gel coating that need to be removed by a nail salon, please have this removed prior to surgery. Artificial nails or gel coating may interfere with anesthesia's ability to adequately monitor your vital signs. Remember to brush your teeth WITH YOUR REGULAR TOOTHPASTE.

## 2023-07-26 ENCOUNTER — Encounter (HOSPITAL_COMMUNITY): Payer: Self-pay | Admitting: Otolaryngology

## 2023-07-26 ENCOUNTER — Other Ambulatory Visit: Payer: Self-pay

## 2023-07-26 ENCOUNTER — Ambulatory Visit (HOSPITAL_COMMUNITY)
Admission: RE | Admit: 2023-07-26 | Discharge: 2023-07-26 | Disposition: A | Discharge status: Home / Self Care | Payer: Medicare Other | Attending: Otolaryngology | Admitting: Otolaryngology

## 2023-07-26 ENCOUNTER — Ambulatory Visit (HOSPITAL_BASED_OUTPATIENT_CLINIC_OR_DEPARTMENT_OTHER): Payer: Medicare Other | Admitting: Anesthesiology

## 2023-07-26 ENCOUNTER — Ambulatory Visit (HOSPITAL_COMMUNITY): Payer: Self-pay | Admitting: Anesthesiology

## 2023-07-26 ENCOUNTER — Encounter (HOSPITAL_COMMUNITY)
Admission: RE | Disposition: A | Discharge status: Home / Self Care | Payer: Self-pay | Source: Home / Self Care | Attending: Otolaryngology

## 2023-07-26 DIAGNOSIS — Z79899 Other long term (current) drug therapy: Secondary | ICD-10-CM | POA: Diagnosis not present

## 2023-07-26 DIAGNOSIS — J330 Polyp of nasal cavity: Secondary | ICD-10-CM | POA: Insufficient documentation

## 2023-07-26 DIAGNOSIS — I10 Essential (primary) hypertension: Secondary | ICD-10-CM | POA: Insufficient documentation

## 2023-07-26 DIAGNOSIS — R220 Localized swelling, mass and lump, head: Secondary | ICD-10-CM | POA: Diagnosis present

## 2023-07-26 DIAGNOSIS — J3489 Other specified disorders of nose and nasal sinuses: Secondary | ICD-10-CM

## 2023-07-26 DIAGNOSIS — J339 Nasal polyp, unspecified: Secondary | ICD-10-CM | POA: Diagnosis not present

## 2023-07-26 HISTORY — PX: NASAL ENDOSCOPY: SHX6577

## 2023-07-26 LAB — BASIC METABOLIC PANEL
Anion gap: 10 (ref 5–15)
BUN: 22 mg/dL (ref 8–23)
CO2: 26 mmol/L (ref 22–32)
Calcium: 8.9 mg/dL (ref 8.9–10.3)
Chloride: 103 mmol/L (ref 98–111)
Creatinine, Ser: 0.68 mg/dL (ref 0.44–1.00)
GFR, Estimated: >60 mL/min (ref >60)
Glucose, Bld: 91 mg/dL (ref 70–99)
Potassium: 3.5 mmol/L (ref 3.5–5.1)
Sodium: 139 mmol/L (ref 135–145)

## 2023-07-26 LAB — CBC
HCT: 39.9 % (ref 36.0–46.0)
Hemoglobin: 13.8 g/dL (ref 12.0–15.0)
MCH: 34.3 pg — ABNORMAL HIGH (ref 26.0–34.0)
MCHC: 34.6 g/dL (ref 30.0–36.0)
MCV: 99.3 fL (ref 80.0–100.0)
Platelets: 213 10*3/uL (ref 150–400)
RBC: 4.02 MIL/uL (ref 3.87–5.11)
RDW: 12.5 % (ref 11.5–15.5)
WBC: 4.5 10*3/uL (ref 4.0–10.5)
nRBC: 0 % (ref 0.0–0.2)

## 2023-07-26 SURGERY — ENDOSCOPY, NOSE
Anesthesia: General | Site: Nose | Laterality: Left

## 2023-07-26 MED ORDER — EPINEPHRINE HCL (NASAL) 0.1 % NA SOLN
NASAL | Status: DC | PRN
  Administered 2023-07-26: 10 mL via NASAL

## 2023-07-26 MED ORDER — KETOROLAC TROMETHAMINE 0.5 % OP SOLN
OPHTHALMIC | Status: AC
  Filled 2023-07-26: qty 5

## 2023-07-26 MED ORDER — ACETAMINOPHEN 500 MG PO TABS
1000.0000 mg | ORAL_TABLET | Freq: Once | ORAL | Status: AC
  Administered 2023-07-26: 1000 mg via ORAL
  Filled 2023-07-26: qty 2

## 2023-07-26 MED ORDER — CHLORHEXIDINE GLUCONATE 0.12 % MT SOLN
15.0000 mL | Freq: Once | OROMUCOSAL | Status: AC
Start: 2023-07-26 — End: 2023-07-26
  Administered 2023-07-26: 15 mL via OROMUCOSAL
  Filled 2023-07-26: qty 15

## 2023-07-26 MED ORDER — OXYMETAZOLINE HCL 0.05 % NA SOLN
NASAL | Status: AC
  Filled 2023-07-26: qty 30

## 2023-07-26 MED ORDER — BSS IO SOLN
INTRAOCULAR | Status: AC
  Filled 2023-07-26: qty 15

## 2023-07-26 MED ORDER — 0.9 % SODIUM CHLORIDE (POUR BTL) OPTIME
TOPICAL | Status: DC | PRN
  Administered 2023-07-26: 1000 mL

## 2023-07-26 MED ORDER — PROPOFOL 500 MG/50ML IV EMUL
INTRAVENOUS | Status: DC | PRN
  Administered 2023-07-26: 100 ug/kg/min via INTRAVENOUS

## 2023-07-26 MED ORDER — SUGAMMADEX SODIUM 200 MG/2ML IV SOLN
INTRAVENOUS | Status: DC | PRN
  Administered 2023-07-26: 150 mg via INTRAVENOUS

## 2023-07-26 MED ORDER — FENTANYL CITRATE (PF) 250 MCG/5ML IJ SOLN
INTRAMUSCULAR | Status: DC | PRN
  Administered 2023-07-26: 25 ug via INTRAVENOUS
  Administered 2023-07-26: 50 ug via INTRAVENOUS

## 2023-07-26 MED ORDER — ACETAMINOPHEN 325 MG PO TABS: 325.0000 mg | ORAL_TABLET | ORAL | Status: DC | PRN

## 2023-07-26 MED ORDER — OXYCODONE HCL 5 MG PO TABS: 5.0000 mg | ORAL_TABLET | Freq: Once | ORAL | Status: DC | PRN

## 2023-07-26 MED ORDER — OXYMETAZOLINE HCL 0.05 % NA SOLN
1.0000 | Freq: Two times a day (BID) | NASAL | Status: DC
  Filled 2023-07-26: qty 30

## 2023-07-26 MED ORDER — ONDANSETRON HCL 4 MG/2ML IJ SOLN: 4.0000 mg | Freq: Once | INTRAMUSCULAR | Status: DC | PRN

## 2023-07-26 MED ORDER — LIDOCAINE-EPINEPHRINE 1 %-1:100000 IJ SOLN
INTRAMUSCULAR | Status: DC | PRN
  Administered 2023-07-26: 1.5 mL

## 2023-07-26 MED ORDER — LIDOCAINE 2% (20 MG/ML) 5 ML SYRINGE
INTRAMUSCULAR | Status: DC | PRN
  Administered 2023-07-26: 100 mg via INTRAVENOUS

## 2023-07-26 MED ORDER — MEPERIDINE HCL 25 MG/ML IJ SOLN: 6.2500 mg | INTRAMUSCULAR | Status: DC | PRN

## 2023-07-26 MED ORDER — FENTANYL CITRATE (PF) 100 MCG/2ML IJ SOLN: 25.0000 ug | INTRAMUSCULAR | Status: DC | PRN

## 2023-07-26 MED ORDER — ORAL CARE MOUTH RINSE: 15.0000 mL | Freq: Once | OROMUCOSAL | Status: AC

## 2023-07-26 MED ORDER — DEXAMETHASONE SODIUM PHOSPHATE 10 MG/ML IJ SOLN
INTRAMUSCULAR | Status: DC | PRN
  Administered 2023-07-26: 10 mg via INTRAVENOUS

## 2023-07-26 MED ORDER — EPINEPHRINE HCL (NASAL) 0.1 % NA SOLN
NASAL | Status: AC
  Filled 2023-07-26: qty 30

## 2023-07-26 MED ORDER — CELECOXIB 200 MG PO CAPS
200.0000 mg | ORAL_CAPSULE | Freq: Once | ORAL | Status: AC
  Administered 2023-07-26: 200 mg via ORAL
  Filled 2023-07-26: qty 1

## 2023-07-26 MED ORDER — LACTATED RINGERS IV SOLN: INTRAVENOUS | Status: DC

## 2023-07-26 MED ORDER — SODIUM CHLORIDE 0.9 % IV SOLN: INTRAVENOUS | Status: DC | PRN

## 2023-07-26 MED ORDER — PROPOFOL 10 MG/ML IV BOLUS
INTRAVENOUS | Status: DC | PRN
  Administered 2023-07-26: 100 mg via INTRAVENOUS

## 2023-07-26 MED ORDER — ROCURONIUM BROMIDE 10 MG/ML (PF) SYRINGE
PREFILLED_SYRINGE | INTRAVENOUS | Status: DC | PRN
  Administered 2023-07-26: 30 mg via INTRAVENOUS

## 2023-07-26 MED ORDER — ACETAMINOPHEN 160 MG/5ML PO SOLN: 325.0000 mg | ORAL | Status: DC | PRN

## 2023-07-26 MED ORDER — BSS IO SOLN
15.0000 mL | Freq: Once | INTRAOCULAR | Status: AC
Start: 2023-07-26 — End: 2023-07-26
  Administered 2023-07-26: 15 mL

## 2023-07-26 MED ORDER — FENTANYL CITRATE (PF) 250 MCG/5ML IJ SOLN
INTRAMUSCULAR | Status: AC
  Filled 2023-07-26: qty 5

## 2023-07-26 MED ORDER — PHENYLEPHRINE HCL-NACL 20-0.9 MG/250ML-% IV SOLN
INTRAVENOUS | Status: AC
  Filled 2023-07-26: qty 250

## 2023-07-26 MED ORDER — PROPOFOL 10 MG/ML IV BOLUS
INTRAVENOUS | Status: AC
  Filled 2023-07-26: qty 20

## 2023-07-26 MED ORDER — ONDANSETRON HCL 4 MG/2ML IJ SOLN
INTRAMUSCULAR | Status: DC | PRN
  Administered 2023-07-26: 4 mg via INTRAVENOUS

## 2023-07-26 MED ORDER — KETOROLAC TROMETHAMINE 0.5 % OP SOLN
1.0000 [drp] | Freq: Four times a day (QID) | OPHTHALMIC | Status: DC
  Administered 2023-07-26: 1 [drp] via OPHTHALMIC

## 2023-07-26 MED ORDER — LIDOCAINE-EPINEPHRINE 1 %-1:100000 IJ SOLN
INTRAMUSCULAR | Status: AC
  Filled 2023-07-26: qty 1

## 2023-07-26 MED ORDER — OXYCODONE HCL 5 MG/5ML PO SOLN: 5.0000 mg | Freq: Once | ORAL | Status: DC | PRN

## 2023-07-26 SURGICAL SUPPLY — 49 items
APL SRG 38 LTWT LNG FL B (MISCELLANEOUS) ×1
APPLICATOR ARISTA FLEXITIP XL (MISCELLANEOUS) ×1 IMPLANT
BAG COUNTER SPONGE SURGICOUNT (BAG) ×1 IMPLANT
BAG SPNG CNTER NS LX DISP (BAG) ×1
BLADE NAVIG QUADCUT 4.3X13 M4 (BLADE) ×1 IMPLANT
BLADE RAD40 ROTATE 4M 4 5PK (BLADE) IMPLANT
BLADE RAD60 ROTATE M4 4 5PK (BLADE) IMPLANT
CANISTER SUC SOCK COL 7 IN (MISCELLANEOUS) IMPLANT
CANISTER SUCT 3000ML PPV (MISCELLANEOUS) ×1 IMPLANT
COAGULATOR SUCT SWTCH 10FR 6 (ELECTROSURGICAL) IMPLANT
COVER LIGHT HANDLE STERIS (MISCELLANEOUS) IMPLANT
DRAPE HALF SHEET 40X57 (DRAPES) IMPLANT
DRESSING MEROCEL 8CM (GAUZE/BANDAGES/DRESSINGS) IMPLANT
DRESSING NASAL POPE 10X1.5X2.5 (GAUZE/BANDAGES/DRESSINGS) IMPLANT
DRSG MEROCEL 8CM (GAUZE/BANDAGES/DRESSINGS)
DRSG NASAL POPE 10X1.5X2.5 (GAUZE/BANDAGES/DRESSINGS)
DRSG NASOPORE 8CM (GAUZE/BANDAGES/DRESSINGS) IMPLANT
DRSG TELFA 3X8 NADH STRL (GAUZE/BANDAGES/DRESSINGS) IMPLANT
ELECT REM PT RETURN 9FT ADLT (ELECTROSURGICAL) ×1
ELECTRODE REM PT RTRN 9FT ADLT (ELECTROSURGICAL) IMPLANT
GAUZE SPONGE 2X2 STRL 8-PLY (GAUZE/BANDAGES/DRESSINGS) IMPLANT
GLOVE BIO SURGEON STRL SZ 6.5 (GLOVE) ×1 IMPLANT
GLOVE BIO SURGEON STRL SZ7.5 (GLOVE) ×1 IMPLANT
GOWN STRL REUS W/ TWL LRG LVL3 (GOWN DISPOSABLE) ×2 IMPLANT
GOWN STRL REUS W/TWL LRG LVL3 (GOWN DISPOSABLE) ×2
HEMOSTAT ARISTA ABSORB 3G PWDR (HEMOSTASIS) ×1 IMPLANT
KIT BASIN OR (CUSTOM PROCEDURE TRAY) ×1 IMPLANT
KIT TURNOVER KIT B (KITS) ×1 IMPLANT
NDL HYPO 25GX1X1/2 BEV (NEEDLE) IMPLANT
NDL PRECISIONGLIDE 27X1.5 (NEEDLE) ×1 IMPLANT
NDL SPNL 25GX3.5 QUINCKE BL (NEEDLE) ×1 IMPLANT
NEEDLE HYPO 25GX1X1/2 BEV (NEEDLE) IMPLANT
NEEDLE PRECISIONGLIDE 27X1.5 (NEEDLE) ×1 IMPLANT
NEEDLE SPNL 25GX3.5 QUINCKE BL (NEEDLE) ×1 IMPLANT
NS IRRIG 1000ML POUR BTL (IV SOLUTION) ×1 IMPLANT
PAD ARMBOARD 7.5X6 YLW CONV (MISCELLANEOUS) ×2 IMPLANT
PATTIES SURGICAL .5 X3 (DISPOSABLE) ×1 IMPLANT
POSITIONER HEAD DONUT 9IN (MISCELLANEOUS) IMPLANT
SHEATH ENDOSCRUB 0 DEG (SHEATH) IMPLANT
SHEATH ENDOSCRUB 30 DEG (SHEATH) IMPLANT
SHEATH ENDOSCRUB 45 DEG (SHEATH) IMPLANT
SOL ANTI FOG 6CC (MISCELLANEOUS) ×1 IMPLANT
SUT ETHILON 3 0 PS 1 (SUTURE) IMPLANT
SWAB COLLECTION DEVICE MRSA (MISCELLANEOUS) IMPLANT
SYR 50ML SLIP (SYRINGE) IMPLANT
TOWEL GREEN STERILE FF (TOWEL DISPOSABLE) ×1 IMPLANT
TRAY ENT MC OR (CUSTOM PROCEDURE TRAY) ×1 IMPLANT
TUBE CONNECTING 12X1/4 (SUCTIONS) ×1 IMPLANT
WATER STERILE IRR 1000ML POUR (IV SOLUTION) ×1 IMPLANT

## 2023-07-26 NOTE — Discharge Instructions (Addendum)
[facility] ENT Post Operative Instructions   Effects of Anesthesia Nasal endoscopy with biopsy involves a brief anesthesia, typically 20-30 minutes. Patients may be quite irritable for several hours after surgery. If  sedatives were given, some patients will remain sleepy for much of the  day. Nausea and vomiting is occasionally seen, and usually resolves by  the evening of surgery - even without additional medications.  Medications:  Most patients do not need pain medications after this surgery,  however you may use regular Tylenol or Motrin if you are having pain  Please begin using nasal saline spray starting the evening of surgery. This should be applied to both nostrils several times a day until your follow up visit.  Other effects of surgery:  Low-grade fever may occur. Tylenol (either oral or  suppository) can be used. If you have a fever greater  than 101.98F for several days that doesn't respond to Tylenol,  call the doctor's office.  No strenuous activity for 2 weeks, most patients are able to return to work the day after surgery as long as their job does not involve heavy lifting.  Bloody drainage from the nose or blood tinged nasal  discharge can occur after surgery and is normal. If needed, Afrin on a cotton ball can be placed in the nostrils and pressure applied to help stop bleeding. If bleeding persists and is severe, go to the ER after hours, or call the office during regular business hours.   Sneeze with your mouth open.   NO NOSE BLOWING UNTIL CLEARED BY YOUR PHYSICIAN   For left eye pain.  Toradol eye drop. 1 drop to left eye every 6 hours for 24 hours. First dose in PACU at 9am.

## 2023-07-26 NOTE — Anesthesia Procedure Notes (Signed)
Procedure Name: Intubation Date/Time: 07/26/2023 7:45 AM  Performed by: Earlene Plater, CRNAPre-anesthesia Checklist: Patient identified, Emergency Drugs available, Suction available, Patient being monitored and Timeout performed Patient Re-evaluated:Patient Re-evaluated prior to induction Oxygen Delivery Method: Circle system utilized Preoxygenation: Pre-oxygenation with 100% oxygen Induction Type: IV induction Ventilation: Mask ventilation without difficulty Laryngoscope Size: Mac and 3 Grade View: Grade I Tube type: Oral Tube size: 7.0 mm Number of attempts: 1 Airway Equipment and Method: Patient positioned with wedge pillow, Stylet and Bite block Placement Confirmation: positive ETCO2, CO2 detector, breath sounds checked- equal and bilateral and ETT inserted through vocal cords under direct vision Secured at: 22 (@lips ; Confirmed by Dr. Miguel Rota) cm Tube secured with: Tape Dental Injury: Teeth and Oropharynx as per pre-operative assessment

## 2023-07-26 NOTE — Op Note (Signed)
OPERATIVE NOTE  Patricia Long Date/Time of Admission: 07/26/2023  5:21 AM  CSN: [id number];MRN:[id number] Attending Provider: Cheron Schaumann A, DO Room/Bed: MCPO/NONE DOB: 07-29-1951 Age: 72 y.o.   Pre-Op Diagnosis: Nasal cavity mass  Post-Op Diagnosis: Nasal cavity mass  Procedure: Procedure(s): NASAL ENDOSCOPY WITH BIOPSY  Anesthesia: General  Surgeon(s): Nikolette Reindl A Sopheap Basic, DO  Staff: Circulator: Pietro Cassis, RN Scrub Person: Madilyn Fireman, Amy E  Implants: * No implants in log *  Specimens: ID Type Source Tests Collected by Time Destination  1 : Left Nasal Cavity Tissue PATH Sinus Contents/Nasal Polyps SURGICAL PATHOLOGY Kaylan Friedmann A, DO 07/26/2023 0759     Complications: None  EBL: <2 ML  Condition: stable  Operative Findings:  Exophytic lesion of the left nasal cavity, pedicled on the nasal septum and extending into the nasopharynx. No other mucosal abnormalities noted.  Description of Operation: Once operative consent was obtained and the site and surgery were confirmed with the patient and the operating room team, the patient was brought back to the operating room and general endotracheal anesthesia was obtained. The patient was turned over to the ENT service.  Adrenline soaked pledgets were placed in bilateral nares. A zero degree endoscope was used to visualize the lesion, which was noted to be pedicled on the nasal septum. The lesion was sharply excised at its attachment point using endoscopic scissors and the specimen was sent to pathology as a fresh specimen. The nasal cavity and nasopharynx were copiously irrigated and suctioned free of blood. Minimal bleeding was noted which was controlled with suction cautery and application of Arista. No further bleeding was noted.  The patient was extubated in the OR and transferred to PACU in stable condition.   Laren Boom, DO [facility]  07/26/2023

## 2023-07-26 NOTE — Transfer of Care (Signed)
Immediate Anesthesia Transfer of Care Note  Patient: Patricia Long  Procedure(s) Performed: NASAL ENDOSCOPY WITH BIOPSY (Left: Nose)  Patient Location: PACU  Anesthesia Type:General  Level of Consciousness: awake, alert , oriented, patient cooperative, and responds to stimulation  Airway & Oxygen Therapy: Patient Spontanous Breathing and Patient connected to face mask oxygen  Post-op Assessment: Report given to RN and Post -op Vital signs reviewed and stable  Post vital signs: Reviewed and stable  Last Vitals:  Vitals Value Taken Time  BP 132/70 07/26/23 0819  Temp 36.2 C 07/26/23 0819  Pulse 53 07/26/23 0829  Resp 10 07/26/23 0829  SpO2 93 % 07/26/23 0829  Vitals shown include unfiled device data.  Last Pain:  Vitals:   07/26/23 0819  TempSrc:   PainSc: 0-No pain        Complications: No notable events documented.

## 2023-07-26 NOTE — H&P (Signed)
Patricia Long is an 72 y.o. female.    Chief Complaint:  Mass of nasal cavity  HPI: Patient presents today for planned elective procedure.  She denies any interval change in history since office visit on 07/17/2023:  Patricia Long is a 72 y.o. female who presents as a new consult, referred by Stephannie Li, MD, for evaluation and treatment of incidental finding on recent MRI. Patient states that imaging was initially obtained because of pain in her left eye as well as headaches. After normal exam with her optometrist, she eventually sought consultation with ophthalmology, who recommended MRI imaging. MRI of the orbits demonstrated a mass lesion in the left nasal cavity extending to the nasopharynx, prompting ENT referral. Patient denies history of daytime nasal obstruction, epistaxis. No history of nasal trauma or nasal surgery. She denies unintentional weight loss, dysphagia, odynophagia. She states that she occasionally drinks alcohol, but does not drink routinely. She denies tobacco use or recreational drug use. Patient follows with rheumatology for rheumatoid arthritis and is on immunologic medications, including methotrexate   Past Medical History:  Diagnosis Date   Arthritis    RA   Basal cell carcinoma ~ 2013   "legs"   Cancer (HCC)    right breast cancer   Cataract    Chronic bronchitis (HCC)    "I've had it several times but not q yr" (01/13/2015)   Endometriosis    Fibroid    Hypertension    Irritable bowel syndrome    Pneumonia 1970's X 1; ~ 2013   PONV (postoperative nausea and vomiting)    Rheumatoid arthritis(714.0)     Past Surgical History:  Procedure Laterality Date   BASAL CELL CARCINOMA EXCISION  ~ 2013   "legs"   BREAST CYST EXCISION Bilateral 1992   NODULE EXCISION OF RIGHT AND LEFT BREAST   BREAST SURGERY Right    mastectomy and reconstruction 2020   COLONOSCOPY     EYE SURGERY Bilateral    cataracts   FOOT SURGERY Bilateral ~ 2006-2008    "reconstruction"   JOINT REPLACEMENT Left 2009   WRIST AND FINGER    KNEE ARTHROSCOPY Right 1980's   PELVIC LAPAROSCOPY  '82 AND '87   TOTAL ABDOMINAL HYSTERECTOMY  1997   TAH/BSO    Family History  Problem Relation Age of Onset   Heart disease Mother    Heart attack Mother    Heart disease Father    Heart attack Father    Colon cancer Maternal Aunt    Colon cancer Paternal Uncle    Breast cancer Maternal Aunt 56   Colon polyps Neg Hx    Diabetes Neg Hx    Kidney disease Neg Hx    Esophageal cancer Neg Hx    Stomach cancer Neg Hx    Rectal cancer Neg Hx     Social History:  reports that she has never smoked. She has never used smokeless tobacco. She reports current alcohol use of about 1.0 standard drink of alcohol per week. She reports that she does not use drugs.  Allergies:  Allergies  Allergen Reactions   Codeine Nausea Only and Rash    Other reaction(s): rash   Prochlorperazine Rash    Other reaction(s): Seizure, Unknown   Adalimumab     Other Reaction(s): had to change due cost   Crestor [Rosuvastatin]     myalgias   Etanercept     Other reaction(s): lost efficacy   Lipitor [Atorvastatin]     Myalgias, brain fog  Prochlorperazine Edisylate Other (See Comments)    Compazine, "muscular" from base of spine to skull jerked forward, major spasma   Prochlorperazine Edisylate     Other reaction(s): Other (See Comments) compazine   Tape Rash    Plastic tape, not paper tape    Medications Prior to Admission  Medication Sig Dispense Refill   amLODipine (NORVASC) 5 MG tablet Take 5 mg by mouth daily.     diclofenac (VOLTAREN) 75 MG EC tablet Take 75 mg by mouth daily as needed for mild pain (pain score 1-3).     Evolocumab (REPATHA SURECLICK) 140 MG/ML SOAJ Inject 140 mg into the skin every 14 (fourteen) days. 6 mL 3   folic acid (FOLVITE) 1 MG tablet Take 1 mg by mouth daily.     gabapentin (NEURONTIN) 300 MG capsule Take 1 capsule (300 mg total) by mouth at  bedtime. (Patient taking differently: Take 600 mg by mouth at bedtime.) 90 capsule 4   hydroxychloroquine (PLAQUENIL) 200 MG tablet Take 200 mg by mouth daily.     Methotrexate Sodium (METHOTREXATE, PF,) 50 MG/2ML injection Inject 0.8 mLs into the muscle once a week.     pramipexole (MIRAPEX) 0.125 MG tablet Take 0.125 mg by mouth at bedtime.     traZODone (DESYREL) 50 MG tablet Take 25 mg by mouth at bedtime.     tretinoin (RETIN-A) 0.025 % cream Apply 1 application  topically at bedtime as needed (Face).     valsartan (DIOVAN) 80 MG tablet Take 80 mg by mouth daily.     acetaminophen (TYLENOL) 500 MG tablet Take 500 mg by mouth daily as needed.     cetirizine (ZYRTEC) 10 MG tablet Take 10 mg by mouth daily as needed (Wiith remicaide infusion).     inFLIXimab (REMICADE) 100 MG injection Inject 100 mg into the vein every 6 (six) weeks.     Multiple Vitamin (MULTIVITAMIN) tablet Take 1 tablet by mouth daily.     valACYclovir (VALTREX) 1000 MG tablet Take 1,000 mg by mouth daily as needed.      Results for orders placed or performed during the hospital encounter of 07/26/23 (from the past 48 hour(s))  Basic metabolic panel per protocol     Status: None   Collection Time: 07/26/23  6:06 AM  Result Value Ref Range   Sodium 139 135 - 145 mmol/L   Potassium 3.5 3.5 - 5.1 mmol/L   Chloride 103 98 - 111 mmol/L   CO2 26 22 - 32 mmol/L   Glucose, Bld 91 70 - 99 mg/dL    Comment: Glucose reference range applies only to samples taken after fasting for at least 8 hours.   BUN 22 8 - 23 mg/dL   Creatinine, Ser 2.95 0.44 - 1.00 mg/dL   Calcium 8.9 8.9 - 62.1 mg/dL   GFR, Estimated >30 >86 mL/min    Comment: (NOTE) Calculated using the CKD-EPI Creatinine Equation (2021)    Anion gap 10 5 - 15    Comment: Performed at [facility], [address] [place], [place] [address]  CBC per protocol     Status: Abnormal   Collection Time: 07/26/23  6:06 AM  Result Value Ref Range   WBC 4.5 4.0 -  10.5 K/uL   RBC 4.02 3.87 - 5.11 MIL/uL   Hemoglobin 13.8 12.0 - 15.0 g/dL   HCT 96.2 95.2 - 84.1 %   MCV 99.3 80.0 - 100.0 fL   MCH 34.3 (H) 26.0 - 34.0  pg   MCHC 34.6 30.0 - 36.0 g/dL   RDW 16.1 09.6 - 04.5 %   Platelets 213 150 - 400 K/uL   nRBC 0.0 0.0 - 0.2 %    Comment: Performed at [facility], [address] [place], [place] [address]   No results found.  ROS: ROS  Blood pressure 132/69, pulse (!) 59, temperature 97.8 F (36.6 C), temperature source Oral, resp. rate 18, height 5\' 8"  (1.727 m), weight 61.2 kg, SpO2 95%.  PHYSICAL EXAM: Physical Exam Constitutional:      Appearance: Normal appearance.  Pulmonary:     Effort: Pulmonary effort is normal.  Neurological:     General: No focal deficit present.     Mental Status: She is alert.  Psychiatric:        Mood and Affect: Mood normal.        Behavior: Behavior normal.     Studies Reviewed: MRI   Assessment/Plan Mishka Veerkamp is a 72 y.o. female with incidental finding of left nasal cavity mass detected on recent MRI of the orbits performed for unilateral eye pain and headaches. Nasal endoscopy demonstrated an exophytic mucosal mass lesion which appears to emanate from the left septum and extend towards the nasopharynx. Remainder of examination is unremarkable. I recommend proceeding with rigid nasal endoscopy and biopsy under general anesthesia. Risks of surgery, benefits as well as expected postoperative course and recovery were reviewed with patient. All questions were answered.     Pernie Grosso A Jensine Luz 07/26/2023, 7:25 AM

## 2023-07-26 NOTE — Anesthesia Postprocedure Evaluation (Signed)
Anesthesia Post Note  Patient: MYALYNN HASBROOK  Procedure(s) Performed: NASAL ENDOSCOPY WITH BIOPSY (Left: Nose)     Patient location during evaluation: PACU Anesthesia Type: General Level of consciousness: awake and alert Pain management: pain level controlled Vital Signs Assessment: post-procedure vital signs reviewed and stable Respiratory status: spontaneous breathing, nonlabored ventilation, respiratory function stable and patient connected to nasal cannula oxygen Cardiovascular status: blood pressure returned to baseline and stable Postop Assessment: no apparent nausea or vomiting Anesthetic complications: no  No notable events documented.  Last Vitals:  Vitals:   07/26/23 0900 07/26/23 0915  BP: (!) 108/58 120/62  Pulse: (!) 54 (!) 54  Resp: 11 11  Temp:  36.6 C  SpO2: 92% 92%    Last Pain:  Vitals:   07/26/23 0915  TempSrc:   PainSc: 0-No pain                 Mikale Silversmith

## 2023-07-27 ENCOUNTER — Encounter (HOSPITAL_COMMUNITY): Payer: Self-pay | Admitting: Otolaryngology

## 2023-07-27 LAB — SURGICAL PATHOLOGY

## 2023-08-02 DIAGNOSIS — H5712 Ocular pain, left eye: Secondary | ICD-10-CM | POA: Diagnosis not present

## 2023-08-02 DIAGNOSIS — J330 Polyp of nasal cavity: Secondary | ICD-10-CM | POA: Diagnosis not present

## 2023-08-07 DIAGNOSIS — M0589 Other rheumatoid arthritis with rheumatoid factor of multiple sites: Secondary | ICD-10-CM | POA: Diagnosis not present

## 2023-08-08 DIAGNOSIS — M1991 Primary osteoarthritis, unspecified site: Secondary | ICD-10-CM | POA: Diagnosis not present

## 2023-08-08 DIAGNOSIS — Z681 Body mass index (BMI) 19 or less, adult: Secondary | ICD-10-CM | POA: Diagnosis not present

## 2023-08-08 DIAGNOSIS — D329 Benign neoplasm of meninges, unspecified: Secondary | ICD-10-CM | POA: Diagnosis not present

## 2023-08-08 DIAGNOSIS — Z79899 Other long term (current) drug therapy: Secondary | ICD-10-CM | POA: Diagnosis not present

## 2023-08-08 DIAGNOSIS — J3489 Other specified disorders of nose and nasal sinuses: Secondary | ICD-10-CM | POA: Diagnosis not present

## 2023-08-08 DIAGNOSIS — M0589 Other rheumatoid arthritis with rheumatoid factor of multiple sites: Secondary | ICD-10-CM | POA: Diagnosis not present

## 2023-08-14 DIAGNOSIS — M07672 Enteropathic arthropathies, left ankle and foot: Secondary | ICD-10-CM | POA: Diagnosis not present

## 2023-08-14 DIAGNOSIS — G6289 Other specified polyneuropathies: Secondary | ICD-10-CM | POA: Diagnosis not present

## 2023-08-14 DIAGNOSIS — H5712 Ocular pain, left eye: Secondary | ICD-10-CM | POA: Diagnosis not present

## 2023-08-14 DIAGNOSIS — G2581 Restless legs syndrome: Secondary | ICD-10-CM | POA: Diagnosis not present

## 2023-09-10 DIAGNOSIS — M0540 Rheumatoid myopathy with rheumatoid arthritis of unspecified site: Secondary | ICD-10-CM | POA: Diagnosis not present

## 2023-09-10 DIAGNOSIS — J339 Nasal polyp, unspecified: Secondary | ICD-10-CM | POA: Diagnosis not present

## 2023-09-10 DIAGNOSIS — I10 Essential (primary) hypertension: Secondary | ICD-10-CM | POA: Diagnosis not present

## 2023-09-10 DIAGNOSIS — H5712 Ocular pain, left eye: Secondary | ICD-10-CM | POA: Diagnosis not present

## 2023-09-10 DIAGNOSIS — R9089 Other abnormal findings on diagnostic imaging of central nervous system: Secondary | ICD-10-CM | POA: Diagnosis not present

## 2023-09-10 DIAGNOSIS — G629 Polyneuropathy, unspecified: Secondary | ICD-10-CM | POA: Diagnosis not present

## 2023-10-02 DIAGNOSIS — Z79899 Other long term (current) drug therapy: Secondary | ICD-10-CM | POA: Diagnosis not present

## 2023-10-02 DIAGNOSIS — Z111 Encounter for screening for respiratory tuberculosis: Secondary | ICD-10-CM | POA: Diagnosis not present

## 2023-10-02 DIAGNOSIS — R5383 Other fatigue: Secondary | ICD-10-CM | POA: Diagnosis not present

## 2023-10-02 DIAGNOSIS — M0589 Other rheumatoid arthritis with rheumatoid factor of multiple sites: Secondary | ICD-10-CM | POA: Diagnosis not present

## 2023-11-14 ENCOUNTER — Telehealth (INDEPENDENT_AMBULATORY_CARE_PROVIDER_SITE_OTHER): Payer: Self-pay | Admitting: Otolaryngology

## 2023-11-14 NOTE — Telephone Encounter (Signed)
 Confirmed appt and address with patient for 11/15/2023.

## 2023-11-15 ENCOUNTER — Institutional Professional Consult (permissible substitution) (INDEPENDENT_AMBULATORY_CARE_PROVIDER_SITE_OTHER): Payer: Medicare Other

## 2023-11-19 DIAGNOSIS — M0589 Other rheumatoid arthritis with rheumatoid factor of multiple sites: Secondary | ICD-10-CM | POA: Diagnosis not present

## 2023-11-19 DIAGNOSIS — Z79899 Other long term (current) drug therapy: Secondary | ICD-10-CM | POA: Diagnosis not present

## 2023-12-04 ENCOUNTER — Telehealth (INDEPENDENT_AMBULATORY_CARE_PROVIDER_SITE_OTHER): Payer: Self-pay | Admitting: Otolaryngology

## 2023-12-04 DIAGNOSIS — I700 Atherosclerosis of aorta: Secondary | ICD-10-CM | POA: Diagnosis not present

## 2023-12-04 DIAGNOSIS — I10 Essential (primary) hypertension: Secondary | ICD-10-CM | POA: Diagnosis not present

## 2023-12-04 NOTE — Telephone Encounter (Signed)
 Confirmed appt & location [id number] afm

## 2023-12-05 ENCOUNTER — Ambulatory Visit (INDEPENDENT_AMBULATORY_CARE_PROVIDER_SITE_OTHER): Payer: Medicare Other

## 2023-12-05 ENCOUNTER — Ambulatory Visit
Admission: RE | Admit: 2023-12-05 | Discharge: 2023-12-05 | Disposition: A | Discharge status: Home / Self Care | Payer: Medicare Other | Source: Ambulatory Visit | Attending: Student

## 2023-12-05 ENCOUNTER — Encounter (INDEPENDENT_AMBULATORY_CARE_PROVIDER_SITE_OTHER): Payer: Self-pay

## 2023-12-05 VITALS — BP 147/73 | HR 78 | Ht 68.0 in | Wt 135.0 lb

## 2023-12-05 DIAGNOSIS — I700 Atherosclerosis of aorta: Secondary | ICD-10-CM | POA: Diagnosis not present

## 2023-12-05 DIAGNOSIS — J479 Bronchiectasis, uncomplicated: Secondary | ICD-10-CM | POA: Diagnosis not present

## 2023-12-05 DIAGNOSIS — I2510 Atherosclerotic heart disease of native coronary artery without angina pectoris: Secondary | ICD-10-CM | POA: Diagnosis not present

## 2023-12-05 DIAGNOSIS — R0981 Nasal congestion: Secondary | ICD-10-CM | POA: Diagnosis not present

## 2023-12-05 DIAGNOSIS — J338 Other polyp of sinus: Secondary | ICD-10-CM

## 2023-12-05 DIAGNOSIS — E041 Nontoxic single thyroid nodule: Secondary | ICD-10-CM | POA: Diagnosis not present

## 2023-12-05 DIAGNOSIS — J310 Chronic rhinitis: Secondary | ICD-10-CM

## 2023-12-05 DIAGNOSIS — R053 Chronic cough: Secondary | ICD-10-CM

## 2023-12-06 DIAGNOSIS — J338 Other polyp of sinus: Secondary | ICD-10-CM | POA: Insufficient documentation

## 2023-12-06 DIAGNOSIS — J310 Chronic rhinitis: Secondary | ICD-10-CM | POA: Insufficient documentation

## 2023-12-06 NOTE — Progress Notes (Signed)
 Patient ID: Patricia Long, female   DOB: 1951-08-27, 72 y.o.   MRN: [id number]  CC: Left nasal polyp  HPI:  Patricia Long is a 73 y.o. female who presents today for follow-up of her left nasal polyp.  According to the patient, her previous MRI scan showed a left nasal mass.  She was seen by Dr. Marene Lenz at [facility].  The polyp was excised.  The pathology was consistent with sinonasal polyp.  No malignancy was noted.  The patient has a history of mild environmental allergies.  She is not on any allergy medications.  Currently she reports occasional mild congestion.  She has a history of left periorbital pain.  She was evaluated by multiple eye surgeons with no significant abnormality noted.  Past Medical History:  Diagnosis Date   Arthritis    RA   Basal cell carcinoma ~ 2013   "legs"   Cancer (HCC)    right breast cancer   Cataract    Chronic bronchitis (HCC)    "I've had it several times but not q yr" (01/13/2015)   Endometriosis    Fibroid    Hypertension    Irritable bowel syndrome    Pneumonia 1970's X 1; ~ 2013   PONV (postoperative nausea and vomiting)    Rheumatoid arthritis(714.0)     Past Surgical History:  Procedure Laterality Date   BASAL CELL CARCINOMA EXCISION  ~ 2013   "legs"   BREAST CYST EXCISION Bilateral 1992   NODULE EXCISION OF RIGHT AND LEFT BREAST   BREAST SURGERY Right    mastectomy and reconstruction 2020   COLONOSCOPY     EYE SURGERY Bilateral    cataracts   FOOT SURGERY Bilateral ~ 2006-2008   "reconstruction"   JOINT REPLACEMENT Left 2009   WRIST AND FINGER    KNEE ARTHROSCOPY Right 1980's   NASAL ENDOSCOPY Left 07/26/2023   Procedure: NASAL ENDOSCOPY WITH BIOPSY;  Surgeon: Laren Boom, DO;  Location: [facility] OR;  Service: ENT;  Laterality: Left;   PELVIC LAPAROSCOPY  '82 AND '87   TOTAL ABDOMINAL HYSTERECTOMY  1997   TAH/BSO    Family History  Problem Relation Age of Onset   Heart disease Mother    Heart attack Mother     Heart disease Father    Heart attack Father    Colon cancer Maternal Aunt    Colon cancer Paternal Uncle    Breast cancer Maternal Aunt 79   Colon polyps Neg Hx    Diabetes Neg Hx    Kidney disease Neg Hx    Esophageal cancer Neg Hx    Stomach cancer Neg Hx    Rectal cancer Neg Hx     Social History:  reports that she has never smoked. She has never used smokeless tobacco. She reports current alcohol use of about 1.0 standard drink of alcohol per week. She reports that she does not use drugs.  Allergies:  Allergies  Allergen Reactions   Codeine Nausea Only and Rash    Other reaction(s): rash   Prochlorperazine Rash    Other reaction(s): Seizure, Unknown   Adalimumab     Other Reaction(s): had to change due cost   Crestor [Rosuvastatin]     myalgias   Etanercept     Other reaction(s): lost efficacy   Lipitor [Atorvastatin]     Myalgias, brain fog   Prochlorperazine Edisylate Other (See Comments)    Compazine, "muscular" from base of spine to skull jerked forward, major  spasma   Prochlorperazine Edisylate     Other reaction(s): Other (See Comments) compazine   Tape Rash    Plastic tape, not paper tape    Prior to Admission medications   Medication Sig Start Date End Date Taking? Authorizing Provider  acetaminophen (TYLENOL) 500 MG tablet Take 500 mg by mouth daily as needed.   Yes [provider]  amLODipine (NORVASC) 5 MG tablet Take 5 mg by mouth daily.   Yes [provider]  cetirizine (ZYRTEC) 10 MG tablet Take 10 mg by mouth daily as needed (Wiith remicaide infusion).   Yes [provider]  diclofenac (VOLTAREN) 75 MG EC tablet Take 75 mg by mouth daily as needed for mild pain (pain score 1-3). 05/01/23  Yes [provider]  Evolocumab (REPATHA SURECLICK) 140 MG/ML SOAJ Inject 140 mg into the skin every 14 (fourteen) days. 11/03/22  Yes Croitoru, Mihai, MD  folic acid (FOLVITE) 1 MG tablet Take 1 mg by mouth daily.   Yes [provider]  gabapentin (NEURONTIN) 300 MG capsule Take 1 capsule (300 mg total) by mouth at bedtime. Patient taking differently: Take 600 mg by mouth at bedtime. 06/17/19  Yes Magrinat, Valentino Hue, MD  hydroxychloroquine (PLAQUENIL) 200 MG tablet Take 200 mg by mouth daily.   Yes [provider]  inFLIXimab (REMICADE) 100 MG injection Inject 100 mg into the vein every 6 (six) weeks.   Yes [provider]  Methotrexate Sodium (METHOTREXATE, PF,) 50 MG/2ML injection Inject 0.8 mLs into the muscle once a week. 04/12/23  Yes [provider]  Multiple Vitamin (MULTIVITAMIN) tablet Take 1 tablet by mouth daily.   Yes [provider]  pramipexole (MIRAPEX) 0.125 MG tablet Take 0.125 mg by mouth at bedtime. 09/20/20  Yes [provider]  traZODone (DESYREL) 50 MG tablet Take 25 mg by mouth at bedtime. 08/20/19  Yes [provider]  tretinoin (RETIN-A) 0.025 % cream Apply 1 application  topically at bedtime as needed (Face). 01/04/23  Yes [provider]  valACYclovir (VALTREX) 1000 MG tablet Take 1,000 mg by mouth daily as needed. 06/12/23  Yes [provider]  valsartan (DIOVAN) 80 MG tablet Take 80 mg by mouth daily.   Yes [provider]    Blood pressure (!) 147/73, pulse 78, height 5\' 8"  (1.727 m), weight 135 lb (61.2 kg), SpO2 97%. Exam: General: Communicates without difficulty, well nourished, no acute distress. Head: Normocephalic, no evidence injury, no tenderness, facial buttresses intact without stepoff. Face/sinus: No tenderness to palpation and percussion. Facial movement is normal and symmetric. Eyes: PERRL, EOMI. No scleral icterus, conjunctivae clear. Neuro: CN II exam reveals vision grossly intact.  No nystagmus at any point of gaze. Ears: Auricles well formed without lesions.  Ear canals are intact without mass or lesion.  No erythema or edema is appreciated.  The TMs are intact without fluid. Nose: External  evaluation reveals normal support and skin without lesions.  Dorsum is intact.  Anterior rhinoscopy reveals congested mucosa over anterior aspect of inferior turbinates and intact septum.  No purulence noted. Oral:  Oral cavity and oropharynx are intact, symmetric, without erythema or edema.  Mucosa is moist without lesions. Neck: Full range of motion without pain.  There is no significant lymphadenopathy.  No masses palpable.  Thyroid bed within normal limits to palpation.  Parotid glands and submandibular glands equal bilaterally without mass.  Trachea is midline. Neuro:  CN 2-12 grossly intact.   Procedure:  Flexible Nasal  Endoscopy: Description: Risks, benefits, and alternatives of flexible endoscopy were explained to the patient.  Specific mention was made of the risk of throat numbness with difficulty swallowing, possible bleeding from the nose and mouth, and pain from the procedure.  The patient gave oral consent to proceed.  The flexible scope was inserted into the right nasal cavity.  Endoscopy of the interior nasal cavity, superior, inferior, and middle meatus was performed. The sphenoid-ethmoid recess was examined. Edematous mucosa was noted.  No polyp, mass, or lesion was appreciated. Olfactory cleft was clear.  Nasopharynx was clear.  Turbinates were normal.  The procedure was repeated on the contralateral side with similar findings.  The patient tolerated the procedure well.    Assessment: 1.  Chronic rhinitis with mild nasal mucosal congestion. 2.  No recurrent polyposis is noted on today's nasal endoscopy examination.  Plan: 1.  The physical exam and nasal endoscopy findings are reviewed with the patient. 2.  The patient is reassured that no recurrent polyposis is noted today. 3.  Flonase nasal spray as needed to treat her chronic rhinitis. 4.  The patient will return for reevaluation in 6 months.  Grae Cannata W Lilybelle Mayeda 12/06/2023, 12:54 PM

## 2023-12-12 DIAGNOSIS — M21949 Unspecified acquired deformity of hand, unspecified hand: Secondary | ICD-10-CM | POA: Diagnosis not present

## 2023-12-12 DIAGNOSIS — M13842 Other specified arthritis, left hand: Secondary | ICD-10-CM | POA: Diagnosis not present

## 2023-12-12 DIAGNOSIS — M13841 Other specified arthritis, right hand: Secondary | ICD-10-CM | POA: Diagnosis not present

## 2023-12-24 DIAGNOSIS — M0540 Rheumatoid myopathy with rheumatoid arthritis of unspecified site: Secondary | ICD-10-CM | POA: Diagnosis not present

## 2023-12-24 DIAGNOSIS — C50911 Malignant neoplasm of unspecified site of right female breast: Secondary | ICD-10-CM | POA: Diagnosis not present

## 2023-12-24 DIAGNOSIS — I700 Atherosclerosis of aorta: Secondary | ICD-10-CM | POA: Diagnosis not present

## 2023-12-24 DIAGNOSIS — E785 Hyperlipidemia, unspecified: Secondary | ICD-10-CM | POA: Diagnosis not present

## 2023-12-24 DIAGNOSIS — I10 Essential (primary) hypertension: Secondary | ICD-10-CM | POA: Diagnosis not present

## 2024-01-01 DIAGNOSIS — M0589 Other rheumatoid arthritis with rheumatoid factor of multiple sites: Secondary | ICD-10-CM | POA: Diagnosis not present

## 2024-01-02 DIAGNOSIS — I10 Essential (primary) hypertension: Secondary | ICD-10-CM | POA: Diagnosis not present

## 2024-01-02 DIAGNOSIS — I700 Atherosclerosis of aorta: Secondary | ICD-10-CM | POA: Diagnosis not present

## 2024-01-30 DIAGNOSIS — D485 Neoplasm of uncertain behavior of skin: Secondary | ICD-10-CM | POA: Diagnosis not present

## 2024-01-30 DIAGNOSIS — L820 Inflamed seborrheic keratosis: Secondary | ICD-10-CM | POA: Diagnosis not present

## 2024-01-30 DIAGNOSIS — B078 Other viral warts: Secondary | ICD-10-CM | POA: Diagnosis not present

## 2024-01-30 DIAGNOSIS — Z1283 Encounter for screening for malignant neoplasm of skin: Secondary | ICD-10-CM | POA: Diagnosis not present

## 2024-01-30 DIAGNOSIS — L821 Other seborrheic keratosis: Secondary | ICD-10-CM | POA: Diagnosis not present

## 2024-01-30 DIAGNOSIS — D225 Melanocytic nevi of trunk: Secondary | ICD-10-CM | POA: Diagnosis not present

## 2024-02-01 DIAGNOSIS — I700 Atherosclerosis of aorta: Secondary | ICD-10-CM | POA: Diagnosis not present

## 2024-02-01 DIAGNOSIS — I10 Essential (primary) hypertension: Secondary | ICD-10-CM | POA: Diagnosis not present

## 2024-02-11 DIAGNOSIS — Z79899 Other long term (current) drug therapy: Secondary | ICD-10-CM | POA: Diagnosis not present

## 2024-02-11 DIAGNOSIS — Z681 Body mass index (BMI) 19 or less, adult: Secondary | ICD-10-CM | POA: Diagnosis not present

## 2024-02-11 DIAGNOSIS — G5793 Unspecified mononeuropathy of bilateral lower limbs: Secondary | ICD-10-CM | POA: Diagnosis not present

## 2024-02-11 DIAGNOSIS — M1991 Primary osteoarthritis, unspecified site: Secondary | ICD-10-CM | POA: Diagnosis not present

## 2024-02-11 DIAGNOSIS — M0589 Other rheumatoid arthritis with rheumatoid factor of multiple sites: Secondary | ICD-10-CM | POA: Diagnosis not present

## 2024-02-11 DIAGNOSIS — M19049 Primary osteoarthritis, unspecified hand: Secondary | ICD-10-CM | POA: Diagnosis not present

## 2024-02-12 DIAGNOSIS — M0589 Other rheumatoid arthritis with rheumatoid factor of multiple sites: Secondary | ICD-10-CM | POA: Diagnosis not present

## 2024-02-20 DIAGNOSIS — B0089 Other herpesviral infection: Secondary | ICD-10-CM | POA: Diagnosis not present

## 2024-02-20 DIAGNOSIS — D485 Neoplasm of uncertain behavior of skin: Secondary | ICD-10-CM | POA: Diagnosis not present

## 2024-02-20 DIAGNOSIS — L988 Other specified disorders of the skin and subcutaneous tissue: Secondary | ICD-10-CM | POA: Diagnosis not present

## 2024-02-21 DIAGNOSIS — S66912A Strain of unspecified muscle, fascia and tendon at wrist and hand level, left hand, initial encounter: Secondary | ICD-10-CM | POA: Diagnosis not present

## 2024-02-25 DIAGNOSIS — S66912A Strain of unspecified muscle, fascia and tendon at wrist and hand level, left hand, initial encounter: Secondary | ICD-10-CM | POA: Diagnosis not present

## 2024-02-25 DIAGNOSIS — M13841 Other specified arthritis, right hand: Secondary | ICD-10-CM | POA: Diagnosis not present

## 2024-02-28 DIAGNOSIS — K219 Gastro-esophageal reflux disease without esophagitis: Secondary | ICD-10-CM | POA: Diagnosis not present

## 2024-02-28 DIAGNOSIS — I10 Essential (primary) hypertension: Secondary | ICD-10-CM | POA: Diagnosis not present

## 2024-02-28 DIAGNOSIS — M0540 Rheumatoid myopathy with rheumatoid arthritis of unspecified site: Secondary | ICD-10-CM | POA: Diagnosis not present

## 2024-02-28 DIAGNOSIS — S6992XS Unspecified injury of left wrist, hand and finger(s), sequela: Secondary | ICD-10-CM | POA: Diagnosis not present

## 2024-02-28 DIAGNOSIS — R9089 Other abnormal findings on diagnostic imaging of central nervous system: Secondary | ICD-10-CM | POA: Diagnosis not present

## 2024-02-28 DIAGNOSIS — R9389 Abnormal findings on diagnostic imaging of other specified body structures: Secondary | ICD-10-CM | POA: Diagnosis not present

## 2024-02-28 DIAGNOSIS — I2510 Atherosclerotic heart disease of native coronary artery without angina pectoris: Secondary | ICD-10-CM | POA: Diagnosis not present

## 2024-02-28 DIAGNOSIS — G629 Polyneuropathy, unspecified: Secondary | ICD-10-CM | POA: Diagnosis not present

## 2024-03-04 DIAGNOSIS — S66311A Strain of extensor muscle, fascia and tendon of left index finger at wrist and hand level, initial encounter: Secondary | ICD-10-CM | POA: Diagnosis not present

## 2024-03-04 DIAGNOSIS — S66315A Strain of extensor muscle, fascia and tendon of left ring finger at wrist and hand level, initial encounter: Secondary | ICD-10-CM | POA: Diagnosis not present

## 2024-03-04 DIAGNOSIS — M66242 Spontaneous rupture of extensor tendons, left hand: Secondary | ICD-10-CM | POA: Diagnosis not present

## 2024-03-04 DIAGNOSIS — M67834 Other specified disorders of tendon, left wrist: Secondary | ICD-10-CM | POA: Diagnosis not present

## 2024-03-04 DIAGNOSIS — M24642 Ankylosis, left hand: Secondary | ICD-10-CM | POA: Diagnosis not present

## 2024-03-04 DIAGNOSIS — L905 Scar conditions and fibrosis of skin: Secondary | ICD-10-CM | POA: Diagnosis not present

## 2024-03-04 DIAGNOSIS — T8489XA Other specified complication of internal orthopedic prosthetic devices, implants and grafts, initial encounter: Secondary | ICD-10-CM | POA: Diagnosis not present

## 2024-03-04 DIAGNOSIS — S66313A Strain of extensor muscle, fascia and tendon of left middle finger at wrist and hand level, initial encounter: Secondary | ICD-10-CM | POA: Diagnosis not present

## 2024-03-12 DIAGNOSIS — M25642 Stiffness of left hand, not elsewhere classified: Secondary | ICD-10-CM | POA: Diagnosis not present

## 2024-03-25 DIAGNOSIS — M0589 Other rheumatoid arthritis with rheumatoid factor of multiple sites: Secondary | ICD-10-CM | POA: Diagnosis not present

## 2024-04-01 DIAGNOSIS — M25642 Stiffness of left hand, not elsewhere classified: Secondary | ICD-10-CM | POA: Diagnosis not present

## 2024-04-07 DIAGNOSIS — M25642 Stiffness of left hand, not elsewhere classified: Secondary | ICD-10-CM | POA: Diagnosis not present

## 2024-04-17 DIAGNOSIS — M25642 Stiffness of left hand, not elsewhere classified: Secondary | ICD-10-CM | POA: Diagnosis not present

## 2024-04-18 DIAGNOSIS — Z9011 Acquired absence of right breast and nipple: Secondary | ICD-10-CM | POA: Diagnosis not present

## 2024-04-18 DIAGNOSIS — Z08 Encounter for follow-up examination after completed treatment for malignant neoplasm: Secondary | ICD-10-CM | POA: Diagnosis not present

## 2024-04-18 DIAGNOSIS — Z853 Personal history of malignant neoplasm of breast: Secondary | ICD-10-CM | POA: Diagnosis not present

## 2024-04-21 DIAGNOSIS — M8589 Other specified disorders of bone density and structure, multiple sites: Secondary | ICD-10-CM | POA: Diagnosis not present

## 2024-04-21 DIAGNOSIS — Z1331 Encounter for screening for depression: Secondary | ICD-10-CM | POA: Diagnosis not present

## 2024-04-21 DIAGNOSIS — E785 Hyperlipidemia, unspecified: Secondary | ICD-10-CM | POA: Diagnosis not present

## 2024-04-21 DIAGNOSIS — Z23 Encounter for immunization: Secondary | ICD-10-CM | POA: Diagnosis not present

## 2024-04-21 DIAGNOSIS — Z0000 Encounter for general adult medical examination without abnormal findings: Secondary | ICD-10-CM | POA: Diagnosis not present

## 2024-04-21 DIAGNOSIS — M8580 Other specified disorders of bone density and structure, unspecified site: Secondary | ICD-10-CM | POA: Diagnosis not present

## 2024-04-21 DIAGNOSIS — G629 Polyneuropathy, unspecified: Secondary | ICD-10-CM | POA: Diagnosis not present

## 2024-04-21 DIAGNOSIS — M0540 Rheumatoid myopathy with rheumatoid arthritis of unspecified site: Secondary | ICD-10-CM | POA: Diagnosis not present

## 2024-04-21 DIAGNOSIS — I10 Essential (primary) hypertension: Secondary | ICD-10-CM | POA: Diagnosis not present

## 2024-04-21 DIAGNOSIS — K219 Gastro-esophageal reflux disease without esophagitis: Secondary | ICD-10-CM | POA: Diagnosis not present

## 2024-04-21 DIAGNOSIS — Z79899 Other long term (current) drug therapy: Secondary | ICD-10-CM | POA: Diagnosis not present

## 2024-04-21 DIAGNOSIS — C50911 Malignant neoplasm of unspecified site of right female breast: Secondary | ICD-10-CM | POA: Diagnosis not present

## 2024-04-21 DIAGNOSIS — R9389 Abnormal findings on diagnostic imaging of other specified body structures: Secondary | ICD-10-CM | POA: Diagnosis not present

## 2024-04-21 DIAGNOSIS — G2581 Restless legs syndrome: Secondary | ICD-10-CM | POA: Diagnosis not present

## 2024-04-21 DIAGNOSIS — I2510 Atherosclerotic heart disease of native coronary artery without angina pectoris: Secondary | ICD-10-CM | POA: Diagnosis not present

## 2024-04-21 DIAGNOSIS — R9089 Other abnormal findings on diagnostic imaging of central nervous system: Secondary | ICD-10-CM | POA: Diagnosis not present

## 2024-04-29 DIAGNOSIS — M25642 Stiffness of left hand, not elsewhere classified: Secondary | ICD-10-CM | POA: Diagnosis not present

## 2024-05-05 DIAGNOSIS — Z853 Personal history of malignant neoplasm of breast: Secondary | ICD-10-CM | POA: Diagnosis not present

## 2024-05-05 DIAGNOSIS — Z1231 Encounter for screening mammogram for malignant neoplasm of breast: Secondary | ICD-10-CM | POA: Diagnosis not present

## 2024-05-05 DIAGNOSIS — Z9011 Acquired absence of right breast and nipple: Secondary | ICD-10-CM | POA: Diagnosis not present

## 2024-05-05 DIAGNOSIS — Z0489 Encounter for examination and observation for other specified reasons: Secondary | ICD-10-CM | POA: Diagnosis not present

## 2024-05-06 DIAGNOSIS — M0589 Other rheumatoid arthritis with rheumatoid factor of multiple sites: Secondary | ICD-10-CM | POA: Diagnosis not present

## 2024-05-12 DIAGNOSIS — M25642 Stiffness of left hand, not elsewhere classified: Secondary | ICD-10-CM | POA: Diagnosis not present

## 2024-05-15 DIAGNOSIS — Z808 Family history of malignant neoplasm of other organs or systems: Secondary | ICD-10-CM | POA: Diagnosis not present

## 2024-05-15 DIAGNOSIS — Z86000 Personal history of in-situ neoplasm of breast: Secondary | ICD-10-CM | POA: Diagnosis not present

## 2024-05-15 DIAGNOSIS — Z801 Family history of malignant neoplasm of trachea, bronchus and lung: Secondary | ICD-10-CM | POA: Diagnosis not present

## 2024-05-15 DIAGNOSIS — Z6820 Body mass index (BMI) 20.0-20.9, adult: Secondary | ICD-10-CM | POA: Diagnosis not present

## 2024-05-15 DIAGNOSIS — Z803 Family history of malignant neoplasm of breast: Secondary | ICD-10-CM | POA: Diagnosis not present

## 2024-05-15 DIAGNOSIS — Z779 Other contact with and (suspected) exposures hazardous to health: Secondary | ICD-10-CM | POA: Diagnosis not present

## 2024-05-15 DIAGNOSIS — Z853 Personal history of malignant neoplasm of breast: Secondary | ICD-10-CM | POA: Diagnosis not present

## 2024-05-15 DIAGNOSIS — Z8051 Family history of malignant neoplasm of kidney: Secondary | ICD-10-CM | POA: Diagnosis not present

## 2024-05-15 DIAGNOSIS — Z1151 Encounter for screening for human papillomavirus (HPV): Secondary | ICD-10-CM | POA: Diagnosis not present

## 2024-05-15 DIAGNOSIS — Z1272 Encounter for screening for malignant neoplasm of vagina: Secondary | ICD-10-CM | POA: Diagnosis not present

## 2024-05-15 DIAGNOSIS — Z800 Family history of malignant neoplasm of digestive organs: Secondary | ICD-10-CM | POA: Diagnosis not present

## 2024-05-15 DIAGNOSIS — R300 Dysuria: Secondary | ICD-10-CM | POA: Diagnosis not present

## 2024-06-04 ENCOUNTER — Encounter: Payer: Self-pay | Admitting: Cardiovascular Disease

## 2024-06-10 ENCOUNTER — Ambulatory Visit (INDEPENDENT_AMBULATORY_CARE_PROVIDER_SITE_OTHER): Admitting: Otolaryngology

## 2024-06-10 ENCOUNTER — Encounter (INDEPENDENT_AMBULATORY_CARE_PROVIDER_SITE_OTHER): Payer: Self-pay | Admitting: Otolaryngology

## 2024-06-10 VITALS — BP 124/65 | HR 60

## 2024-06-10 DIAGNOSIS — J338 Other polyp of sinus: Secondary | ICD-10-CM

## 2024-06-10 DIAGNOSIS — J310 Chronic rhinitis: Secondary | ICD-10-CM | POA: Diagnosis not present

## 2024-06-10 DIAGNOSIS — R0981 Nasal congestion: Secondary | ICD-10-CM

## 2024-06-11 NOTE — Progress Notes (Signed)
 Patient ID: Patricia Long, female   DOB: [birth year], 73 y.o.   MRN: [id number]  Follow-up: Chronic rhinitis, left nasal polyp  HPI: The patient is a 72 year old female who returns today for her follow-up evaluation.  The patient has a history of chronic rhinitis and left nasal polyp.  The polyp was removed by Dr. Llewellyn at [facility].  At her last visit in March 2025, she was noted to have diffuse nasal mucosal congestion, consistent with chronic rhinitis.  She was treated with Flonase nasal spray as needed.  The patient returns today reporting no significant difficulty since her last visit.  Currently she denies any facial pain, fever, or visual change.  She is able to breathe through both nostrils.  Exam: General: Communicates without difficulty, well nourished, no acute distress. Head: Normocephalic, no evidence injury, no tenderness, facial buttresses intact without stepoff. Face/sinus: No tenderness to palpation and percussion. Facial movement is normal and symmetric. Eyes: PERRL, EOMI. No scleral icterus, conjunctivae clear. Neuro: CN II exam reveals vision grossly intact.  No nystagmus at any point of gaze. Ears: Auricles well formed without lesions.  Ear canals are intact without mass or lesion.  No erythema or edema is appreciated.  The TMs are intact without fluid. Nose: External evaluation reveals normal support and skin without lesions.  Dorsum is intact.  Anterior rhinoscopy reveals congested mucosa over anterior aspect of inferior turbinates and intact septum.  No purulence noted. Oral:  Oral cavity and oropharynx are intact, symmetric, without erythema or edema.  Mucosa is moist without lesions. Neck: Full range of motion without pain.  There is no significant lymphadenopathy.  No masses palpable.  Thyroid  bed within normal limits to palpation.  Parotid glands and submandibular glands equal bilaterally without mass.  Trachea is midline. Neuro:  CN 2-12 grossly intact.    Assessment: 1.  Chronic rhinitis with mild nasal mucosal congestion. 2.  No recurrent polyposis or acute infection is noted on today's examination.  Plan: 1.  The physical exam findings are reviewed with the patient. 2.  Continue with Flonase nasal spray and nasal saline irrigation as needed. 3.  Patient will return for reevaluation in 1 year.

## 2024-06-18 DIAGNOSIS — M0589 Other rheumatoid arthritis with rheumatoid factor of multiple sites: Secondary | ICD-10-CM | POA: Diagnosis not present

## 2024-06-25 DIAGNOSIS — D225 Melanocytic nevi of trunk: Secondary | ICD-10-CM | POA: Diagnosis not present

## 2024-06-25 DIAGNOSIS — Z1283 Encounter for screening for malignant neoplasm of skin: Secondary | ICD-10-CM | POA: Diagnosis not present

## 2024-06-25 DIAGNOSIS — L820 Inflamed seborrheic keratosis: Secondary | ICD-10-CM | POA: Diagnosis not present

## 2024-07-24 ENCOUNTER — Encounter: Payer: Self-pay | Admitting: Cardiovascular Disease

## 2024-07-24 DIAGNOSIS — I700 Atherosclerosis of aorta: Secondary | ICD-10-CM

## 2024-07-24 DIAGNOSIS — E785 Hyperlipidemia, unspecified: Secondary | ICD-10-CM

## 2024-07-24 DIAGNOSIS — E7800 Pure hypercholesterolemia, unspecified: Secondary | ICD-10-CM

## 2024-07-24 MED ORDER — REPATHA SURECLICK 140 MG/ML ~~LOC~~ SOAJ: 1.0000 mL | SUBCUTANEOUS | 0 refills | Status: DC

## 2024-07-30 DIAGNOSIS — M0589 Other rheumatoid arthritis with rheumatoid factor of multiple sites: Secondary | ICD-10-CM | POA: Diagnosis not present

## 2024-07-30 DIAGNOSIS — R7989 Other specified abnormal findings of blood chemistry: Secondary | ICD-10-CM | POA: Diagnosis not present

## 2024-08-05 ENCOUNTER — Other Ambulatory Visit (HOSPITAL_COMMUNITY): Payer: Self-pay

## 2024-08-05 ENCOUNTER — Telehealth: Payer: Self-pay | Admitting: Pharmacy Technician

## 2024-08-05 NOTE — Telephone Encounter (Signed)
 Pharmacy Patient Advocate Encounter   Received notification from Physician's Office that prior authorization for repatha  is required/requested.   Insurance verification completed.   The patient is insured through [employer].   Per test claim: PA required; PA submitted to above mentioned insurance via Latent Key/confirmation #/EOC [id number] Status is pending

## 2024-08-05 NOTE — Telephone Encounter (Signed)
 Pharmacy Patient Advocate Encounter  Received notification from [facility] that Prior Authorization for repatha  has been APPROVED from 08/05/24 to 02/02/25. Ran test claim, Copay is $141.00- 3 months. This test claim was processed through [facility]- copay amounts may vary at other pharmacies due to pharmacy/plan contracts, or as the patient moves through the different stages of their insurance plan.   PA #/Case ID/Reference #: [id number]

## 2024-08-11 DIAGNOSIS — Z6820 Body mass index (BMI) 20.0-20.9, adult: Secondary | ICD-10-CM | POA: Diagnosis not present

## 2024-08-11 DIAGNOSIS — G5793 Unspecified mononeuropathy of bilateral lower limbs: Secondary | ICD-10-CM | POA: Diagnosis not present

## 2024-08-11 DIAGNOSIS — H52223 Regular astigmatism, bilateral: Secondary | ICD-10-CM | POA: Diagnosis not present

## 2024-08-11 DIAGNOSIS — H524 Presbyopia: Secondary | ICD-10-CM | POA: Diagnosis not present

## 2024-08-11 DIAGNOSIS — M0589 Other rheumatoid arthritis with rheumatoid factor of multiple sites: Secondary | ICD-10-CM | POA: Diagnosis not present

## 2024-08-11 DIAGNOSIS — H401131 Primary open-angle glaucoma, bilateral, mild stage: Secondary | ICD-10-CM | POA: Diagnosis not present

## 2024-08-11 DIAGNOSIS — Z79899 Other long term (current) drug therapy: Secondary | ICD-10-CM | POA: Diagnosis not present

## 2024-08-11 DIAGNOSIS — H5201 Hypermetropia, right eye: Secondary | ICD-10-CM | POA: Diagnosis not present

## 2024-08-11 DIAGNOSIS — M1991 Primary osteoarthritis, unspecified site: Secondary | ICD-10-CM | POA: Diagnosis not present

## 2024-08-18 DIAGNOSIS — G6289 Other specified polyneuropathies: Secondary | ICD-10-CM | POA: Diagnosis not present

## 2024-08-18 DIAGNOSIS — Z5181 Encounter for therapeutic drug level monitoring: Secondary | ICD-10-CM | POA: Diagnosis not present

## 2024-09-08 DIAGNOSIS — M898X7 Other specified disorders of bone, ankle and foot: Secondary | ICD-10-CM | POA: Diagnosis not present

## 2024-09-08 DIAGNOSIS — M19071 Primary osteoarthritis, right ankle and foot: Secondary | ICD-10-CM | POA: Diagnosis not present

## 2024-09-08 DIAGNOSIS — Z981 Arthrodesis status: Secondary | ICD-10-CM | POA: Diagnosis not present

## 2024-09-08 DIAGNOSIS — M0579 Rheumatoid arthritis with rheumatoid factor of multiple sites without organ or systems involvement: Secondary | ICD-10-CM | POA: Diagnosis not present

## 2024-09-08 DIAGNOSIS — X58XXXA Exposure to other specified factors, initial encounter: Secondary | ICD-10-CM | POA: Diagnosis not present

## 2024-09-08 DIAGNOSIS — L851 Acquired keratosis [keratoderma] palmaris et plantaris: Secondary | ICD-10-CM | POA: Diagnosis not present

## 2024-09-08 DIAGNOSIS — S93511A Sprain of interphalangeal joint of right great toe, initial encounter: Secondary | ICD-10-CM | POA: Diagnosis not present

## 2024-09-10 DIAGNOSIS — M0589 Other rheumatoid arthritis with rheumatoid factor of multiple sites: Secondary | ICD-10-CM | POA: Diagnosis not present

## 2024-10-21 ENCOUNTER — Other Ambulatory Visit: Payer: Self-pay | Admitting: Cardiovascular Disease

## 2024-10-21 DIAGNOSIS — E7800 Pure hypercholesterolemia, unspecified: Secondary | ICD-10-CM

## 2024-10-21 DIAGNOSIS — E785 Hyperlipidemia, unspecified: Secondary | ICD-10-CM

## 2024-10-21 DIAGNOSIS — I700 Atherosclerosis of aorta: Secondary | ICD-10-CM

## 2024-10-27 ENCOUNTER — Ambulatory Visit: Admitting: Cardiovascular Disease

## 2025-03-02 ENCOUNTER — Ambulatory Visit: Admitting: Cardiovascular Disease
